# Patient Record
Sex: Female | Born: 1975 | Race: White | Hispanic: No | Smoking: Former smoker
Health system: Southern US, Community
[De-identification: ages and names within clinical notes are randomized; demographics above are authoritative.]

## PROBLEM LIST (undated history)

## (undated) DIAGNOSIS — F329 Major depressive disorder, single episode, unspecified: Secondary | ICD-10-CM

## (undated) DIAGNOSIS — K219 Gastro-esophageal reflux disease without esophagitis: Secondary | ICD-10-CM

## (undated) DIAGNOSIS — F32A Depression, unspecified: Secondary | ICD-10-CM

## (undated) DIAGNOSIS — E119 Type 2 diabetes mellitus without complications: Secondary | ICD-10-CM

## (undated) DIAGNOSIS — F341 Dysthymic disorder: Secondary | ICD-10-CM

## (undated) DIAGNOSIS — T7840XA Allergy, unspecified, initial encounter: Secondary | ICD-10-CM

## (undated) DIAGNOSIS — E785 Hyperlipidemia, unspecified: Secondary | ICD-10-CM

## (undated) DIAGNOSIS — F419 Anxiety disorder, unspecified: Secondary | ICD-10-CM

## (undated) DIAGNOSIS — J45909 Unspecified asthma, uncomplicated: Secondary | ICD-10-CM

## (undated) DIAGNOSIS — I10 Essential (primary) hypertension: Secondary | ICD-10-CM

## (undated) HISTORY — DX: Dysthymic disorder: F34.1

## (undated) HISTORY — PX: ABDOMINAL HYSTERECTOMY: SHX81

## (undated) HISTORY — DX: Hyperlipidemia, unspecified: E78.5

## (undated) HISTORY — DX: Allergy, unspecified, initial encounter: T78.40XA

---

## 2010-06-04 HISTORY — PX: OTHER SURGICAL HISTORY: SHX169

## 2014-06-04 DIAGNOSIS — F341 Dysthymic disorder: Secondary | ICD-10-CM

## 2014-06-04 HISTORY — DX: Dysthymic disorder: F34.1

## 2015-04-25 ENCOUNTER — Encounter: Payer: Self-pay | Admitting: Emergency Medicine

## 2015-04-25 ENCOUNTER — Emergency Department
Admission: EM | Admit: 2015-04-25 | Discharge: 2015-04-25 | Disposition: A | Discharge status: Home / Self Care | Payer: Medicaid - Out of State | Attending: Emergency Medicine | Admitting: Emergency Medicine

## 2015-04-25 ENCOUNTER — Emergency Department: Payer: Medicaid - Out of State

## 2015-04-25 DIAGNOSIS — E119 Type 2 diabetes mellitus without complications: Secondary | ICD-10-CM | POA: Insufficient documentation

## 2015-04-25 DIAGNOSIS — J45901 Unspecified asthma with (acute) exacerbation: Secondary | ICD-10-CM | POA: Insufficient documentation

## 2015-04-25 DIAGNOSIS — J069 Acute upper respiratory infection, unspecified: Secondary | ICD-10-CM | POA: Insufficient documentation

## 2015-04-25 DIAGNOSIS — Z3202 Encounter for pregnancy test, result negative: Secondary | ICD-10-CM | POA: Diagnosis not present

## 2015-04-25 DIAGNOSIS — R05 Cough: Secondary | ICD-10-CM | POA: Diagnosis present

## 2015-04-25 DIAGNOSIS — J45909 Unspecified asthma, uncomplicated: Secondary | ICD-10-CM

## 2015-04-25 HISTORY — DX: Unspecified asthma, uncomplicated: J45.909

## 2015-04-25 HISTORY — DX: Type 2 diabetes mellitus without complications: E11.9

## 2015-04-25 LAB — POCT PREGNANCY, URINE: PREG TEST UR: NEGATIVE

## 2015-04-25 MED ORDER — IPRATROPIUM-ALBUTEROL 0.5-2.5 (3) MG/3ML IN SOLN
3.0000 mL | Freq: Once | RESPIRATORY_TRACT | Status: AC
  Administered 2015-04-25: 3 mL via RESPIRATORY_TRACT
  Filled 2015-04-25: qty 3

## 2015-04-25 MED ORDER — ALBUTEROL SULFATE HFA 108 (90 BASE) MCG/ACT IN AERS: 2.0000 | INHALATION_SPRAY | Freq: Four times a day (QID) | RESPIRATORY_TRACT | Status: AC | PRN

## 2015-04-25 MED ORDER — BENZONATATE 100 MG PO CAPS: 100.0000 mg | ORAL_CAPSULE | Freq: Three times a day (TID) | ORAL | Status: DC | PRN

## 2015-04-25 NOTE — ED Provider Notes (Signed)
ED ECG REPORT I, Jene EveryKINNER, Daymon Hora, the attending physician, personally viewed and interpreted this ECG.  Date: 04/25/2015 EKG Time: 1154 Rate: 83 Rhythm: normal sinus rhythm QRS Axis: normal Intervals: normal ST/T Wave abnormalities: normal Conduction Disutrbances: none Narrative Interpretation: unremarkable   Jene Everyobert Jeily Guthridge, MD 04/25/15 1155

## 2015-04-25 NOTE — ED Notes (Signed)
Pt reports that she began having cough/congestion around midnight last night. Pt reports that she has been having pains in her chest since she began the cough.

## 2015-04-25 NOTE — ED Provider Notes (Signed)
[facility] Emergency Department Provider Note  ____________________________________________  Time seen: Approximately 12:13 PM  I have reviewed the triage vital signs and the nursing notes.   HISTORY  Chief Complaint Cough and Nasal Congestion  HPI Virginia Peterson is a 39 y.o. female is here with complaint of cough and congestion since midnight last night. Patient states she is having pain in her chest since her cough began. Cough is mostly nonproductive. She is unaware of any fever or chills. She is somewhat short of breath. In the past she has had a history of asthma and has been using an inhaler but does not have one at this time. Patient currently is living in this area but only for one week. She does not have a primary care doctor in this area. She states that she is going home to stay for 3 weeks and then will return. She was given information about [facility] and plans to call and make an appointment in the future. Currently patient does not have an inhaler. She denies smoking. Currently she rates her pain 9 out of 10.   Past Medical History  Diagnosis Date  . Diabetes mellitus without complication (HCC)   . Asthma     There are no active problems to display for this patient.   History reviewed. No pertinent past surgical history.  Current Outpatient Rx  Name  Route  Sig  Dispense  Refill  . albuterol (PROVENTIL HFA;VENTOLIN HFA) 108 (90 BASE) MCG/ACT inhaler   Inhalation   Inhale 2 puffs into the lungs every 6 (six) hours as needed for wheezing or shortness of breath.   1 Inhaler   2   . benzonatate (TESSALON PERLES) 100 MG capsule   Oral   Take 1 capsule (100 mg total) by mouth 3 (three) times daily as needed for cough.   30 capsule   0     Allergies Review of patient's allergies indicates no known allergies.  History reviewed. No pertinent family history.  Social History Social History  Substance Use Topics  . Smoking status:  Never Smoker   . Smokeless tobacco: None  . Alcohol Use: Yes    Review of Systems Constitutional: No fever/chills ENT: No sore throat. Cardiovascular: Positive chest pain. Respiratory: Positive shortness of breath. Gastrointestinal: No abdominal pain.  No nausea, no vomiting.   Genitourinary: Negative for dysuria. Musculoskeletal: Negative for back pain. Skin: Negative for rash. Neurological: Negative for headaches, focal weakness or numbness.  10-point ROS otherwise negative.  ____________________________________________   PHYSICAL EXAM:  VITAL SIGNS: ED Triage Vitals  Enc Vitals Group     BP 04/25/15 1148 155/84 mmHg     Pulse Rate 04/25/15 1148 84     Resp 04/25/15 1148 20     Temp 04/25/15 1148 98.1 F (36.7 C)     Temp Source 04/25/15 1148 Oral     SpO2 04/25/15 1148 97 %     Weight 04/25/15 1148 220 lb (99.791 kg)     Height 04/25/15 1148  (1.549 m)     Head Cir --      Peak Flow --      Pain Score 04/25/15 1149 9     Pain Loc --      Pain Edu? --      Excl. in GC? --     Constitutional: Alert and oriented. Well appearing and in no acute distress. Eyes: Conjunctivae are normal. PERRL. EOMI. Head: Atraumatic. Nose: No congestion/rhinnorhea. Mouth/Throat:  Mucous membranes are moist.  Oropharynx non-erythematous. Neck: No stridor.  Supple. Hematological/Lymphatic/Immunilogical: No cervical lymphadenopathy. Cardiovascular: Normal rate, regular rhythm. Grossly normal heart sounds.  Good peripheral circulation. Respiratory: Normal respiratory effort.  No retractions. Lungs poor air exchange is heard but no wheezes or rales noted. Gastrointestinal: Soft and nontender. No distention.  Musculoskeletal: No lower extremity tenderness nor edema.  No joint effusions. Neurologic:  Normal speech and language. No gross focal neurologic deficits are appreciated. No gait instability. Skin:  Skin is warm, dry and intact. No rash noted. Psychiatric: Mood and affect are  normal. Speech and behavior are normal.  ____________________________________________   LABS (all labs ordered are listed, but only abnormal results are displayed)  Labs Reviewed  POC URINE PREG, ED  POCT PREGNANCY, URINE   ____________________________________________  EKG  Per Dr. Cyril LoosenKinner ____________________________________________  RADIOLOGY  Chest x-ray per radiologist shows no acute cardiopulmonary disease. I, Tommi Rumpshonda L Summers, personally viewed and evaluated these images (plain radiographs) as part of my medical decision making.  ____________________________________________   PROCEDURES  Procedure(s) performed: None  Critical Care performed: No  ____________________________________________   INITIAL IMPRESSION / ASSESSMENT AND PLAN / ED COURSE  Pertinent labs & imaging results that were available during my care of the patient were reviewed by me and considered in my medical decision making (see chart for details).  After DuoNeb treatment patient was breathing with decreased cough and feels like she is less tight in her chest. There was still no wheezing noted on exam. Patient was written a prescription for a albuterol inhaler and Tessalon Perles as needed for cough. She is follow-up with her doctor at home. She is instructed to return to the emergency room if any severe worsening of her symptoms.   FINAL CLINICAL IMPRESSION(S) / ED DIAGNOSES  Final diagnoses:  Acute upper respiratory infection  Asthma, unspecified asthma severity, uncomplicated      Tommi RumpsRhonda L Summers, PA-C 04/25/15 1437  Jene Everyobert Kinner, MD 04/25/15 [phone] [id number]

## 2015-04-25 NOTE — ED Notes (Addendum)
Pt to ed with c/o cough, congestion and chest tightness with cough x 2 days.  Pt denies fever, denies body aches. Pt alert and oriented and appears in no respiratory distress.  Pt also reports mild sore throat and bilat earaches.

## 2015-04-25 NOTE — Discharge Instructions (Signed)
Follow up with [facility].  You will need to call for an appointment. Albuterol inhaler as directed

## 2015-05-05 ENCOUNTER — Encounter: Payer: Self-pay | Admitting: General Practice

## 2015-05-05 ENCOUNTER — Emergency Department
Admission: EM | Admit: 2015-05-05 | Discharge: 2015-05-06 | Disposition: A | Discharge status: Home / Self Care | Payer: Medicaid Other | Attending: Emergency Medicine | Admitting: Emergency Medicine

## 2015-05-05 DIAGNOSIS — J209 Acute bronchitis, unspecified: Secondary | ICD-10-CM

## 2015-05-05 DIAGNOSIS — R739 Hyperglycemia, unspecified: Secondary | ICD-10-CM

## 2015-05-05 DIAGNOSIS — R05 Cough: Secondary | ICD-10-CM | POA: Diagnosis present

## 2015-05-05 DIAGNOSIS — J45909 Unspecified asthma, uncomplicated: Secondary | ICD-10-CM | POA: Diagnosis not present

## 2015-05-05 DIAGNOSIS — E1165 Type 2 diabetes mellitus with hyperglycemia: Secondary | ICD-10-CM | POA: Diagnosis not present

## 2015-05-05 DIAGNOSIS — I159 Secondary hypertension, unspecified: Secondary | ICD-10-CM

## 2015-05-05 LAB — GLUCOSE, CAPILLARY: GLUCOSE-CAPILLARY: 254 mg/dL — AB (ref 65–99)

## 2015-05-05 MED ORDER — GLIPIZIDE 10 MG PO TABS: 10.0000 mg | ORAL_TABLET | Freq: Every day | ORAL | Status: DC

## 2015-05-05 MED ORDER — GLIPIZIDE 10 MG PO TABS: 10.0000 mg | ORAL_TABLET | Freq: Two times a day (BID) | ORAL | Status: DC

## 2015-05-05 MED ORDER — METFORMIN HCL 500 MG PO TABS
1000.0000 mg | ORAL_TABLET | Freq: Once | ORAL | Status: AC
  Administered 2015-05-05: 1000 mg via ORAL
  Filled 2015-05-05: qty 2

## 2015-05-05 MED ORDER — AZITHROMYCIN 250 MG PO TABS: 500.0000 mg | ORAL_TABLET | Freq: Every day | ORAL | Status: AC

## 2015-05-05 MED ORDER — LISINOPRIL 10 MG PO TABS
10.0000 mg | ORAL_TABLET | Freq: Once | ORAL | Status: AC
  Administered 2015-05-05: 10 mg via ORAL
  Filled 2015-05-05: qty 1

## 2015-05-05 MED ORDER — GLIPIZIDE 10 MG PO TABS
ORAL_TABLET | ORAL | Status: AC
  Filled 2015-05-05: qty 1

## 2015-05-05 MED ORDER — BENZONATATE 100 MG PO CAPS: 100.0000 mg | ORAL_CAPSULE | Freq: Three times a day (TID) | ORAL | Status: DC | PRN

## 2015-05-05 MED ORDER — ACETAMINOPHEN-CODEINE #3 300-30 MG PO TABS
2.0000 | ORAL_TABLET | Freq: Once | ORAL | Status: AC
  Administered 2015-05-05: 2 via ORAL
  Filled 2015-05-05: qty 2

## 2015-05-05 MED ORDER — LISINOPRIL 10 MG PO TABS: 10.0000 mg | ORAL_TABLET | Freq: Every day | ORAL | Status: DC

## 2015-05-05 MED ORDER — GLIPIZIDE 10 MG PO TABS
10.0000 mg | ORAL_TABLET | Freq: Once | ORAL | Status: AC
  Administered 2015-05-05: 10 mg via ORAL

## 2015-05-05 MED ORDER — METFORMIN HCL 1000 MG PO TABS: 1000.0000 mg | ORAL_TABLET | Freq: Two times a day (BID) | ORAL | Status: DC

## 2015-05-05 MED ORDER — AZITHROMYCIN 250 MG PO TABS
ORAL_TABLET | ORAL | Status: AC
Start: 2015-05-05 — End: 2015-05-05
  Administered 2015-05-05: 500 mg via ORAL
  Filled 2015-05-05: qty 2

## 2015-05-05 MED ORDER — AZITHROMYCIN 250 MG PO TABS
500.0000 mg | ORAL_TABLET | Freq: Every day | ORAL | Status: DC
  Administered 2015-05-05: 500 mg via ORAL

## 2015-05-05 NOTE — Discharge Instructions (Signed)
Acute Bronchitis Bronchitis is inflammation of the airways that extend from the windpipe into the lungs (bronchi). The inflammation often causes mucus to develop. This leads to a cough, which is the most common symptom of bronchitis.  In acute bronchitis, the condition usually develops suddenly and goes away over time, usually in a couple weeks. Smoking, allergies, and asthma can make bronchitis worse. Repeated episodes of bronchitis may cause further lung problems.  CAUSES Acute bronchitis is most often caused by the same virus that causes a cold. The virus can spread from person to person (contagious) through coughing, sneezing, and touching contaminated objects. SIGNS AND SYMPTOMS   Cough.   Fever.   Coughing up mucus.   Body aches.   Chest congestion.   Chills.   Shortness of breath.   Sore throat.  DIAGNOSIS  Acute bronchitis is usually diagnosed through a physical exam. Your health care provider will also ask you questions about your medical history. Tests, such as chest X-rays, are sometimes done to rule out other conditions.  TREATMENT  Acute bronchitis usually goes away in a couple weeks. Oftentimes, no medical treatment is necessary. Medicines are sometimes given for relief of fever or cough. Antibiotic medicines are usually not needed but may be prescribed in certain situations. In some cases, an inhaler may be recommended to help reduce shortness of breath and control the cough. A cool mist vaporizer may also be used to help thin bronchial secretions and make it easier to clear the chest.  HOME CARE INSTRUCTIONS  Get plenty of rest.   Drink enough fluids to keep your urine clear or pale yellow (unless you have a medical condition that requires fluid restriction). Increasing fluids may help thin your respiratory secretions (sputum) and reduce chest congestion, and it will prevent dehydration.   Take medicines only as directed by your health care provider.  If  you were prescribed an antibiotic medicine, finish it all even if you start to feel better.  Avoid smoking and secondhand smoke. Exposure to cigarette smoke or irritating chemicals will make bronchitis worse. If you are a smoker, consider using nicotine gum or skin patches to help control withdrawal symptoms. Quitting smoking will help your lungs heal faster.   Reduce the chances of another bout of acute bronchitis by washing your hands frequently, avoiding people with cold symptoms, and trying not to touch your hands to your mouth, nose, or eyes.   Keep all follow-up visits as directed by your health care provider.  SEEK MEDICAL CARE IF: Your symptoms do not improve after 1 week of treatment.  SEEK IMMEDIATE MEDICAL CARE IF:  You develop an increased fever or chills.   You have chest pain.   You have severe shortness of breath.  You have bloody sputum.   You develop dehydration.  You faint or repeatedly feel like you are going to pass out.  You develop repeated vomiting.  You develop a severe headache. MAKE SURE YOU:   Understand these instructions.  Will watch your condition.  Will get help right away if you are not doing well or get worse.   This information is not intended to replace advice given to you by your health care provider. Make sure you discuss any questions you have with your health care provider.   Document Released: 06/28/2004 Document Revised: 06/11/2014 Document Reviewed: 11/11/2012 Elsevier Interactive Patient Education 2016 [employer] Inc[employer]  Hyperglycemia Hyperglycemia occurs when the glucose (sugar) in your blood is too high. Hyperglycemia can happen for many  reasons, but it most often happens to people who do not know they have diabetes or are not managing their diabetes properly.  CAUSES  Whether you have diabetes or not, there are other causes of hyperglycemia. Hyperglycemia can occur when you have diabetes, but it can also occur in other  situations that you might not be as aware of, such as: Diabetes  If you have diabetes and are having problems controlling your blood glucose, hyperglycemia could occur because of some of the following reasons:  Not following your meal plan.  Not taking your diabetes medications or not taking it properly.  Exercising less or doing less activity than you normally do.  Being sick. Pre-diabetes  This cannot be ignored. Before people develop Type 2 diabetes, they almost always have "pre-diabetes." This is when your blood glucose levels are higher than normal, but not yet high enough to be diagnosed as diabetes. Research has shown that some long-term damage to the body, especially the heart and circulatory system, may already be occurring during pre-diabetes. If you take action to manage your blood glucose when you have pre-diabetes, you may delay or prevent Type 2 diabetes from developing. Stress  If you have diabetes, you may be "diet" controlled or on oral medications or insulin to control your diabetes. However, you may find that your blood glucose is higher than usual in the hospital whether you have diabetes or not. This is often referred to as "stress hyperglycemia." Stress can elevate your blood glucose. This happens because of hormones put out by the body during times of stress. If stress has been the cause of your high blood glucose, it can be followed regularly by your caregiver. That way he/she can make sure your hyperglycemia does not continue to get worse or progress to diabetes. Steroids  Steroids are medications that act on the infection fighting system (immune system) to block inflammation or infection. One side effect can be a rise in blood glucose. Most people can produce enough extra insulin to allow for this rise, but for those who cannot, steroids make blood glucose levels go even higher. It is not unusual for steroid treatments to "uncover" diabetes that is developing. It is not  always possible to determine if the hyperglycemia will go away after the steroids are stopped. A special blood test called an A1c is sometimes done to determine if your blood glucose was elevated before the steroids were started. SYMPTOMS  Thirsty.  Frequent urination.  Dry mouth.  Blurred vision.  Tired or fatigue.  Weakness.  Sleepy.  Tingling in feet or leg. DIAGNOSIS  Diagnosis is made by monitoring blood glucose in one or all of the following ways:  A1c test. This is a chemical found in your blood.  Fingerstick blood glucose monitoring.  Laboratory results. TREATMENT  First, knowing the cause of the hyperglycemia is important before the hyperglycemia can be treated. Treatment may include, but is not be limited to:  Education.  Change or adjustment in medications.  Change or adjustment in meal plan.  Treatment for an illness, infection, etc.  More frequent blood glucose monitoring.  Change in exercise plan.  Decreasing or stopping steroids.  Lifestyle changes. HOME CARE INSTRUCTIONS   Test your blood glucose as directed.  Exercise regularly. Your caregiver will give you instructions about exercise. Pre-diabetes or diabetes which comes on with stress is helped by exercising.  Eat wholesome, balanced meals. Eat often and at regular, fixed times. Your caregiver or nutritionist will give you a  meal plan to guide your sugar intake.  Being at an ideal weight is important. If needed, losing as little as 10 to 15 pounds may help improve blood glucose levels. SEEK MEDICAL CARE IF:   You have questions about medicine, activity, or diet.  You continue to have symptoms (problems such as increased thirst, urination, or weight gain). SEEK IMMEDIATE MEDICAL CARE IF:   You are vomiting or have diarrhea.  Your breath smells fruity.  You are breathing faster or slower.  You are very sleepy or incoherent.  You have numbness, tingling, or pain in your feet or  hands.  You have chest pain.  Your symptoms get worse even though you have been following your caregiver's orders.  If you have any other questions or concerns.   This information is not intended to replace advice given to you by your health care provider. Make sure you discuss any questions you have with your health care provider.   Document Released: 11/14/2000 Document Revised: 08/13/2011 Document Reviewed: 01/25/2015 Elsevier Interactive Patient Education 2016 [employer]  Hypertension Hypertension, commonly called high blood pressure, is when the force of blood pumping through your arteries is too strong. Your arteries are the blood vessels that carry blood from your heart throughout your body. A blood pressure reading consists of a higher number over a lower number, such as 110/72. The higher number (systolic) is the pressure inside your arteries when your heart pumps. The lower number (diastolic) is the pressure inside your arteries when your heart relaxes. Ideally you want your blood pressure below 120/80. Hypertension forces your heart to work harder to pump blood. Your arteries may become narrow or stiff. Having untreated or uncontrolled hypertension can cause heart attack, stroke, kidney disease, and other problems. RISK FACTORS Some risk factors for high blood pressure are controllable. Others are not.  Risk factors you cannot control include:   Race. You may be at higher risk if you are African American.  Age. Risk increases with age.  Gender. Men are at higher risk than women before age 39 years. After age 39, women are at higher risk than men. Risk factors you can control include:  Not getting enough exercise or physical activity.  Being overweight.  Getting too much fat, sugar, calories, or salt in your diet.  Drinking too much alcohol. SIGNS AND SYMPTOMS Hypertension does not usually cause signs or symptoms. Extremely high blood pressure (hypertensive crisis)  may cause headache, anxiety, shortness of breath, and nosebleed. DIAGNOSIS To check if you have hypertension, your health care provider will measure your blood pressure while you are seated, with your arm held at the level of your heart. It should be measured at least twice using the same arm. Certain conditions can cause a difference in blood pressure between your right and left arms. A blood pressure reading that is higher than normal on one occasion does not mean that you need treatment. If it is not clear whether you have high blood pressure, you may be asked to return on a different day to have your blood pressure checked again. Or, you may be asked to monitor your blood pressure at home for 1 or more weeks. TREATMENT Treating high blood pressure includes making lifestyle changes and possibly taking medicine. Living a healthy lifestyle can help lower high blood pressure. You may need to change some of your habits. Lifestyle changes may include:  Following the DASH diet. This diet is high in fruits, vegetables, and whole grains. It is  low in salt, red meat, and added sugars.  Keep your sodium intake below 2,300 mg per day.  Getting at least 30-45 minutes of aerobic exercise at least 4 times per week.  Losing weight if necessary.  Not smoking.  Limiting alcoholic beverages.  Learning ways to reduce stress. Your health care provider may prescribe medicine if lifestyle changes are not enough to get your blood pressure under control, and if one of the following is true:  You are 25-26 years of age and your systolic blood pressure is above 140.  You are 34 years of age or older, and your systolic blood pressure is above 150.  Your diastolic blood pressure is above 90.  You have diabetes, and your systolic blood pressure is over 140 or your diastolic blood pressure is over 90.  You have kidney disease and your blood pressure is above 140/90.  You have heart disease and your blood  pressure is above 140/90. Your personal target blood pressure may vary depending on your medical conditions, your age, and other factors. HOME CARE INSTRUCTIONS  Have your blood pressure rechecked as directed by your health care provider.   Take medicines only as directed by your health care provider. Follow the directions carefully. Blood pressure medicines must be taken as prescribed. The medicine does not work as well when you skip doses. Skipping doses also puts you at risk for problems.  Do not smoke.   Monitor your blood pressure at home as directed by your health care provider. SEEK MEDICAL CARE IF:   You think you are having a reaction to medicines taken.  You have recurrent headaches or feel dizzy.  You have swelling in your ankles.  You have trouble with your vision. SEEK IMMEDIATE MEDICAL CARE IF:  You develop a severe headache or confusion.  You have unusual weakness, numbness, or feel faint.  You have severe chest or abdominal pain.  You vomit repeatedly.  You have trouble breathing. MAKE SURE YOU:   Understand these instructions.  Will watch your condition.  Will get help right away if you are not doing well or get worse.   This information is not intended to replace advice given to you by your health care provider. Make sure you discuss any questions you have with your health care provider.   Document Released: 05/21/2005 Document Revised: 10/05/2014 Document Reviewed: 03/13/2013 Elsevier Interactive Patient Education [employer]

## 2015-05-05 NOTE — ED Notes (Signed)

## 2015-05-05 NOTE — ED Notes (Addendum)
Pt to ED for persistent cough and congestion for over 1 week with Tessalon and Albuterol therapy. [place] been [place] from [place] and is without her BP meds.

## 2015-05-05 NOTE — ED Notes (Signed)
MD at bedside. 

## 2015-05-05 NOTE — ED Provider Notes (Signed)
[facility] Emergency Department Provider Note  ____________________________________________  Time seen: 11:10 PM  I have reviewed the triage vital signs and the nursing notes.   HISTORY  Chief Complaint Cough and Nasal Congestion    HPI Virginia Peterson is a 39 y.o. female presents with history of persistent cough and congestion times one week that is unrelieved with Tessalon and albuterol therapy. Patient states that she is visiting from [place] and is also out of multiple of her medications including her glipizide metformin and lisinopril. Patient states that her last dose of those medications was August. Patient denies any fever and was afebrile on presentation to emergency department with a temperature 90.8     Past Medical History  Diagnosis Date  . Diabetes mellitus without complication (HCC)   . Asthma     There are no active problems to display for this patient.   History reviewed. No pertinent past surgical history.  Current Outpatient Rx  Name  Route  Sig  Dispense  Refill  . albuterol (PROVENTIL HFA;VENTOLIN HFA) 108 (90 BASE) MCG/ACT inhaler   Inhalation   Inhale 2 puffs into the lungs every 6 (six) hours as needed for wheezing or shortness of breath.   1 Inhaler   2   . benzonatate (TESSALON PERLES) 100 MG capsule   Oral   Take 1 capsule (100 mg total) by mouth 3 (three) times daily as needed for cough.   30 capsule   0     Allergies Review of patient's allergies indicates no known allergies.  History reviewed. No pertinent family history.  Social History Social History  Substance Use Topics  . Smoking status: Never Smoker   . Smokeless tobacco: None  . Alcohol Use: Yes    Review of Systems  Constitutional: Negative for fever. Eyes: Negative for visual changes. ENT: Negative for sore throat. Cardiovascular: Negative for chest pain. Respiratory: Positive for cough and congestion Gastrointestinal: Negative for  abdominal pain, vomiting and diarrhea. Genitourinary: Negative for dysuria. Musculoskeletal: Negative for back pain. Skin: Negative for rash. Neurological: Negative for headaches, focal weakness or numbness.   10-point ROS otherwise negative.  ____________________________________________   PHYSICAL EXAM:  VITAL SIGNS: ED Triage Vitals  Enc Vitals Group     BP 05/05/15 1949 205/117 mmHg     Pulse Rate 05/05/15 1949 98     Resp 05/05/15 1949 20     Temp 05/05/15 1949 98 F (36.7 C)     Temp Source 05/05/15 1949 Oral     SpO2 05/05/15 1949 99 %     Weight 05/05/15 1949 220 lb (99.791 kg)     Height 05/05/15 1949 5\' 1"  (1.549 m)     Head Cir --      Peak Flow --      Pain Score 05/05/15 1951 9     Pain Loc --      Pain Edu? --      Excl. in GC? --      Constitutional: Alert and oriented. Well appearing and in no distress. Eyes: Conjunctivae are normal. PERRL. Normal extraocular movements. ENT   Head: Normocephalic and atraumatic.   Nose: No congestion/rhinnorhea.   Mouth/Throat: Mucous membranes are moist.   Neck: No stridor. Hematological/Lymphatic/Immunilogical: No cervical lymphadenopathy. Cardiovascular: Normal rate, regular rhythm. Normal and symmetric distal pulses are present in all extremities. No murmurs, rubs, or gallops. Respiratory: Normal respiratory effort without tachypnea nor retractions. Breath sounds are clear and equal bilaterally. No wheezes/rales/rhonchi. Gastrointestinal: Soft  and nontender. No distention. There is no CVA tenderness. Genitourinary: deferred Musculoskeletal: Nontender with normal range of motion in all extremities. No joint effusions.  No lower extremity tenderness nor edema. Neurologic:  Normal speech and language. No gross focal neurologic deficits are appreciated. Speech is normal.  Skin:  Skin is warm, dry and intact. No rash noted. Psychiatric: Mood and affect are normal. Speech and behavior are normal. Patient  exhibits appropriate insight and judgment.    INITIAL IMPRESSION / ASSESSMENT AND PLAN / ED COURSE  Pertinent labs & imaging results that were available during my care of the patient were reviewed by me and considered in my medical decision making (see chart for details).  Patient's glucose 254 she received glipizide 10 mg and metformin 1000 mg her prescribed dosages. In addition patient received lisinopril 10 mg tablet. Patient was prescribed MEDICATIONS upon discharge.  ____________________________________________   FINAL CLINICAL IMPRESSION(S) / ED DIAGNOSES  Final diagnoses:  Acute bronchitis, unspecified organism  Hyperglycemia  Secondary hypertension, unspecified      Darci Current, MD 05/06/15 [phone]

## 2015-07-21 ENCOUNTER — Emergency Department
Admission: EM | Admit: 2015-07-21 | Discharge: 2015-07-21 | Disposition: A | Discharge status: Home / Self Care | Payer: Medicaid Other | Attending: Emergency Medicine | Admitting: Emergency Medicine

## 2015-07-21 ENCOUNTER — Encounter: Payer: Self-pay | Admitting: *Deleted

## 2015-07-21 DIAGNOSIS — Z79899 Other long term (current) drug therapy: Secondary | ICD-10-CM | POA: Insufficient documentation

## 2015-07-21 DIAGNOSIS — M545 Low back pain: Secondary | ICD-10-CM | POA: Diagnosis not present

## 2015-07-21 DIAGNOSIS — E119 Type 2 diabetes mellitus without complications: Secondary | ICD-10-CM | POA: Diagnosis not present

## 2015-07-21 DIAGNOSIS — G8929 Other chronic pain: Secondary | ICD-10-CM | POA: Diagnosis not present

## 2015-07-21 DIAGNOSIS — Z7984 Long term (current) use of oral hypoglycemic drugs: Secondary | ICD-10-CM | POA: Insufficient documentation

## 2015-07-21 HISTORY — DX: Anxiety disorder, unspecified: F41.9

## 2015-07-21 HISTORY — DX: Major depressive disorder, single episode, unspecified: F32.9

## 2015-07-21 HISTORY — DX: Depression, unspecified: F32.A

## 2015-07-21 MED ORDER — MELOXICAM 15 MG PO TABS: 15.0000 mg | ORAL_TABLET | Freq: Every day | ORAL | Status: DC

## 2015-07-21 MED ORDER — BACLOFEN 10 MG PO TABS: 10.0000 mg | ORAL_TABLET | Freq: Three times a day (TID) | ORAL | Status: DC

## 2015-07-21 NOTE — ED Provider Notes (Signed)
[facility] Emergency Department Provider Note  ____________________________________________  Time seen: Approximately 1:54 PM  I have reviewed the triage vital signs and the nursing notes.   HISTORY  Chief Complaint Back Pain    HPI Virginia Peterson is a 40 y.o. female , NAD, presents to the emergency department with chronic lower back pain. States she has recently moved to [place] from [place] to live with her mother. Has been taking tramadol for her bulging disc in her lower back which was prescribed by her primary care physician in [place]. States she tried to you refill and such but was denied by that physician. States she feels a "swelling" in her lower back. Has had no fevers, chills, body aches. Denies numbness, weakness, tingling. Denies any saddle paresthesias. Has had no loss of bowel or bladder control. Notes she is sleeping on a pullout couch and the metal bar supporting the mattress pushes into her back. States she is in the process of filing for Medicaid and disability in the state of [place]. Currently has [place] health care insurance through the end of this month and would like to establish with a primary care provider before that time.   Past Medical History  Diagnosis Date  . Diabetes mellitus without complication (HCC)   . Asthma   . Depressed   . Anxiety     There are no active problems to display for this patient.   History reviewed. No pertinent past surgical history.  Current Outpatient Rx  Name  Route  Sig  Dispense  Refill  . albuterol (PROVENTIL HFA;VENTOLIN HFA) 108 (90 BASE) MCG/ACT inhaler   Inhalation   Inhale 2 puffs into the lungs every 6 (six) hours as needed for wheezing or shortness of breath.   1 Inhaler   2   . baclofen (LIORESAL) 10 MG tablet   Oral   Take 1 tablet (10 mg total) by mouth 3 (three) times daily.   60 tablet   0   . benzonatate (TESSALON PERLES) 100 MG capsule   Oral   Take 1  capsule (100 mg total) by mouth 3 (three) times daily as needed for cough.   30 capsule   0   . glipiZIDE (GLUCOTROL) 10 MG tablet   Oral   Take 1 tablet (10 mg total) by mouth 2 (two) times daily before a meal.   180 tablet   0   . lisinopril (PRINIVIL,ZESTRIL) 10 MG tablet   Oral   Take 1 tablet (10 mg total) by mouth daily.   90 tablet   0   . meloxicam (MOBIC) 15 MG tablet   Oral   Take 1 tablet (15 mg total) by mouth daily.   30 tablet   0   . metFORMIN (GLUCOPHAGE) 1000 MG tablet   Oral   Take 1 tablet (1,000 mg total) by mouth 2 (two) times daily with a meal.   180 tablet   0     Allergies Review of patient's allergies indicates no known allergies.  History reviewed. No pertinent family history.  Social History Social History  Substance Use Topics  . Smoking status: Never Smoker   . Smokeless tobacco: None  . Alcohol Use: Yes     Review of Systems  Constitutional: No fever/chills, fatigue Eyes: No visual changes.  Cardiovascular: No chest pain. Respiratory: No shortness of breath. No wheezing.  Gastrointestinal: No abdominal pain.  No nausea, vomiting.  No diarrhea.  No constipation. Genitourinary:  Negative for dysuria. No hematuria. No urinary hesitancy, urgency or increased frequency. Musculoskeletal: Positive for back pain. Negative for myalgias Skin: Negative for rash, skin sores. Neurological: Negative for headaches, focal weakness or numbness. 10-point ROS otherwise negative.  ____________________________________________   PHYSICAL EXAM:  VITAL SIGNS: ED Triage Vitals  Enc Vitals Group     BP 07/21/15 1201 163/69 mmHg     Pulse Rate 07/21/15 1201 94     Resp 07/21/15 1201 18     Temp 07/21/15 1201 97.9 F (36.6 C)     Temp Source 07/21/15 1201 Oral     SpO2 07/21/15 1201 98 %     Weight 07/21/15 1201 220 lb (99.791 kg)     Height 07/21/15 1201  (1.549 m)     Head Cir --      Peak Flow --      Pain Score 07/21/15 1201 10      Pain Loc --      Pain Edu? --      Excl. in GC? --     Constitutional: Alert and oriented. Well appearing and in no acute distress. Patient sitting in the exam bed talking with her family at the bedside.  Eyes: Conjunctivae are normal. PERRL. EOMI without pain.  Head: Atraumatic. Cardiovascular:  Good peripheral circulation. Respiratory: Normal respiratory effort without tachypnea or retractions.  Gastrointestinal: Soft and nontender. No distention. No CVA tenderness. Musculoskeletal: Muscle spasm appreciated about left lower back. Diffuse lumbar tenderness to palpation without step offs. No lower extremity tenderness nor edema.  No joint effusions. Neurologic:  Normal speech and language. No gross focal neurologic deficits are appreciated.  Skin:  Skin is warm, dry and intact. No rash or skin lesions noted. Psychiatric: Mood and affect are normal. Speech and behavior are normal. Patient exhibits appropriate insight and judgement.   ____________________________________________   LABS  None  ____________________________________________  EKG  None ____________________________________________  RADIOLOGY  None  ____________________________________________    PROCEDURES  Procedure(s) performed: None    Medications - No data to display   ____________________________________________   INITIAL IMPRESSION / ASSESSMENT AND PLAN / ED COURSE  Patient's diagnosis is consistent with chronic lower back pain with muscle spasm. Patient will be discharged home with prescriptions for baclofen 10 mg to take one tablet by mouth 3 times daily as needed as well as meloxicam 15 mg tablets take 1 tablet by mouth daily to decrease inflammation and pain. Considering the patient has consistent and reliable transportation, gave her the name and number to a walk-in primary care facility in [place] which the patient states she would go to establish care.This facility will  also work with her on a self pay basis also provide walk-in services. She is in agreement with this plan. Patient is given ED precautions to return to the ED for any worsening or new symptoms.      ____________________________________________  FINAL CLINICAL IMPRESSION(S) / ED DIAGNOSES  Final diagnoses:  Chronic lower back pain      NEW MEDICATIONS STARTED DURING THIS VISIT:  New Prescriptions   BACLOFEN (LIORESAL) 10 MG TABLET    Take 1 tablet (10 mg total) by mouth 3 (three) times daily.   MELOXICAM (MOBIC) 15 MG TABLET    Take 1 tablet (15 mg total) by mouth daily.         Hope Pigeon, PA-C 07/21/15 1419  Jennye Moccasin, MD 07/21/15 [phone]

## 2015-07-21 NOTE — ED Notes (Signed)
States back spasms, states she was given tramadol for her back around [place] in [place] and wants a refill but her MD will not refill the RX because she is now in [place]

## 2015-07-21 NOTE — ED Notes (Signed)
States she has a hx of lower back pain. Denies recent injury and ambulates well to treatment room. States she feels like her back is swollen and is not able to rest at night. Has tried Ibu and tramadol for pain but states this doesn't help

## 2015-07-21 NOTE — Discharge Instructions (Signed)

## 2015-07-28 ENCOUNTER — Ambulatory Visit: Payer: BLUE CROSS/BLUE SHIELD

## 2015-08-04 ENCOUNTER — Emergency Department
Admission: EM | Admit: 2015-08-04 | Discharge: 2015-08-04 | Disposition: A | Discharge status: Home / Self Care | Payer: BLUE CROSS/BLUE SHIELD | Attending: Emergency Medicine | Admitting: Emergency Medicine

## 2015-08-04 ENCOUNTER — Emergency Department: Payer: BLUE CROSS/BLUE SHIELD

## 2015-08-04 ENCOUNTER — Encounter: Payer: Self-pay | Admitting: *Deleted

## 2015-08-04 DIAGNOSIS — E119 Type 2 diabetes mellitus without complications: Secondary | ICD-10-CM | POA: Insufficient documentation

## 2015-08-04 DIAGNOSIS — Z791 Long term (current) use of non-steroidal anti-inflammatories (NSAID): Secondary | ICD-10-CM | POA: Insufficient documentation

## 2015-08-04 DIAGNOSIS — M545 Low back pain: Secondary | ICD-10-CM | POA: Insufficient documentation

## 2015-08-04 DIAGNOSIS — Z79899 Other long term (current) drug therapy: Secondary | ICD-10-CM | POA: Insufficient documentation

## 2015-08-04 DIAGNOSIS — M549 Dorsalgia, unspecified: Secondary | ICD-10-CM

## 2015-08-04 DIAGNOSIS — Z7984 Long term (current) use of oral hypoglycemic drugs: Secondary | ICD-10-CM | POA: Insufficient documentation

## 2015-08-04 DIAGNOSIS — G8929 Other chronic pain: Secondary | ICD-10-CM | POA: Insufficient documentation

## 2015-08-04 MED ORDER — CYCLOBENZAPRINE HCL 10 MG PO TABS: 10.0000 mg | ORAL_TABLET | Freq: Three times a day (TID) | ORAL | Status: DC | PRN

## 2015-08-04 MED ORDER — HYDROCODONE-ACETAMINOPHEN 5-325 MG PO TABS
1.0000 | ORAL_TABLET | Freq: Once | ORAL | Status: AC
  Administered 2015-08-04: 1 via ORAL
  Filled 2015-08-04: qty 1

## 2015-08-04 MED ORDER — CYCLOBENZAPRINE HCL 10 MG PO TABS
5.0000 mg | ORAL_TABLET | Freq: Once | ORAL | Status: AC
  Administered 2015-08-04: 5 mg via ORAL
  Filled 2015-08-04: qty 2

## 2015-08-04 MED ORDER — HYDROCODONE-ACETAMINOPHEN 5-325 MG PO TABS: 1.0000 | ORAL_TABLET | ORAL | Status: DC | PRN

## 2015-08-04 MED ORDER — NAPROXEN 500 MG PO TABS: 500.0000 mg | ORAL_TABLET | Freq: Two times a day (BID) | ORAL | Status: DC

## 2015-08-04 NOTE — ED Notes (Signed)
States hx of chronic back pain, denies any new injury, states lower back pain today shooting up her back

## 2015-08-04 NOTE — ED Provider Notes (Signed)
[facility] Emergency Department Provider Note  ____________________________________________  Time seen: Approximately 3:50 PM  I have reviewed the triage vital signs and the nursing notes.   HISTORY  Chief Complaint Back Pain    HPI Virginia Peterson is a 40 y.o. female with history of diabetes, asthma, depression and chronic back pain who [place]. She is still trying to obtain insurance. She was seen naproxen 2 weeks ago for same complaint. She has low back pain on the left with some radiation superiorly. No radiation into the legs. She reports a history of bulging disc according to MRI about 5 years ago. She denies fevers or chills, abdominal pain, urinary symptoms such as dysuria or frequency. She is a diabetic and monitors her sugar daily. Her back pain is worse with movement. She has tried baclofen, and Mobic. No chest pain, shortness of breath, headache. She denies nausea, vomiting, diarrhea or constipation.   Past Medical History  Diagnosis Date  . Diabetes mellitus without complication (HCC)   . Asthma   . Depressed   . Anxiety     There are no active problems to display for this patient.   History reviewed. No pertinent past surgical history.  Current Outpatient Rx  Name  Route  Sig  Dispense  Refill  . albuterol (PROVENTIL HFA;VENTOLIN HFA) 108 (90 BASE) MCG/ACT inhaler   Inhalation   Inhale 2 puffs into the lungs every 6 (six) hours as needed for wheezing or shortness of breath.   1 Inhaler   2   . baclofen (LIORESAL) 10 MG tablet   Oral   Take 1 tablet (10 mg total) by mouth 3 (three) times daily.   60 tablet   0   . benzonatate (TESSALON PERLES) 100 MG capsule   Oral   Take 1 capsule (100 mg total) by mouth 3 (three) times daily as needed for cough.   30 capsule   0   . cyclobenzaprine (FLEXERIL) 10 MG tablet   Oral   Take 1 tablet (10 mg total) by mouth every 8 (eight) hours as needed for  muscle spasms.   30 tablet   1   . glipiZIDE (GLUCOTROL) 10 MG tablet   Oral   Take 1 tablet (10 mg total) by mouth 2 (two) times daily before a meal.   180 tablet   0   . HYDROcodone-acetaminophen (NORCO) 5-325 MG tablet   Oral   Take 1 tablet by mouth every 4 (four) hours as needed for moderate pain.   20 tablet   0   . lisinopril (PRINIVIL,ZESTRIL) 10 MG tablet   Oral   Take 1 tablet (10 mg total) by mouth daily.   90 tablet   0   . meloxicam (MOBIC) 15 MG tablet   Oral   Take 1 tablet (15 mg total) by mouth daily.   30 tablet   0   . metFORMIN (GLUCOPHAGE) 1000 MG tablet   Oral   Take 1 tablet (1,000 mg total) by mouth 2 (two) times daily with a meal.   180 tablet   0   . naproxen (NAPROSYN) 500 MG tablet   Oral   Take 1 tablet (500 mg total) by mouth 2 (two) times daily with a meal.   60 tablet   0     Allergies Review of patient's allergies indicates no known allergies.  History reviewed. No pertinent family history.  Social History Social History  Substance Use Topics  . Smoking status: Never Smoker   . Smokeless tobacco: None  . Alcohol Use: Yes    Review of Systems Constitutional: No fever/chills Eyes: No visual changes. ENT: No sore throat. Cardiovascular: Denies chest pain. Respiratory: Denies shortness of breath. Gastrointestinal: No abdominal pain.  No nausea, no vomiting.  No diarrhea.  No constipation. Genitourinary: Negative for dysuria. Musculoskeletal: Negative for back pain. Skin: Negative for rash. Neurological: Negative for headaches, focal weakness or numbness. 10-point ROS otherwise negative.  ____________________________________________   PHYSICAL EXAM:  VITAL SIGNS: ED Triage Vitals  Enc Vitals Group     BP 08/04/15 1358 151/80 mmHg     Pulse Rate 08/04/15 1358 86     Resp 08/04/15 1358 18     Temp 08/04/15 1358 98.1 F (36.7 C)     Temp Source 08/04/15 1358 Oral     SpO2 08/04/15 1358 96 %     Weight 08/04/15  1358 218 lb (98.884 kg)     Height 08/04/15 1358  (1.549 m)     Head Cir --      Peak Flow --      Pain Score 08/04/15 1358 10     Pain Loc --      Pain Edu? --      Excl. in GC? --     Constitutional: Alert and oriented. Well appearing and in no acute distress. Eyes: Conjunctivae are normal. PERRL. EOMI. Ears:  Clear with normal landmarks. No erythema. Head: Atraumatic. Nose: No congestion/rhinnorhea. Mouth/Throat: Mucous membranes are moist.  Neck:  Supple.  No adenopathy.   Cardiovascular: Normal rate, regular rhythm. Grossly normal heart sounds.  Good peripheral circulation. Respiratory: Normal respiratory effort.  No retractions. Lungs CTAB. Musculoskeletal: Nml ROM of upper and lower extremity joints.     tender over the lumbar spine and paraspinal muscles on the left.  rom intact.  Neg SLR bilateral.   Neurologic:  Normal speech and language. No gross focal neurologic deficits are appreciated. No gait instability. Skin:  Skin is warm, dry and intact. No rash noted. Psychiatric: Mood and affect are normal. Speech and behavior are normal.  ____________________________________________   LABS (all labs ordered are listed, but only abnormal results are displayed)  Labs Reviewed - No data to display ____________________________________________  EKG   ____________________________________________  RADIOLOGY  CLINICAL DATA: Chronic low back pain.  EXAM: LUMBAR SPINE - 2-3 VIEW  COMPARISON: None.  FINDINGS: There is no evidence of lumbar spine fracture. Alignment is normal. Intervertebral disc spaces are maintained. Visualized paravertebral soft tissues are unremarkable.  IMPRESSION: Negative.   Electronically Signed  By: Bary Richard M.D.  On: 08/04/2015 16:03 ____________________________________________   PROCEDURES  Procedure(s) performed: None  Critical Care performed: No  ____________________________________________   INITIAL  IMPRESSION / ASSESSMENT AND PLAN / ED COURSE  Pertinent labs & imaging results that were available during my care of the patient were reviewed by me and considered in my medical decision making (see chart for details).  40 year old with history of chronic back pain who presents with worsening left lower back pain. Was seen here 2 weeks ago. X-rays obtained today showing stable lumbar spine. She currently does not have insurance and does not have a primary physician. Given naproxen, Flexeril, Norco. Given referral to orthopedics. Encouraged close follow-up. She will return to the emergency room for worsening symptoms. ____________________________________________   FINAL CLINICAL IMPRESSION(S) / ED DIAGNOSES  Final diagnoses:  Back pain, chronic  Ignacia Bayley, PA-C 08/04/15 1630  Arnaldo Natal, MD 08/04/15 [phone]

## 2015-08-04 NOTE — Discharge Instructions (Signed)
Back Pain, Adult Back pain is very common in adults.The cause of back pain is rarely dangerous and the pain often gets better over time.The cause of your back pain may not be known. Some common causes of back pain include: 1. Strain of the muscles or ligaments supporting the spine. 2. Wear and tear (degeneration) of the spinal disks. 3. Arthritis. 4. Direct injury to the back. For many people, back pain may return. Since back pain is rarely dangerous, most people can learn to manage this condition on their own. HOME CARE INSTRUCTIONS Watch your back pain for any changes. The following actions may help to lessen any discomfort you are feeling: 1. Remain active. It is stressful on your back to sit or stand in one place for long periods of time. Do not sit, drive, or stand in one place for more than 30 minutes at a time. Take short walks on even surfaces as soon as you are able.Try to increase the length of time you walk each day. 2. Exercise regularly as directed by your health care provider. Exercise helps your back heal faster. It also helps avoid future injury by keeping your muscles strong and flexible. 3. Do not stay in bed.Resting more than 1-2 days can delay your recovery. 4. Pay attention to your body when you bend and lift. The most comfortable positions are those that put less stress on your recovering back. Always use proper lifting techniques, including: 1. Bending your knees. 2. Keeping the load close to your body. 3. Avoiding twisting. 5. Find a comfortable position to sleep. Use a firm mattress and lie on your side with your knees slightly bent. If you lie on your back, put a pillow under your knees. 6. Avoid feeling anxious or stressed.Stress increases muscle tension and can worsen back pain.It is important to recognize when you are anxious or stressed and learn ways to manage it, such as with exercise. 7. Take medicines only as directed by your health care provider.  Over-the-counter medicines to reduce pain and inflammation are often the most helpful.Your health care provider may prescribe muscle relaxant drugs.These medicines help dull your pain so you can more quickly return to your normal activities and healthy exercise. 8. Apply ice to the injured area: 1. Put ice in a plastic bag. 2. Place a towel between your skin and the bag. 3. Leave the ice on for 20 minutes, 2-3 times a day for the first 2-3 days. After that, ice and heat may be alternated to reduce pain and spasms. 9. Maintain a healthy weight. Excess weight puts extra stress on your back and makes it difficult to maintain good posture. SEEK MEDICAL CARE IF: 1. You have pain that is not relieved with rest or medicine. 2. You have increasing pain going down into the legs or buttocks. 3. You have pain that does not improve in one week. 4. You have night pain. 5. You lose weight. 6. You have a fever or chills. SEEK IMMEDIATE MEDICAL CARE IF:  1. You develop new bowel or bladder control problems. 2. You have unusual weakness or numbness in your arms or legs. 3. You develop nausea or vomiting. 4. You develop abdominal pain. 5. You feel faint.   This information is not intended to replace advice given to you by your health care provider. Make sure you discuss any questions you have with your health care provider.   Document Released: 05/21/2005 Document Revised: 06/11/2014 Document Reviewed: 09/22/2013 Elsevier Interactive Patient Education 2016 [employer]  Chronic Back Pain  When back pain lasts longer than 3 months, it is called chronic back pain.People with chronic back pain often go through certain periods that are more intense (flare-ups).  CAUSES Chronic back pain can be caused by wear and tear (degeneration) on different structures in your back. These structures include: 5. The bones of your spine (vertebrae) and the joints surrounding your spinal cord and nerve roots  (facets). 6. The strong, fibrous tissues that connect your vertebrae (ligaments). Degeneration of these structures may result in pressure on your nerves. This can lead to constant pain. HOME CARE INSTRUCTIONS 10. Avoid bending, heavy lifting, prolonged sitting, and activities which make the problem worse. 11. Take brief periods of rest throughout the day to reduce your pain. Lying down or standing usually is better than sitting while you are resting. 12. Take over-the-counter or prescription medicines only as directed by your caregiver. SEEK IMMEDIATE MEDICAL CARE IF:  7. You have weakness or numbness in one of your legs or feet. 8. You have trouble controlling your bladder or bowels. 9. You have nausea, vomiting, abdominal pain, shortness of breath, or fainting.   This information is not intended to replace advice given to you by your health care provider. Make sure you discuss any questions you have with your health care provider.   Document Released: 06/28/2004 Document Revised: 08/13/2011 Document Reviewed: 11/08/2014 Elsevier Interactive Patient Education 2016 [employer]  Back Exercises The following exercises strengthen the muscles that help to support the back. They also help to keep the lower back flexible. Doing these exercises can help to prevent back pain or lessen existing pain. If you have back pain or discomfort, try doing these exercises 2-3 times each day or as told by your health care provider. When the pain goes away, do them once each day, but increase the number of times that you repeat the steps for each exercise (do more repetitions). If you do not have back pain or discomfort, do these exercises once each day or as told by your health care provider. EXERCISES Single Knee to Chest Repeat these steps 3-5 times for each leg: 7. Lie on your back on a firm bed or the floor with your legs extended. 8. Bring one knee to your chest. Your other leg should stay extended and  in contact with the floor. 9. Hold your knee in place by grabbing your knee or thigh. 10. Pull on your knee until you feel a gentle stretch in your lower back. 11. Hold the stretch for 10-30 seconds. 12. Slowly release and straighten your leg. Pelvic Tilt Repeat these steps 5-10 times: 13. Lie on your back on a firm bed or the floor with your legs extended. 14. Bend your knees so they are pointing toward the ceiling and your feet are flat on the floor. 15. Tighten your lower abdominal muscles to press your lower back against the floor. This motion will tilt your pelvis so your tailbone points up toward the ceiling instead of pointing to your feet or the floor. 16. With gentle tension and even breathing, hold this position for 5-10 seconds. Cat-Cow Repeat these steps until your lower back becomes more flexible: 10. Get into a hands-and-knees position on a firm surface. Keep your hands under your shoulders, and keep your knees under your hips. You may place padding under your knees for comfort. 11. Let your head hang down, and point your tailbone toward the floor so your lower back becomes rounded  like the back of a cat. 12. Hold this position for 5 seconds. 13. Slowly lift your head and point your tailbone up toward the ceiling so your back forms a sagging arch like the back of a cow. 14. Hold this position for 5 seconds. Press-Ups Repeat these steps 5-10 times: 6. Lie on your abdomen (face-down) on the floor. 7. Place your palms near your head, about shoulder-width apart. 8. While you keep your back as relaxed as possible and keep your hips on the floor, slowly straighten your arms to raise the top half of your body and lift your shoulders. Do not use your back muscles to raise your upper torso. You may adjust the placement of your hands to make yourself more comfortable. 9. Hold this position for 5 seconds while you keep your back relaxed. 10. Slowly return to lying flat on the  floor. Bridges Repeat these steps 10 times: 1. Lie on your back on a firm surface. 2. Bend your knees so they are pointing toward the ceiling and your feet are flat on the floor. 3. Tighten your buttocks muscles and lift your buttocks off of the floor until your waist is at almost the same height as your knees. You should feel the muscles working in your buttocks and the back of your thighs. If you do not feel these muscles, slide your feet 1-2 inches farther away from your buttocks. 4. Hold this position for 3-5 seconds. 5. Slowly lower your hips to the starting position, and allow your buttocks muscles to relax completely. If this exercise is too easy, try doing it with your arms crossed over your chest. Abdominal Crunches Repeat these steps 5-10 times: 1. Lie on your back on a firm bed or the floor with your legs extended. 2. Bend your knees so they are pointing toward the ceiling and your feet are flat on the floor. 3. Cross your arms over your chest. 4. Tip your chin slightly toward your chest without bending your neck. 5. Tighten your abdominal muscles and slowly raise your trunk (torso) high enough to lift your shoulder blades a tiny bit off of the floor. Avoid raising your torso higher than that, because it can put too much stress on your low back and it does not help to strengthen your abdominal muscles. 6. Slowly return to your starting position. Back Lifts Repeat these steps 5-10 times: 1. Lie on your abdomen (face-down) with your arms at your sides, and rest your forehead on the floor. 2. Tighten the muscles in your legs and your buttocks. 3. Slowly lift your chest off of the floor while you keep your hips pressed to the floor. Keep the back of your head in line with the curve in your back. Your eyes should be looking at the floor. 4. Hold this position for 3-5 seconds. 5. Slowly return to your starting position. SEEK MEDICAL CARE IF:  Your back pain or discomfort gets much  worse when you do an exercise.  Your back pain or discomfort does not lessen within 2 hours after you exercise. If you have any of these problems, stop doing these exercises right away. Do not do them again unless your health care provider says that you can. SEEK IMMEDIATE MEDICAL CARE IF:  You develop sudden, severe back pain. If this happens, stop doing the exercises right away. Do not do them again unless your health care provider says that you can.   This information is not intended to replace advice given  to you by your health care provider. Make sure you discuss any questions you have with your health care provider.   Document Released: 06/28/2004 Document Revised: 02/09/2015 Document Reviewed: 07/15/2014 Elsevier Interactive Patient Education 2016 [employer] Inc[employer]   Take pain medicine as directed. Begin exercises as tolerated. Follow-up with the orthopedist or your primary physician. Return to emergency for any worsening symptoms.

## 2015-09-10 ENCOUNTER — Encounter: Payer: Self-pay | Admitting: Emergency Medicine

## 2015-09-10 ENCOUNTER — Emergency Department
Admission: EM | Admit: 2015-09-10 | Discharge: 2015-09-10 | Disposition: A | Discharge status: Home / Self Care | Payer: BLUE CROSS/BLUE SHIELD | Attending: Emergency Medicine | Admitting: Emergency Medicine

## 2015-09-10 DIAGNOSIS — Z7984 Long term (current) use of oral hypoglycemic drugs: Secondary | ICD-10-CM | POA: Insufficient documentation

## 2015-09-10 DIAGNOSIS — F418 Other specified anxiety disorders: Secondary | ICD-10-CM | POA: Insufficient documentation

## 2015-09-10 DIAGNOSIS — M545 Low back pain: Secondary | ICD-10-CM | POA: Insufficient documentation

## 2015-09-10 DIAGNOSIS — R739 Hyperglycemia, unspecified: Secondary | ICD-10-CM

## 2015-09-10 DIAGNOSIS — I10 Essential (primary) hypertension: Secondary | ICD-10-CM | POA: Insufficient documentation

## 2015-09-10 DIAGNOSIS — J45909 Unspecified asthma, uncomplicated: Secondary | ICD-10-CM | POA: Insufficient documentation

## 2015-09-10 DIAGNOSIS — E1165 Type 2 diabetes mellitus with hyperglycemia: Secondary | ICD-10-CM | POA: Insufficient documentation

## 2015-09-10 DIAGNOSIS — G8929 Other chronic pain: Secondary | ICD-10-CM | POA: Insufficient documentation

## 2015-09-10 DIAGNOSIS — N390 Urinary tract infection, site not specified: Secondary | ICD-10-CM | POA: Insufficient documentation

## 2015-09-10 HISTORY — DX: Essential (primary) hypertension: I10

## 2015-09-10 LAB — URINALYSIS COMPLETE WITH MICROSCOPIC (ARMC ONLY)
BACTERIA UA: NONE SEEN
BILIRUBIN URINE: NEGATIVE
Bilirubin Urine: NEGATIVE
GLUCOSE, UA: NEGATIVE mg/dL
Glucose, UA: >500 mg/dL — AB
Hgb urine dipstick: NEGATIVE
Ketones, ur: NEGATIVE mg/dL
Leukocytes, UA: NEGATIVE
NITRITE: NEGATIVE
Nitrite: NEGATIVE
PROTEIN: NEGATIVE mg/dL
Protein, ur: NEGATIVE mg/dL
SPECIFIC GRAVITY, URINE: 1.024 (ref 1.005–1.030)
SPECIFIC GRAVITY, URINE: 1.013 (ref 1.005–1.030)
pH: 5 (ref 5.0–8.0)
pH: 6 (ref 5.0–8.0)

## 2015-09-10 LAB — CBC
HEMATOCRIT: 35.2 % (ref 35.0–47.0)
HEMOGLOBIN: 12 g/dL (ref 12.0–16.0)
MCH: 28.7 pg (ref 26.0–34.0)
MCHC: 34.2 g/dL (ref 32.0–36.0)
MCV: 84.1 fL (ref 80.0–100.0)
Platelets: 342 10*3/uL (ref 150–440)
RBC: 4.19 MIL/uL (ref 3.80–5.20)
RDW: 13.6 % (ref 11.5–14.5)
WBC: 7.6 10*3/uL (ref 3.6–11.0)

## 2015-09-10 LAB — GLUCOSE, CAPILLARY: GLUCOSE-CAPILLARY: 328 mg/dL — AB (ref 65–99)

## 2015-09-10 LAB — BASIC METABOLIC PANEL
ANION GAP: 6 (ref 5–15)
BUN: 14 mg/dL (ref 6–20)
CO2: 24 mmol/L (ref 22–32)
Calcium: 8.4 mg/dL — ABNORMAL LOW (ref 8.9–10.3)
Chloride: 101 mmol/L (ref 101–111)
Creatinine, Ser: 0.71 mg/dL (ref 0.44–1.00)
GFR calc Af Amer: >60 mL/min (ref >60)
GLUCOSE: 316 mg/dL — AB (ref 65–99)
POTASSIUM: 4.2 mmol/L (ref 3.5–5.1)
SODIUM: 131 mmol/L — AB (ref 135–145)

## 2015-09-10 LAB — POCT PREGNANCY, URINE: Preg Test, Ur: NEGATIVE

## 2015-09-10 MED ORDER — CEPHALEXIN 500 MG PO CAPS
500.0000 mg | ORAL_CAPSULE | Freq: Once | ORAL | Status: AC
  Administered 2015-09-10: 500 mg via ORAL
  Filled 2015-09-10: qty 1

## 2015-09-10 MED ORDER — GLIPIZIDE 10 MG PO TABS: 10.0000 mg | ORAL_TABLET | Freq: Two times a day (BID) | ORAL | Status: DC

## 2015-09-10 MED ORDER — CEPHALEXIN 500 MG PO CAPS: 500.0000 mg | ORAL_CAPSULE | Freq: Three times a day (TID) | ORAL | Status: DC

## 2015-09-10 MED ORDER — CARISOPRODOL 350 MG PO TABS
350.0000 mg | ORAL_TABLET | Freq: Once | ORAL | Status: AC
  Administered 2015-09-10: 350 mg via ORAL

## 2015-09-10 MED ORDER — METFORMIN HCL 1000 MG PO TABS: 1000.0000 mg | ORAL_TABLET | Freq: Two times a day (BID) | ORAL | Status: DC

## 2015-09-10 MED ORDER — CARISOPRODOL 350 MG PO TABS: 350.0000 mg | ORAL_TABLET | Freq: Three times a day (TID) | ORAL | Status: DC | PRN

## 2015-09-10 NOTE — ED Notes (Signed)
Treatment care and plan discussed with patient, while discussing discharge planning patient states, " Wow, how did ya'll know I had a UTI? I didn't give a urine specimen." Dr Pershing ProudSchaevitz notified and lab called.
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
## 2015-09-10 NOTE — ED Notes (Signed)
Pt reports rhinorrhea and cough. Pt states has been feeling warm.

## 2015-09-10 NOTE — ED Notes (Addendum)
Pt to ed with c/o lower back pain that radiates upwards in back.  Pt states the pain has been going on for 2 months.  Reports she has been seen here multiple times for the same without relief.  Pt states " I want an x ray today to see if it is bruised or not and I might need to be admitted.  I have no pain medication to help with the pain"  Also reports she is out of BP meds and diabetes medication.  Pt CBG at triage was 327.  Pt states she ate cherry pancakes this am.  Pt also reports she drank wine last night.  Reports she has been using 800mg  tabs of IBU without relief.  States last time she was here she was given hydrocodone and that helped a "little bit"

## 2015-09-21 ENCOUNTER — Encounter: Payer: Self-pay | Admitting: Emergency Medicine

## 2015-09-21 ENCOUNTER — Emergency Department: Payer: BLUE CROSS/BLUE SHIELD

## 2015-09-21 ENCOUNTER — Emergency Department
Admission: EM | Admit: 2015-09-21 | Discharge: 2015-09-21 | Disposition: A | Discharge status: Home / Self Care | Payer: BLUE CROSS/BLUE SHIELD | Attending: Emergency Medicine | Admitting: Emergency Medicine

## 2015-09-21 DIAGNOSIS — T148XXA Other injury of unspecified body region, initial encounter: Secondary | ICD-10-CM

## 2015-09-21 DIAGNOSIS — E119 Type 2 diabetes mellitus without complications: Secondary | ICD-10-CM | POA: Insufficient documentation

## 2015-09-21 DIAGNOSIS — J302 Other seasonal allergic rhinitis: Secondary | ICD-10-CM | POA: Insufficient documentation

## 2015-09-21 DIAGNOSIS — Y999 Unspecified external cause status: Secondary | ICD-10-CM | POA: Insufficient documentation

## 2015-09-21 DIAGNOSIS — Z791 Long term (current) use of non-steroidal anti-inflammatories (NSAID): Secondary | ICD-10-CM | POA: Insufficient documentation

## 2015-09-21 DIAGNOSIS — Z7984 Long term (current) use of oral hypoglycemic drugs: Secondary | ICD-10-CM | POA: Insufficient documentation

## 2015-09-21 DIAGNOSIS — Y929 Unspecified place or not applicable: Secondary | ICD-10-CM | POA: Insufficient documentation

## 2015-09-21 DIAGNOSIS — S39012A Strain of muscle, fascia and tendon of lower back, initial encounter: Secondary | ICD-10-CM | POA: Insufficient documentation

## 2015-09-21 DIAGNOSIS — J45909 Unspecified asthma, uncomplicated: Secondary | ICD-10-CM | POA: Insufficient documentation

## 2015-09-21 DIAGNOSIS — Y9389 Activity, other specified: Secondary | ICD-10-CM | POA: Insufficient documentation

## 2015-09-21 DIAGNOSIS — W1839XA Other fall on same level, initial encounter: Secondary | ICD-10-CM | POA: Insufficient documentation

## 2015-09-21 DIAGNOSIS — S29012A Strain of muscle and tendon of back wall of thorax, initial encounter: Secondary | ICD-10-CM | POA: Insufficient documentation

## 2015-09-21 DIAGNOSIS — F329 Major depressive disorder, single episode, unspecified: Secondary | ICD-10-CM | POA: Insufficient documentation

## 2015-09-21 DIAGNOSIS — I10 Essential (primary) hypertension: Secondary | ICD-10-CM | POA: Insufficient documentation

## 2015-09-21 MED ORDER — MELOXICAM 15 MG PO TABS: 15.0000 mg | ORAL_TABLET | Freq: Every day | ORAL | Status: DC

## 2015-09-21 MED ORDER — TRAMADOL HCL 50 MG PO TABS
50.0000 mg | ORAL_TABLET | Freq: Once | ORAL | Status: AC
Start: 2015-09-21 — End: 2015-09-21
  Administered 2015-09-21: 50 mg via ORAL
  Filled 2015-09-21: qty 1

## 2015-09-21 MED ORDER — CETIRIZINE HCL 10 MG PO CAPS: 10.0000 mg | ORAL_CAPSULE | Freq: Every day | ORAL | Status: DC

## 2015-09-21 MED ORDER — TRAMADOL HCL 50 MG PO TABS: 50.0000 mg | ORAL_TABLET | Freq: Four times a day (QID) | ORAL | Status: DC | PRN

## 2015-09-21 NOTE — ED Notes (Signed)
Fell backwards over lawnmower 4 days ago, pain with inspiration since.

## 2015-09-21 NOTE — ED Provider Notes (Signed)
CSN: [id number]     Arrival date & time 09/21/15  1155 History   First MD Initiated Contact with Patient 09/21/15 1211     Chief Complaint  Patient presents with  . Pleurisy     HPI  40 year old female who presents to the emergency department for evaluation of back pain and right lateral chest wall tenderness. She states she was getting summer clothes out of a building and a mouse ran down her arm causing her to "freak out" and fall backwards over a lawnmower. She now has acute on chronic lower back pain. Incident occurred 4 days ago and has not improved. She is also complaining of nasal congestion.  Past Medical History  Diagnosis Date  . Diabetes mellitus without complication (HCC)   . Asthma   . Depressed   . Anxiety   . Hypertension    History reviewed. No pertinent past surgical history. No family history on file. Social History  Substance Use Topics  . Smoking status: Never Smoker   . Smokeless tobacco: None  . Alcohol Use: Yes   OB History    Gravida Para Term Preterm AB TAB SAB Ectopic Multiple Living   1         1     Review of Systems  Constitutional: Positive for activity change.  HENT: Positive for postnasal drip, rhinorrhea, sinus pressure and sneezing.   Eyes: Positive for itching.  Respiratory: Negative for shortness of breath.   Gastrointestinal: Negative for nausea and vomiting.  Musculoskeletal: Negative for neck stiffness.  Skin: Negative.   Neurological: Negative for syncope and numbness.  Psychiatric/Behavioral: Negative for confusion.      Allergies  Review of patient's allergies indicates no known allergies.  Home Medications   Prior to Admission medications   Medication Sig Start Date End Date Taking? Authorizing Provider  albuterol (PROVENTIL HFA;VENTOLIN HFA) 108 (90 BASE) MCG/ACT inhaler Inhale 2 puffs into the lungs every 6 (six) hours as needed for wheezing or shortness of breath. 04/25/15   Tommi Rumps, PA-C  benzonatate  (TESSALON PERLES) 100 MG capsule Take 1 capsule (100 mg total) by mouth 3 (three) times daily as needed for cough. 05/05/15 05/04/16  Darci Current, MD  Cetirizine HCl 10 MG CAPS Take 1 capsule (10 mg total) by mouth daily. 09/21/15   Chinita Pester, FNP  glipiZIDE (GLUCOTROL) 10 MG tablet Take 1 tablet (10 mg total) by mouth 2 (two) times daily before a meal. 09/10/15 09/09/16  Myrna Blazer, MD  glipiZIDE (GLUCOTROL) 10 MG tablet Take 1 tablet (10 mg total) by mouth 2 (two) times daily before a meal. 09/10/15 09/09/16  Myrna Blazer, MD  lisinopril (PRINIVIL,ZESTRIL) 10 MG tablet Take 1 tablet (10 mg total) by mouth daily. 05/05/15   Darci Current, MD  meloxicam (MOBIC) 15 MG tablet Take 1 tablet (15 mg total) by mouth daily. 09/21/15   Chinita Pester, FNP  metFORMIN (GLUCOPHAGE) 1000 MG tablet Take 1 tablet (1,000 mg total) by mouth 2 (two) times daily with a meal. 09/10/15 09/09/16  Myrna Blazer, MD  metFORMIN (GLUCOPHAGE) 1000 MG tablet Take 1 tablet (1,000 mg total) by mouth 2 (two) times daily with a meal. 09/10/15 09/09/16  Myrna Blazer, MD  traMADol (ULTRAM) 50 MG tablet Take 1 tablet (50 mg total) by mouth every 6 (six) hours as needed. 09/21/15   Rushawn Capshaw B Zaydan Papesh, FNP   BP 157/86 mmHg  Pulse 85  Temp(Src) 97.8 F (  36.6 C) (Oral)  Resp 18  Ht 5\' 1"  (1.549 m)  Wt 97.523 kg  BMI 40.64 kg/m2  SpO2 94%  LMP 09/11/2015 Physical Exam  Constitutional: She is oriented to person, place, and time. She appears well-developed and well-nourished.  HENT:  Head: Atraumatic.  Eyes: Conjunctivae and EOM are normal.  Neck: Normal range of motion. Neck supple.  Pulmonary/Chest: Effort normal and breath sounds normal. No respiratory distress. She has no wheezes. She exhibits tenderness.  Musculoskeletal: Normal range of motion.       Back:  Neurological: She is alert and oriented to person, place, and time.  Skin: Skin is warm and dry.  Psychiatric: She has a normal  mood and affect. Her behavior is normal. Judgment and thought content normal.  Nursing note and vitals reviewed.   ED Course  Procedures (including critical care time) Labs Review Labs Reviewed - No data to display  Imaging Review Dg Chest 2 View  09/21/2015  CLINICAL DATA:  Fall backwards 4 days ago with chest pain with inspiration, initial encounter EXAM: CHEST  2 VIEW COMPARISON:  04/25/2015 FINDINGS: Cardiac shadow is stable. The lungs are well aerated bilaterally. No focal infiltrate, effusion or pneumothorax is noted. The bony structures show old healed rib fractures on the right. No acute rib fracture is noted. IMPRESSION: No active cardiopulmonary disease. Electronically Signed   By: Alcide CleverMark  Lukens M.D.   On: 09/21/2015 12:23   I have personally reviewed and evaluated these images and lab results as part of my medical decision-making.   EKG Interpretation None      MDM   Final diagnoses:  Musculoskeletal strain  Seasonal allergies    Patient was encouraged to follow up with the primary care provider of her choice for symptoms that are not improving over the next few days. She will be given a prescription for meloxicam and tramadol today. She was instructed that she should call and get an appointment with pain management for her chronic back pain. Denies that the emergency department does not give refills of narcotic medications for chronic pain. She was advised that her x-rays today are negative for acute abnormality.    Chinita PesterCari B Gorgeous [place], FNP 09/21/15 1505  Myrna Blazeravid Matthew Schaevitz, MD 09/21/15 [phone]

## 2015-10-06 ENCOUNTER — Emergency Department
Admission: EM | Admit: 2015-10-06 | Discharge: 2015-10-06 | Disposition: A | Discharge status: Home / Self Care | Payer: BLUE CROSS/BLUE SHIELD | Attending: Emergency Medicine | Admitting: Emergency Medicine

## 2015-10-06 ENCOUNTER — Encounter: Payer: Self-pay | Admitting: Emergency Medicine

## 2015-10-06 DIAGNOSIS — F419 Anxiety disorder, unspecified: Secondary | ICD-10-CM | POA: Insufficient documentation

## 2015-10-06 DIAGNOSIS — Z7984 Long term (current) use of oral hypoglycemic drugs: Secondary | ICD-10-CM | POA: Insufficient documentation

## 2015-10-06 DIAGNOSIS — I10 Essential (primary) hypertension: Secondary | ICD-10-CM | POA: Insufficient documentation

## 2015-10-06 DIAGNOSIS — F32A Depression, unspecified: Secondary | ICD-10-CM

## 2015-10-06 DIAGNOSIS — F4323 Adjustment disorder with mixed anxiety and depressed mood: Secondary | ICD-10-CM | POA: Diagnosis not present

## 2015-10-06 DIAGNOSIS — Z79899 Other long term (current) drug therapy: Secondary | ICD-10-CM | POA: Insufficient documentation

## 2015-10-06 DIAGNOSIS — E119 Type 2 diabetes mellitus without complications: Secondary | ICD-10-CM | POA: Insufficient documentation

## 2015-10-06 DIAGNOSIS — F329 Major depressive disorder, single episode, unspecified: Secondary | ICD-10-CM | POA: Insufficient documentation

## 2015-10-06 DIAGNOSIS — J45909 Unspecified asthma, uncomplicated: Secondary | ICD-10-CM | POA: Insufficient documentation

## 2015-10-06 LAB — CBC
HEMATOCRIT: 38.7 % (ref 35.0–47.0)
Hemoglobin: 12.8 g/dL (ref 12.0–16.0)
MCH: 28.7 pg (ref 26.0–34.0)
MCHC: 33.1 g/dL (ref 32.0–36.0)
MCV: 86.6 fL (ref 80.0–100.0)
PLATELETS: 377 10*3/uL (ref 150–440)
RBC: 4.47 MIL/uL (ref 3.80–5.20)
RDW: 13.4 % (ref 11.5–14.5)
WBC: 8.2 10*3/uL (ref 3.6–11.0)

## 2015-10-06 LAB — COMPREHENSIVE METABOLIC PANEL
ALBUMIN: 4 g/dL (ref 3.5–5.0)
ALT: 23 U/L (ref 14–54)
AST: 26 U/L (ref 15–41)
Alkaline Phosphatase: 41 U/L (ref 38–126)
Anion gap: 10 (ref 5–15)
BILIRUBIN TOTAL: 0.5 mg/dL (ref 0.3–1.2)
BUN: 13 mg/dL (ref 6–20)
CHLORIDE: 100 mmol/L — AB (ref 101–111)
CO2: 24 mmol/L (ref 22–32)
Calcium: 9.3 mg/dL (ref 8.9–10.3)
Creatinine, Ser: 0.63 mg/dL (ref 0.44–1.00)
GFR calc Af Amer: >60 mL/min (ref >60)
GFR calc non Af Amer: >60 mL/min (ref >60)
GLUCOSE: 247 mg/dL — AB (ref 65–99)
POTASSIUM: 4.2 mmol/L (ref 3.5–5.1)
Sodium: 134 mmol/L — ABNORMAL LOW (ref 135–145)
Total Protein: 7.8 g/dL (ref 6.5–8.1)

## 2015-10-06 NOTE — ED Notes (Signed)
Supper provided  
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
## 2015-10-06 NOTE — ED Notes (Signed)
Psychiatrist is consulting at this time

## 2015-10-06 NOTE — BH Assessment (Signed)
Per request of Psych MD (Dr. Clapacs), writer provided the pt. with information and instructions on how to access Outpatient Mental Health & Substance Abuse Treatment ([facility]).  Patient denies SI/HI and AV/H.  

## 2015-10-06 NOTE — ED Notes (Signed)
BEHAVIORAL HEALTH ROUNDING Patient sleeping: No. Patient alert and oriented: yes Behavior appropriate: Yes.  ; If no, describe:  Nutrition and fluids offered: yes Toileting and hygiene offered: Yes  Sitter present: q15 minute observations and security  monitoring Law enforcement present: Yes  ODS  

## 2015-10-06 NOTE — ED Notes (Signed)

## 2015-10-06 NOTE — Discharge Instructions (Signed)
Please seek medical attention and help for any thoughts about wanting to harm herself, harm others, any concerning change in behavior, severe depression, inappropriate drug use or any other new or concerning symptoms. °Major Depressive Disorder °Major depressive disorder is a mental illness. It also may be called clinical depression or unipolar depression. Major depressive disorder usually causes feelings of sadness, hopelessness, or helplessness. Some people with this disorder do not feel particularly sad but lose interest in doing things they used to enjoy (anhedonia). Major depressive disorder also can cause physical symptoms. It can interfere with work, school, relationships, and other normal everyday activities. The disorder varies in severity but is longer lasting and more serious than the sadness we all feel from time to time in our lives. °Major depressive disorder often is triggered by stressful life events or major life changes. Examples of these triggers include divorce, loss of your job or home, a move, and the death of a family member or close friend. Sometimes this disorder occurs for no obvious reason at all. People who have family members with major depressive disorder or bipolar disorder are at higher risk for developing this disorder, with or without life stressors. Major depressive disorder can occur at any age. It may occur just once in your life (single episode major depressive disorder). It may occur multiple times (recurrent major depressive disorder). °SYMPTOMS °People with major depressive disorder have either anhedonia or depressed mood on nearly a daily basis for at least 2 weeks or longer. Symptoms of depressed mood include: °· Feelings of sadness (blue or down in the dumps) or emptiness. °· Feelings of hopelessness or helplessness. °· Tearfulness or episodes of crying (may be observed by others). °· Irritability (children and adolescents). °In addition to depressed mood or anhedonia or  both, people with this disorder have at least four of the following symptoms: °· Difficulty sleeping or sleeping too much.   °· Significant change (increase or decrease) in appetite or weight.   °· Lack of energy or motivation. °· Feelings of guilt and worthlessness.   °· Difficulty concentrating, remembering, or making decisions. °· Unusually slow movement (psychomotor retardation) or restlessness (as observed by others).   °· Recurrent wishes for death, recurrent thoughts of self-harm (suicide), or a suicide attempt. °People with major depressive disorder commonly have persistent negative thoughts about themselves, other people, and the world. People with severe major depressive disorder may experience distorted beliefs or perceptions about the world (psychotic delusions). They also may see or hear things that are not real (psychotic hallucinations). °DIAGNOSIS °Major depressive disorder is diagnosed through an assessment by your health care provider. Your health care provider will ask about aspects of your daily life, such as mood, sleep, and appetite, to see if you have the diagnostic symptoms of major depressive disorder. Your health care provider may ask about your medical history and use of alcohol or drugs, including prescription medicines. Your health care provider also may do a physical exam and blood work. This is because certain medical conditions and the use of certain substances can cause major depressive disorder-like symptoms (secondary depression). Your health care provider also may refer you to a mental health specialist for further evaluation and treatment. °TREATMENT °It is important to recognize the symptoms of major depressive disorder and seek treatment. The following treatments can be prescribed for this disorder:   °· Medicine. Antidepressant medicines usually are prescribed. Antidepressant medicines are thought to correct chemical imbalances in the brain that are commonly associated with  major depressive disorder. Other types of medicine may be   added if the symptoms do not respond to antidepressant medicines alone or if psychotic delusions or hallucinations occur. °· Talk therapy. Talk therapy can be helpful in treating major depressive disorder by providing support, education, and guidance. Certain types of talk therapy also can help with negative thinking (cognitive behavioral therapy) and with relationship issues that trigger this disorder (interpersonal therapy). °A mental health specialist can help determine which treatment is best for you. Most people with major depressive disorder do well with a combination of medicine and talk therapy. Treatments involving electrical stimulation of the brain can be used in situations with extremely severe symptoms or when medicine and talk therapy do not work over time. These treatments include electroconvulsive therapy, transcranial magnetic stimulation, and vagal nerve stimulation. °  °This information is not intended to replace advice given to you by your health care provider. Make sure you discuss any questions you have with your health care provider. °  °Document Released: 09/15/2012 Document Revised: 06/11/2014 Document Reviewed: 09/15/2012 °Elsevier Interactive Patient Education ©2016 [employer] ° °

## 2015-10-06 NOTE — ED Provider Notes (Signed)
-----------------------------------------   5:41 PM on 10/06/2015 -----------------------------------------  Psychiatry has evaluated the patient. They feel she is safe for discharge. Patient has been given [facility] follow-up information.  Phineas SemenGraydon Kaien Pezzullo, MD 10/06/15 [phone]

## 2015-10-06 NOTE — Consult Note (Signed)
[facility] Face-to-Face Psychiatry Consult   Reason for Consult:  Consult for this 40 year old woman who presented voluntarily to the emergency room saying she wanted to get evaluated for her nerves and mood. Referring Physician:  Corky Downs Patient Identification: Virginia Peterson MRN:  [id number] Principal Diagnosis: Adjustment disorder with mixed anxiety and depressed mood Diagnosis:   Patient Active Problem List   Diagnosis Date Noted  . Dysthymia [F34.1] 10/06/2015  . Adjustment disorder with mixed anxiety and depressed mood [F43.23] 10/06/2015    Total Time spent with patient: 1 hour  Subjective:   Virginia Peterson is a 40 y.o. female patient admitted with "I just want to get evaluated because I've been more depressed".  HPI:  Patient interviewed. Chart reviewed. Case discussed with TTS and emergency room physician. Labs and vitals reviewed. Patient presented to the emergency room saying that she wanted to get evaluated because she had been more depressed recently. Tells me that her mood has been down chronically but in the last few weeks she's been feeling more nervous and depressed. Her mother normally lives with her and recently went up to [place] leaving the patient alone in the house with her stepfather and also her own husband. She says she can't get along with the stepfather. He is irritating and hostile to her and she feels like sometime she wants to slap him. She sleeps poorly at night. Appetite is been fine. Patient denies any suicidal thoughts or intent or plan. Denies any intent or plan of hurting anyone. Feels sometimes like she might hit her stepfather. Denies any hallucinations. Not currently seeing a psychiatrist. Says that she used to take Xanax and Lexapro when she was living in [place] but hasn't been on it in months.  Medical history: Patient is overweight. Has diabetes. She is supposed to be taking metformin and glipizide but does not have a doctor locally yet.  Social history:  She is married. Lives with her husband and her mother and her stepfather. Patient is not currently working. She is looking forward to getting her disability approved. She dislikes her stepfather.  Substance abuse history: Denies that she drinks alcohol regularly. Says she has had alcohol very rarely. No drug use.  Past Psychiatric History: Patient denies any previous psychiatric hospitalization. Denies having ever seen a psychiatrist specifically for treatment. Was getting medication from her primary care doctor in [place]. No history of suicide attempts. No history of violence. No history of psychosis.  Risk to Self: Suicidal Ideation: No-Not Currently/Within Last 6 Months Suicidal Intent: No Is patient at risk for suicide?: No Suicidal Plan?: No-Not Currently/Within Last 6 Months Access to Means: No What has been your use of drugs/alcohol within the last 12 months?: Repoorts of none How many times?: 0 Other Self Harm Risks: Reports of none Triggers for Past Attempts: None known Intentional Self Injurious Behavior: None Risk to Others: Homicidal Ideation: No Thoughts of Harm to Others: No Current Homicidal Intent: No Current Homicidal Plan: No Access to Homicidal Means: No Identified Victim: Reports of none History of harm to others?: No Assessment of Violence: None Noted Violent Behavior Description: Reports of none Does patient have access to weapons?: No Criminal Charges Pending?: No Does patient have a court date: No Prior Inpatient Therapy: Prior Inpatient Therapy: No Prior Therapy Dates: Reports of none  Prior Therapy Facilty/Provider(s): Reports of none  Reason for Treatment: Reports of none  Prior Outpatient Therapy: Prior Outpatient Therapy: No Prior Therapy Dates: Reports of none  Prior Therapy Facilty/Provider(s):  Reports of none  Reason for Treatment: Reports of none  Does patient have an ACCT team?: No Does patient have Intensive In-House Services?  : No Does  patient have [facility] services? : No Does patient have P4CC services?: No  Past Medical History:  Past Medical History  Diagnosis Date  . Diabetes mellitus without complication ([place])   . Asthma   . Depressed   . Anxiety   . Hypertension     Past Surgical History  Procedure Laterality Date  . Neck surgery     Family History: No family history on file. Family Psychiatric  History: Denies any family history of mental health problems at all Social History:  History  Alcohol Use  . Yes     History  Drug Use No    Social History   Social History  . Marital Status: Married    Spouse Name: N/A  . Number of Children: N/A  . Years of Education: N/A   Social History Main Topics  . Smoking status: Never Smoker   . Smokeless tobacco: None  . Alcohol Use: Yes  . Drug Use: No  . Sexual Activity: Not Asked   Other Topics Concern  . None   Social History Narrative   Additional Social History:    Allergies:  No Known Allergies  Labs:  Results for orders placed or performed during the hospital encounter of 10/06/15 (from the past 48 hour(s))  CBC     Status: None   Collection Time: 10/06/15 10:42 AM  Result Value Ref Range   WBC 8.2 3.6 - 11.0 K/uL   RBC 4.47 3.80 - 5.20 MIL/uL   Hemoglobin 12.8 12.0 - 16.0 g/dL   HCT 38.7 35.0 - 47.0 %   MCV 86.6 80.0 - 100.0 fL   MCH 28.7 26.0 - 34.0 pg   MCHC 33.1 32.0 - 36.0 g/dL   RDW 13.4 11.5 - 14.5 %   Platelets 377 150 - 440 K/uL  Comprehensive metabolic panel     Status: Abnormal   Collection Time: 10/06/15 10:42 AM  Result Value Ref Range   Sodium 134 (L) 135 - 145 mmol/L   Potassium 4.2 3.5 - 5.1 mmol/L   Chloride 100 (L) 101 - 111 mmol/L   CO2 24 22 - 32 mmol/L   Glucose, Bld 247 (H) 65 - 99 mg/dL   BUN 13 6 - 20 mg/dL   Creatinine, Ser 0.63 0.44 - 1.00 mg/dL   Calcium 9.3 8.9 - 10.3 mg/dL   Total Protein 7.8 6.5 - 8.1 g/dL   Albumin 4.0 3.5 - 5.0 g/dL   AST 26 15 - 41 U/L   ALT 23 14 - 54 U/L   Alkaline  Phosphatase 41 38 - 126 U/L   Total Bilirubin 0.5 0.3 - 1.2 mg/dL   GFR calc non Af Amer >60 >60 mL/min   GFR calc Af Amer >60 >60 mL/min    Comment: (NOTE) The eGFR has been calculated using the CKD EPI equation. This calculation has not been validated in all clinical situations. eGFR's persistently <60 mL/min signify possible Chronic Kidney Disease.    Anion gap 10 5 - 15    No current facility-administered medications for this encounter.   Current Outpatient Prescriptions  Medication Sig Dispense Refill  . albuterol (PROVENTIL HFA;VENTOLIN HFA) 108 (90 BASE) MCG/ACT inhaler Inhale 2 puffs into the lungs every 6 (six) hours as needed for wheezing or shortness of breath. 1 Inhaler 2  . benzonatate (TESSALON PERLES)  100 MG capsule Take 1 capsule (100 mg total) by mouth 3 (three) times daily as needed for cough. 30 capsule 0  . Cetirizine HCl 10 MG CAPS Take 1 capsule (10 mg total) by mouth daily. 30 capsule 0  . glipiZIDE (GLUCOTROL) 10 MG tablet Take 1 tablet (10 mg total) by mouth 2 (two) times daily before a meal. 60 tablet 0  . glipiZIDE (GLUCOTROL) 10 MG tablet Take 1 tablet (10 mg total) by mouth 2 (two) times daily before a meal. 60 tablet 0  . lisinopril (PRINIVIL,ZESTRIL) 10 MG tablet Take 1 tablet (10 mg total) by mouth daily. 90 tablet 0  . meloxicam (MOBIC) 15 MG tablet Take 1 tablet (15 mg total) by mouth daily. 30 tablet 0  . metFORMIN (GLUCOPHAGE) 1000 MG tablet Take 1 tablet (1,000 mg total) by mouth 2 (two) times daily with a meal. 60 tablet 0  . metFORMIN (GLUCOPHAGE) 1000 MG tablet Take 1 tablet (1,000 mg total) by mouth 2 (two) times daily with a meal. 60 tablet 0  . traMADol (ULTRAM) 50 MG tablet Take 1 tablet (50 mg total) by mouth every 6 (six) hours as needed. 9 tablet 0    Musculoskeletal: Strength & Muscle Tone: within normal limits Gait & Station: normal Patient leans: N/A  Psychiatric Specialty Exam: Review of Systems  Constitutional: Negative.   HENT:  Negative.   Eyes: Negative.   Respiratory: Negative.   Cardiovascular: Negative.   Gastrointestinal: Negative.   Musculoskeletal: Negative.   Skin: Negative.   Neurological: Negative.   Psychiatric/Behavioral: Positive for depression. Negative for suicidal ideas, hallucinations, memory loss and substance abuse. The patient is nervous/anxious and has insomnia.     Blood pressure 146/79, pulse 88, temperature 97.6 F (36.4 C), temperature source Oral, resp. rate 18, height '5\' 1"'  (1.549 m), weight 99.791 kg (220 lb), last menstrual period 09/11/2015, SpO2 99 %.Body mass index is 41.59 kg/(m^2).  General Appearance: Casual  Eye Contact::  Fair  Speech:  Normal Rate  Volume:  Normal  Mood:  Euthymic  Affect:  Congruent  Thought Process:  Goal Directed  Orientation:  Full (Time, Place, and Person)  Thought Content:  Negative  Suicidal Thoughts:  No  Homicidal Thoughts:  No  Memory:  Immediate;   Good Recent;   Fair Remote;   Good  Judgement:  Fair  Insight:  Fair  Psychomotor Activity:  Normal  Concentration:  Fair  Recall:  [employer] of Knowledge:Fair  Language: Fair  Akathisia:  No  Handed:  Right  AIMS (if indicated):     Assets:  Communication Skills Desire for Improvement Housing Resilience  ADL's:  Intact  Cognition: WNL  Sleep:      Treatment Plan Summary: Plan 40 year old woman presented voluntarily to the emergency room stating that she wanted to be evaluated. She describes chronic anxiety and depression. Low energy. Not having any psychotic symptoms. Not having any suicidal or homicidal ideation. She says that she is afraid that if her stepfather mouth to her she might lose it and strike him. She clearly knows that this would be an appropriate behavior however. She is not threatening to harm anyone. Patient gives the impression that she is seeking sedatives particularly Xanax. She at this point does not require inpatient psychiatric hospitalization. Does not meet  commitment criteria. Patient was given supportive counseling and encouraged to follow-up with [facility]. We will give her information about outpatient psychiatric resources in the community. Also encourage her to get a primary  care doctor as her blood sugars are clearly running high. Patient can be released from the emergency room at the discretion of the ER physician.  Disposition: Patient does not meet criteria for psychiatric inpatient admission. Supportive therapy provided about ongoing stressors.  Alethia Berthold, MD 10/06/2015 4:53 PM

## 2015-10-06 NOTE — ED Provider Notes (Signed)
[facility] Emergency Department Provider Note  ____________________________________________    I have reviewed the triage vital signs and the nursing notes.   HISTORY  Chief Complaint Homicidal    HPI Tahirih Lair is a 40 y.o. female who presents with complaints offeeling overwhelmed. She denies intent to hurt herself but reports her stepfather is "driving her crazy". She feels on edge and is worried she might hit him.     Past Medical History  Diagnosis Date  . Diabetes mellitus without complication ([facility])   . Asthma   . Depressed   . Anxiety   . Hypertension     There are no active problems to display for this patient.   Past Surgical History  Procedure Laterality Date  . Neck surgery      Current Outpatient Rx  Name  Route  Sig  Dispense  Refill  . albuterol (PROVENTIL HFA;VENTOLIN HFA) 108 (90 BASE) MCG/ACT inhaler   Inhalation   Inhale 2 puffs into the lungs every 6 (six) hours as needed for wheezing or shortness of breath.   1 Inhaler   2   . benzonatate (TESSALON PERLES) 100 MG capsule   Oral   Take 1 capsule (100 mg total) by mouth 3 (three) times daily as needed for cough.   30 capsule   0   . Cetirizine HCl 10 MG CAPS   Oral   Take 1 capsule (10 mg total) by mouth daily.   30 capsule   0   . glipiZIDE (GLUCOTROL) 10 MG tablet   Oral   Take 1 tablet (10 mg total) by mouth 2 (two) times daily before a meal.   60 tablet   0   . glipiZIDE (GLUCOTROL) 10 MG tablet   Oral   Take 1 tablet (10 mg total) by mouth 2 (two) times daily before a meal.   60 tablet   0   . lisinopril (PRINIVIL,ZESTRIL) 10 MG tablet   Oral   Take 1 tablet (10 mg total) by mouth daily.   90 tablet   0   . meloxicam (MOBIC) 15 MG tablet   Oral   Take 1 tablet (15 mg total) by mouth daily.   30 tablet   0   . metFORMIN (GLUCOPHAGE) 1000 MG tablet   Oral   Take 1 tablet (1,000 mg total) by mouth 2 (two) times daily with a meal.   60  tablet   0   . metFORMIN (GLUCOPHAGE) 1000 MG tablet   Oral   Take 1 tablet (1,000 mg total) by mouth 2 (two) times daily with a meal.   60 tablet   0   . traMADol (ULTRAM) 50 MG tablet   Oral   Take 1 tablet (50 mg total) by mouth every 6 (six) hours as needed.   9 tablet   0     Allergies Review of patient's allergies indicates no known allergies.  No family history on file.  Social History Social History  Substance Use Topics  . Smoking status: Never Smoker   . Smokeless tobacco: None  . Alcohol Use: Yes    Review of Systems  Constitutional: Negative for fever. Eyes: Negative for redness ENT: Negative for sore throat Cardiovascular: Negative for chest pain Respiratory: Negative for shortness of breath. Gastrointestinal: Negative for abdominal pain Genitourinary: Negative for dysuria. Musculoskeletal: Negative for back pain. Skin: Negative for rash. Neurological: Negative for focal weakness Psychiatric: Positive for anxiety    ____________________________________________  PHYSICAL EXAM:  VITAL SIGNS: ED Triage Vitals  Enc Vitals Group     BP 10/06/15 1032 148/81 mmHg     Pulse Rate 10/06/15 1032 93     Resp 10/06/15 1032 20     Temp 10/06/15 1032 97.8 F (36.6 C)     Temp Source 10/06/15 1032 Oral     SpO2 10/06/15 1032 99 %     Weight 10/06/15 1032 220 lb (99.791 kg)     Height 10/06/15 1032 5\' 1"  (1.549 m)     Head Cir --      Peak Flow --      Pain Score 10/06/15 1032 10     Pain Loc --      Pain Edu? --      Excl. in GC? --      Constitutional: Alert and oriented. Well appearing and in no distress.  Eyes: Conjunctivae are normal. No erythema or injection ENT   Head: Normocephalic and atraumatic.   Mouth/Throat: Mucous membranes are moist. Cardiovascular: Normal rate, regular rhythm. Normal and symmetric distal pulses are present in the upper extremities.  Respiratory: Normal respiratory effort without tachypnea nor  retractions. Breath sounds are clear and equal bilaterally.  Gastrointestinal: Soft and non-tender in all quadrants. No distention. There is no CVA tenderness. Genitourinary: deferred Musculoskeletal: Nontender with normal range of motion in all extremities. No lower extremity tenderness nor edema. Neurologic:  Normal speech and language. No gross focal neurologic deficits are appreciated. Skin:  Skin is warm, dry and intact. No rash noted. Psychiatric: Mood and affect are normal. Patient is calm and appropriate at this time  ____________________________________________    LABS (pertinent positives/negatives)  Labs Reviewed - No data to display  ____________________________________________   EKG  None  ____________________________________________    RADIOLOGY  None  ____________________________________________   PROCEDURES  Procedure(s) performed: none  Critical Care performed: none  ____________________________________________   INITIAL IMPRESSION / ASSESSMENT AND PLAN / ED COURSE  Pertinent labs & imaging results that were available during my care of the patient were reviewed by me and considered in my medical decision making (see chart for details).  Patient presents with complaints of "feeling on edge ". She denies SI. She has no medical complaints. I have asked psychiatry to see her and TTS.  ____________________________________________   FINAL CLINICAL IMPRESSION(S) / ED DIAGNOSES  Anxiety       Jene Everyobert Jermal Dismuke, MD 10/06/15 1451

## 2015-10-21 ENCOUNTER — Encounter: Payer: Self-pay | Admitting: Emergency Medicine

## 2015-10-21 ENCOUNTER — Emergency Department
Admission: EM | Admit: 2015-10-21 | Discharge: 2015-10-22 | Disposition: A | Discharge status: Home / Self Care | Payer: Self-pay | Attending: Emergency Medicine | Admitting: Emergency Medicine

## 2015-10-21 ENCOUNTER — Emergency Department: Payer: Self-pay

## 2015-10-21 DIAGNOSIS — E119 Type 2 diabetes mellitus without complications: Secondary | ICD-10-CM | POA: Insufficient documentation

## 2015-10-21 DIAGNOSIS — Z7984 Long term (current) use of oral hypoglycemic drugs: Secondary | ICD-10-CM | POA: Insufficient documentation

## 2015-10-21 DIAGNOSIS — I10 Essential (primary) hypertension: Secondary | ICD-10-CM | POA: Insufficient documentation

## 2015-10-21 DIAGNOSIS — J45909 Unspecified asthma, uncomplicated: Secondary | ICD-10-CM | POA: Insufficient documentation

## 2015-10-21 DIAGNOSIS — R1084 Generalized abdominal pain: Secondary | ICD-10-CM

## 2015-10-21 DIAGNOSIS — R197 Diarrhea, unspecified: Secondary | ICD-10-CM | POA: Insufficient documentation

## 2015-10-21 DIAGNOSIS — Z791 Long term (current) use of non-steroidal anti-inflammatories (NSAID): Secondary | ICD-10-CM | POA: Insufficient documentation

## 2015-10-21 DIAGNOSIS — Z79899 Other long term (current) drug therapy: Secondary | ICD-10-CM | POA: Insufficient documentation

## 2015-10-21 DIAGNOSIS — R109 Unspecified abdominal pain: Secondary | ICD-10-CM

## 2015-10-21 DIAGNOSIS — R112 Nausea with vomiting, unspecified: Secondary | ICD-10-CM

## 2015-10-21 DIAGNOSIS — F329 Major depressive disorder, single episode, unspecified: Secondary | ICD-10-CM | POA: Insufficient documentation

## 2015-10-21 LAB — URINALYSIS COMPLETE WITH MICROSCOPIC (ARMC ONLY)
Bacteria, UA: NONE SEEN
Bilirubin Urine: NEGATIVE
Glucose, UA: >500 mg/dL — AB
Leukocytes, UA: NEGATIVE
NITRITE: NEGATIVE
PROTEIN: 100 mg/dL — AB
SPECIFIC GRAVITY, URINE: 1.03 (ref 1.005–1.030)
pH: 5 (ref 5.0–8.0)

## 2015-10-21 LAB — CBC
HCT: 40 % (ref 35.0–47.0)
HEMOGLOBIN: 13.7 g/dL (ref 12.0–16.0)
MCH: 28.9 pg (ref 26.0–34.0)
MCHC: 34.3 g/dL (ref 32.0–36.0)
MCV: 84.2 fL (ref 80.0–100.0)
Platelets: 409 10*3/uL (ref 150–440)
RBC: 4.75 MIL/uL (ref 3.80–5.20)
RDW: 12.5 % (ref 11.5–14.5)
WBC: 14.4 10*3/uL — ABNORMAL HIGH (ref 3.6–11.0)

## 2015-10-21 LAB — LIPASE, BLOOD: Lipase: 29 U/L (ref 11–51)

## 2015-10-21 LAB — COMPREHENSIVE METABOLIC PANEL
ALK PHOS: 54 U/L (ref 38–126)
ALT: 22 U/L (ref 14–54)
ANION GAP: 8 (ref 5–15)
AST: 19 U/L (ref 15–41)
Albumin: 3.8 g/dL (ref 3.5–5.0)
BILIRUBIN TOTAL: 0.2 mg/dL — AB (ref 0.3–1.2)
BUN: 11 mg/dL (ref 6–20)
CALCIUM: 9.3 mg/dL (ref 8.9–10.3)
CO2: 27 mmol/L (ref 22–32)
Chloride: 99 mmol/L — ABNORMAL LOW (ref 101–111)
Creatinine, Ser: 0.64 mg/dL (ref 0.44–1.00)
GFR calc non Af Amer: >60 mL/min (ref >60)
Glucose, Bld: 259 mg/dL — ABNORMAL HIGH (ref 65–99)
Potassium: 4.2 mmol/L (ref 3.5–5.1)
SODIUM: 134 mmol/L — AB (ref 135–145)
TOTAL PROTEIN: 7.3 g/dL (ref 6.5–8.1)

## 2015-10-21 LAB — POCT PREGNANCY, URINE: PREG TEST UR: NEGATIVE

## 2015-10-21 MED ORDER — ONDANSETRON HCL 4 MG PO TABS: 4.0000 mg | ORAL_TABLET | Freq: Every day | ORAL | Status: DC | PRN

## 2015-10-21 MED ORDER — DICYCLOMINE HCL 20 MG PO TABS: 20.0000 mg | ORAL_TABLET | Freq: Three times a day (TID) | ORAL | Status: DC | PRN

## 2015-10-21 MED ORDER — SODIUM CHLORIDE 0.9 % IV BOLUS (SEPSIS)
1000.0000 mL | Freq: Once | INTRAVENOUS | Status: AC
  Administered 2015-10-21: 1000 mL via INTRAVENOUS

## 2015-10-21 MED ORDER — MORPHINE SULFATE (PF) 4 MG/ML IV SOLN
4.0000 mg | Freq: Once | INTRAVENOUS | Status: AC
Start: 2015-10-21 — End: 2015-10-21
  Administered 2015-10-21: 4 mg via INTRAVENOUS
  Filled 2015-10-21: qty 1

## 2015-10-21 MED ORDER — DIATRIZOATE MEGLUMINE & SODIUM 66-10 % PO SOLN
15.0000 mL | Freq: Once | ORAL | Status: AC
  Administered 2015-10-21: 15 mL via ORAL

## 2015-10-21 MED ORDER — IOPAMIDOL (ISOVUE-300) INJECTION 61%
100.0000 mL | Freq: Once | INTRAVENOUS | Status: AC | PRN
  Administered 2015-10-21: 100 mL via INTRAVENOUS

## 2015-10-21 MED ORDER — ONDANSETRON HCL 4 MG/2ML IJ SOLN
4.0000 mg | Freq: Once | INTRAMUSCULAR | Status: AC
  Administered 2015-10-21: 4 mg via INTRAVENOUS
  Filled 2015-10-21: qty 2

## 2015-10-21 NOTE — Discharge Instructions (Signed)
Abdominal Pain, Adult °Many things can cause belly (abdominal) pain. Most times, the belly pain is not dangerous. Many cases of belly pain can be watched and treated at home. °HOME CARE  °· Do not take medicines that help you go poop (laxatives) unless told to by your doctor. °· Only take medicine as told by your doctor. °· Eat or drink as told by your doctor. Your doctor will tell you if you should be on a special diet. °GET HELP IF: °· You do not know what is causing your belly pain. °· You have belly pain while you are sick to your stomach (nauseous) or have runny poop (diarrhea). °· You have pain while you pee or poop. °· Your belly pain wakes you up at night. °· You have belly pain that gets worse or better when you eat. °· You have belly pain that gets worse when you eat fatty foods. °· You have a fever. °GET HELP RIGHT AWAY IF:  °· The pain does not go away within 2 hours. °· You keep throwing up (vomiting). °· The pain changes and is only in the right or left part of the belly. °· You have bloody or tarry looking poop. °MAKE SURE YOU:  °· Understand these instructions. °· Will watch your condition. °· Will get help right away if you are not doing well or get worse. °  °This information is not intended to replace advice given to you by your health care provider. Make sure you discuss any questions you have with your health care provider. °  °Document Released: 11/07/2007 Document Revised: 06/11/2014 Document Reviewed: 01/28/2013 °Elsevier Interactive Patient Education ©2016 [employer] ° °Diarrhea °Diarrhea is watery poop (stool). It can make you feel weak, tired, thirsty, or give you a dry mouth (signs of dehydration). Watery poop is a sign of another problem, most often an infection. It often lasts 2-3 days. It can last longer if it is a sign of something serious. Take care of yourself as told by your doctor. °HOME CARE  °· Drink 1 cup (8 ounces) of fluid each time you have watery poop. °· Do not drink  the following fluids: °¨ Those that contain simple sugars (fructose, glucose, galactose, lactose, sucrose, maltose). °¨ Sports drinks. °¨ Fruit juices. °¨ Whole milk products. °¨ Sodas. °¨ Drinks with caffeine (coffee, tea, soda) or alcohol. °· Oral rehydration solution may be used if the doctor says it is okay. You may make your own solution. Follow this recipe: °¨  - teaspoon table salt. °¨ ¾ teaspoon baking soda. °¨  teaspoon salt substitute containing potassium chloride. °¨ 1 tablespoons sugar. °¨ 1 liter (34 ounces) of water. °· Avoid the following foods: °¨ High fiber foods, such as raw fruits and vegetables. °¨ Nuts, seeds, and whole grain breads and cereals. °¨  Those that are sweetened with sugar alcohols (xylitol, sorbitol, mannitol). °· Try eating the following foods: °¨ Starchy foods, such as rice, toast, pasta, low-sugar cereal, oatmeal, baked potatoes, crackers, and bagels. °¨ Bananas. °¨ Applesauce. °· Eat probiotic-rich foods, such as yogurt and milk products that are fermented. °· Wash your hands well after each time you have watery poop. °· Only take medicine as told by your doctor. °· Take a warm bath to help lessen burning or pain from having watery poop. °GET HELP RIGHT AWAY IF:  °· You cannot drink fluids without throwing up (vomiting). °· You keep throwing up. °· You have blood in your poop, or your poop looks   black and tarry. °· You do not pee (urinate) in 6-8 hours, or there is only a small amount of very dark pee. °· You have belly (abdominal) pain that gets worse or stays in the same spot (localizes). °· You are weak, dizzy, confused, or light-headed. °· You have a very bad headache. °· Your watery poop gets worse or does not get better. °· You have a fever or lasting symptoms for more than 2-3 days. °· You have a fever and your symptoms suddenly get worse. °MAKE SURE YOU:  °· Understand these instructions. °· Will watch your condition. °· Will get help right away if you are not doing well  or get worse. °  °This information is not intended to replace advice given to you by your health care provider. Make sure you discuss any questions you have with your health care provider. °  °Document Released: 11/07/2007 Document Revised: 06/11/2014 Document Reviewed: 01/27/2012 °Elsevier Interactive Patient Education ©2016 Elsevier Inc. ° °Nausea and Vomiting °Nausea means you feel sick to your stomach. Throwing up (vomiting) is a reflex where stomach contents come out of your mouth. °HOME CARE  °· Take medicine as told by your doctor. °· Do not force yourself to eat. However, you do need to drink fluids. °· If you feel like eating, eat a normal diet as told by your doctor. °¨ Eat rice, wheat, potatoes, bread, lean meats, yogurt, fruits, and vegetables. °¨ Avoid high-fat foods. °· Drink enough fluids to keep your pee (urine) clear or pale yellow. °· Ask your doctor how to replace body fluid losses (rehydrate). Signs of body fluid loss (dehydration) include: °¨ Feeling very thirsty. °¨ Dry lips and mouth. °¨ Feeling dizzy. °¨ Dark pee. °¨ Peeing less than normal. °¨ Feeling confused. °¨ Fast breathing or heart rate. °GET HELP RIGHT AWAY IF:  °· You have blood in your throw up. °· You have black or bloody poop (stool). °· You have a bad headache or stiff neck. °· You feel confused. °· You have bad belly (abdominal) pain. °· You have chest pain or trouble breathing. °· You do not pee at least once every 8 hours. °· You have cold, clammy skin. °· You keep throwing up after 24 to 48 hours. °· You have a fever. °MAKE SURE YOU:  °· Understand these instructions. °· Will watch your condition. °· Will get help right away if you are not doing well or get worse. °  °This information is not intended to replace advice given to you by your health care provider. Make sure you discuss any questions you have with your health care provider. °  °Document Released: 11/07/2007 Document Revised: 08/13/2011 Document Reviewed:  10/20/2010 °Elsevier Interactive Patient Education ©2016 [employer]vier Inc. ° °

## 2015-10-21 NOTE — ED Notes (Signed)
Pt reports abdominal pain, and multiple occurrence of diarrhea and vomiting beginning yesterday. Pt reports her stool is complete liquid with strong/foul odor. Pt reports she is unable to tolerate any food/fluids.

## 2015-10-21 NOTE — ED Notes (Signed)
Patient states for last few days she has been unable "to keep anything in my system". Reports N/V/D with episodes of V/D every 30-45 mins. Patient reports emesis is clear in color. Notices abdominal pain to RLQ fairly constantly. Also reports pain to lower back after vomiting.

## 2015-10-21 NOTE — ED Provider Notes (Signed)
[facility] Emergency Department Provider Note   ____________________________________________  Time seen: Approximately 950PM  I have reviewed the triage vital signs and the nursing notes.   HISTORY  Chief Complaint Abdominal Pain; Emesis; and Diarrhea    HPI Virginia Peterson is a 40 y.o. female with a history of diabetes and hypertension who is presenting to the emergency department today with diffuse abdominal pain as well as nausea vomiting and diarrhea. Says the symptoms have been ongoing over the past 2 days. Says the pain feels like a cramping pain and is a 9 out of 10 at this time. She says she has vomited 4-5 times per day and had up to 10 episodes of diarrhea per day. She denies any recent antibiotic use. Denies any blood in the vomit or diarrhea. Denies any known sick contacts. Says that she also had aching pain to the left side of her back especially when she vomits. Denies any chest pain, shortness of breath, cough or runny nose. Denies any fever. Denies any vaginal bleeding or discharge. Denies any burning with urination or frequency. Hat is in the toilet in the room but the patient says she has not had a bowel movement since arriving to the emergency department.   Past Medical History  Diagnosis Date  . Diabetes mellitus without complication (HCC)   . Asthma   . Depressed   . Anxiety   . Hypertension     Patient Active Problem List   Diagnosis Date Noted  . Dysthymia 10/06/2015  . Adjustment disorder with mixed anxiety and depressed mood 10/06/2015    Past Surgical History  Procedure Laterality Date  . Neck surgery      Current Outpatient Rx  Name  Route  Sig  Dispense  Refill  . albuterol (PROVENTIL HFA;VENTOLIN HFA) 108 (90 BASE) MCG/ACT inhaler   Inhalation   Inhale 2 puffs into the lungs every 6 (six) hours as needed for wheezing or shortness of breath.   1 Inhaler   2   . benzonatate (TESSALON PERLES) 100 MG capsule   Oral  Take 1 capsule (100 mg total) by mouth 3 (three) times daily as needed for cough.   30 capsule   0   . Cetirizine HCl 10 MG CAPS   Oral   Take 1 capsule (10 mg total) by mouth daily.   30 capsule   0   . glipiZIDE (GLUCOTROL) 10 MG tablet   Oral   Take 1 tablet (10 mg total) by mouth 2 (two) times daily before a meal.   60 tablet   0   . glipiZIDE (GLUCOTROL) 10 MG tablet   Oral   Take 1 tablet (10 mg total) by mouth 2 (two) times daily before a meal.   60 tablet   0   . lisinopril (PRINIVIL,ZESTRIL) 10 MG tablet   Oral   Take 1 tablet (10 mg total) by mouth daily.   90 tablet   0   . meloxicam (MOBIC) 15 MG tablet   Oral   Take 1 tablet (15 mg total) by mouth daily.   30 tablet   0   . metFORMIN (GLUCOPHAGE) 1000 MG tablet   Oral   Take 1 tablet (1,000 mg total) by mouth 2 (two) times daily with a meal.   60 tablet   0   . metFORMIN (GLUCOPHAGE) 1000 MG tablet   Oral   Take 1 tablet (1,000 mg total) by mouth 2 (two) times daily with  a meal.   60 tablet   0   . traMADol (ULTRAM) 50 MG tablet   Oral   Take 1 tablet (50 mg total) by mouth every 6 (six) hours as needed.   9 tablet   0     Allergies Review of patient's allergies indicates no known allergies.  No family history on file.  Social History Social History  Substance Use Topics  . Smoking status: Never Smoker   . Smokeless tobacco: None  . Alcohol Use: Yes    Review of Systems Constitutional: No fever/chills Eyes: No visual changes. ENT: No sore throat. Cardiovascular: Denies chest pain. Respiratory: Denies shortness of breath. Gastrointestinal:  No constipation. Genitourinary: Negative for dysuria. Musculoskeletal: As above Skin: Negative for rash. Neurological: Negative for headaches, focal weakness or numbness.  10-point ROS otherwise negative.  ____________________________________________   PHYSICAL EXAM:  VITAL SIGNS: ED Triage Vitals  Enc Vitals Group     BP 10/21/15  1838 153/92 mmHg     Pulse Rate 10/21/15 1838 106     Resp 10/21/15 1838 20     Temp 10/21/15 1838 98.2 F (36.8 C)     Temp Source 10/21/15 1838 Oral     SpO2 10/21/15 1838 96 %     Weight 10/21/15 1838 220 lb (99.791 kg)     Height 10/21/15 1838  (1.549 m)     Head Cir --      Peak Flow --      Pain Score 10/21/15 1839 9     Pain Loc --      Pain Edu? --      Excl. in GC? --     Constitutional: Alert and oriented. Well appearing and in no acute distress. Eyes: Conjunctivae are normal. PERRL. EOMI. Head: Atraumatic. Nose: No congestion/rhinnorhea. Mouth/Throat: Mucous membranes are moist.   Neck: No stridor.   Cardiovascular: Normal rate, regular rhythm. Grossly normal heart sounds.  Good peripheral circulation. Respiratory: Normal respiratory effort.  No retractions. Lungs CTAB. Gastrointestinal: Soft With tenderness to left upper quadrant left lower quadrant, suprapubic region as well as the right lower quadrant. No rebound or guarding. No distention. No  Left-sided CVA tenderness palpation. Musculoskeletal: No lower extremity tenderness nor edema.   Neurologic:  Normal speech and language. No gross focal neurologic deficits are appreciated.  Skin:  Skin is warm, dry and intact. No rash noted. Psychiatric: Mood and affect are normal. Speech and behavior are normal.  ____________________________________________   LABS (all labs ordered are listed, but only abnormal results are displayed)  Labs Reviewed  COMPREHENSIVE METABOLIC PANEL - Abnormal; Notable for the following:    Sodium 134 (*)    Chloride 99 (*)    Glucose, Bld 259 (*)    Total Bilirubin 0.2 (*)    All other components within normal limits  CBC - Abnormal; Notable for the following:    WBC 14.4 (*)    All other components within normal limits  URINALYSIS COMPLETEWITH MICROSCOPIC ([facility] ONLY) - Abnormal; Notable for the following:    Color, Urine YELLOW (*)    APPearance HAZY (*)    Glucose, UA >500  (*)    Ketones, ur TRACE (*)    Hgb urine dipstick 1+ (*)    Protein, ur 100 (*)    Squamous Epithelial / LPF 6-30 (*)    All other components within normal limits  C DIFFICILE QUICK SCREEN W PCR REFLEX  GASTROINTESTINAL PANEL BY PCR, STOOL (REPLACES STOOL CULTURE)  LIPASE, BLOOD  POC URINE PREG, ED  POCT PREGNANCY, URINE   ____________________________________________  EKG   ____________________________________________  RADIOLOGY  Pending CAT scan of the abdomen and pelvis. ____________________________________________   PROCEDURES   ____________________________________________   INITIAL IMPRESSION / ASSESSMENT AND PLAN / ED COURSE  Pertinent labs & imaging results that were available during my care of the patient were reviewed by me and considered in my medical decision making (see chart for details).  ----------------------------------------- 10:47 PM on 10/21/2015 -----------------------------------------  Patient pending CAT scan at this time. Pursuit CAT scan because of elevated white blood cell count. Signed out to Dr. Zenda Alpers. ____________________________________________   FINAL CLINICAL IMPRESSION(S) / ED DIAGNOSES  Abdominal pain with nausea vomiting and diarrhea.    NEW MEDICATIONS STARTED DURING THIS VISIT:  New Prescriptions   No medications on file     Note:  This document was prepared using Dragon voice recognition software and may include unintentional dictation errors.    Myrna Blazer, MD 10/21/15 [phone]

## 2015-10-21 NOTE — ED Notes (Signed)
Pt attempted to give stool specimen. Pt unable to go at this time

## 2015-10-22 ENCOUNTER — Emergency Department: Payer: Self-pay

## 2015-10-22 NOTE — ED Notes (Signed)
C difficile quick scan w PCR reflex order d/c'd and re-entered d/t lab report that specimen collected and sent for testing was not adequate.

## 2015-10-22 NOTE — ED Provider Notes (Signed)
-----------------------------------------   3:23 AM on 10/22/2015 -----------------------------------------   Blood pressure 107/50, pulse 77, temperature 98.7 F (37.1 C), temperature source Oral, resp. rate 16, height 5\' 1"  (1.549 m), weight 220 lb (99.791 kg), last menstrual period 10/10/2015, SpO2 97 %.  Assuming care from Dr. Pershing ProudSchaevitz.  In short, Virginia Peterson is a 40 y.o. female with a chief complaint of Abdominal Pain; Emesis; and Diarrhea .  Refer to the original H&P for additional details.  The current plan of care is to follow up the results of the CT scan.   CT abdomen and pelvis: A 2.5 cm left ovarian dominant follicle/cyst ultrasound may provide better evaluation of pelvic structures, fatty liver, no evidence of bowel obstruction or active inflammation, normal appendix.  Pelvic ultrasound: 2.9 cm complex cystic structure in the left ovary consistent with hemorrhagic cyst based on size and premenopausal status no follow-up is indicated. Examination is otherwise normal, no evidence of ovarian torsion.  Patient be discharged home to follow-up with [name]. Otherwise the patient will be discharged home. She had no vomiting or diarrhea while here in the emergency department.  Rebecka ApleyAllison P Teryl Gubler, MD 10/22/15 [phone]

## 2015-11-18 ENCOUNTER — Encounter: Payer: Self-pay | Admitting: Emergency Medicine

## 2015-11-18 ENCOUNTER — Emergency Department: Payer: Self-pay

## 2015-11-18 ENCOUNTER — Emergency Department
Admission: EM | Admit: 2015-11-18 | Discharge: 2015-11-18 | Disposition: A | Discharge status: Home / Self Care | Payer: Self-pay | Attending: Emergency Medicine | Admitting: Emergency Medicine

## 2015-11-18 DIAGNOSIS — Z7984 Long term (current) use of oral hypoglycemic drugs: Secondary | ICD-10-CM | POA: Insufficient documentation

## 2015-11-18 DIAGNOSIS — E119 Type 2 diabetes mellitus without complications: Secondary | ICD-10-CM | POA: Insufficient documentation

## 2015-11-18 DIAGNOSIS — Z79899 Other long term (current) drug therapy: Secondary | ICD-10-CM | POA: Insufficient documentation

## 2015-11-18 DIAGNOSIS — J45909 Unspecified asthma, uncomplicated: Secondary | ICD-10-CM | POA: Insufficient documentation

## 2015-11-18 DIAGNOSIS — F329 Major depressive disorder, single episode, unspecified: Secondary | ICD-10-CM | POA: Insufficient documentation

## 2015-11-18 DIAGNOSIS — M7989 Other specified soft tissue disorders: Secondary | ICD-10-CM | POA: Insufficient documentation

## 2015-11-18 DIAGNOSIS — I10 Essential (primary) hypertension: Secondary | ICD-10-CM | POA: Insufficient documentation

## 2015-11-18 DIAGNOSIS — Z760 Encounter for issue of repeat prescription: Secondary | ICD-10-CM | POA: Insufficient documentation

## 2015-11-18 LAB — BASIC METABOLIC PANEL
BUN: 16 mg/dL (ref 6–20)
CALCIUM: UNDETERMINED mg/dL (ref 8.9–10.3)
CO2: UNDETERMINED mmol/L (ref 22–32)
Chloride: 99 mmol/L — ABNORMAL LOW (ref 101–111)
Creatinine, Ser: 0.71 mg/dL (ref 0.44–1.00)
GFR calc Af Amer: >60 mL/min (ref >60)
Glucose, Bld: UNDETERMINED mg/dL (ref 65–99)
POTASSIUM: UNDETERMINED mmol/L (ref 3.5–5.1)
SODIUM: UNDETERMINED mmol/L (ref 135–145)

## 2015-11-18 LAB — CBC
HCT: 35.5 % (ref 35.0–47.0)
Hemoglobin: 12.7 g/dL (ref 12.0–16.0)
MCH: 29.4 pg (ref 26.0–34.0)
MCHC: 35.9 g/dL (ref 32.0–36.0)
MCV: 81.8 fL (ref 80.0–100.0)
PLATELETS: 399 10*3/uL (ref 150–440)
RBC: 4.34 MIL/uL (ref 3.80–5.20)
RDW: 12.7 % (ref 11.5–14.5)
WBC: 8.6 10*3/uL (ref 3.6–11.0)

## 2015-11-18 MED ORDER — NAPROXEN 500 MG PO TABS: 500.0000 mg | ORAL_TABLET | Freq: Two times a day (BID) | ORAL | Status: DC

## 2015-11-18 MED ORDER — TRAMADOL HCL 50 MG PO TABS: 50.0000 mg | ORAL_TABLET | Freq: Four times a day (QID) | ORAL | Status: DC | PRN

## 2015-11-18 MED ORDER — PRAVASTATIN SODIUM 10 MG PO TABS: 10.0000 mg | ORAL_TABLET | Freq: Every day | ORAL | Status: DC

## 2015-11-18 MED ORDER — IBUPROFEN 800 MG PO TABS
800.0000 mg | ORAL_TABLET | Freq: Once | ORAL | Status: AC
  Administered 2015-11-18: 800 mg via ORAL
  Filled 2015-11-18: qty 1

## 2015-11-18 NOTE — ED Notes (Signed)
Pt comes in c/o left ankle pain and swelling. Reports that she went for a pedicure yesterday and the lady who did her pedicure pointed out that her foot and ankle were swollen. Pt had noticed some pain in her left ankle but "hadn't paid attention to it" until her pedicurist pointed out the swelling. Left ankle slightly larger than the right. Some redness that appears to be irritation to the skin more than redness from infection or injury. Pt denies any recent injury to the ankle.

## 2015-11-18 NOTE — ED Provider Notes (Signed)
CSN: [id number]     Arrival date & time 11/18/15  1900 History   First MD Initiated Contact with Patient 11/18/15 2014     Chief Complaint  Patient presents with  . Ankle Pain     (Consider location/radiation/quality/duration/timing/severity/associated sxs/prior Treatment) HPI  40 year old female with history of diabetes, hypertension, anxiety, depression presents with 2 day history of left lower leg pain. She describes the pain as tightness throughout the calf and into the ankle. She's injured her ankle many years ago and has had some mild swelling in this ankle. She denies any recent trauma or injury. She is ambulatory with a 7 out of 10 pain. She denies any warmth or redness. She denies any numbness or tingling. Patient has no history of DVTs, no recent car ride or flight, no history of cancer, she does not smoke, no estrogen therapy.  Past Medical History  Diagnosis Date  . Diabetes mellitus without complication (HCC)   . Asthma   . Depressed   . Anxiety   . Hypertension    Past Surgical History  Procedure Laterality Date  . Neck surgery     History reviewed. No pertinent family history. Social History  Substance Use Topics  . Smoking status: Never Smoker   . Smokeless tobacco: None  . Alcohol Use: Yes   OB History    Gravida Para Term Preterm AB TAB SAB Ectopic Multiple Living   1         1     Review of Systems  Constitutional: Negative for fever, chills, activity change and fatigue.  HENT: Negative for congestion, sinus pressure and sore throat.   Eyes: Negative for visual disturbance.  Respiratory: Negative for cough, chest tightness and shortness of breath.   Cardiovascular: Negative for chest pain and leg swelling.  Gastrointestinal: Negative for nausea, vomiting, abdominal pain and diarrhea.  Genitourinary: Negative for dysuria.  Musculoskeletal: Positive for myalgias. Negative for gait problem.  Skin: Negative for rash.  Neurological: Negative for weakness,  numbness and headaches.  Hematological: Negative for adenopathy.  Psychiatric/Behavioral: Negative for behavioral problems, confusion and agitation.      Allergies  Review of patient's allergies indicates no known allergies.  Home Medications   Prior to Admission medications   Medication Sig Start Date End Date Taking? Authorizing Provider  albuterol (PROVENTIL HFA;VENTOLIN HFA) 108 (90 BASE) MCG/ACT inhaler Inhale 2 puffs into the lungs every 6 (six) hours as needed for wheezing or shortness of breath. 04/25/15   Tommi Rumpshonda L Summers, PA-C  benzonatate (TESSALON PERLES) 100 MG capsule Take 1 capsule (100 mg total) by mouth 3 (three) times daily as needed for cough. 05/05/15 05/04/16  Darci Currentandolph N Brown, MD  Cetirizine HCl 10 MG CAPS Take 1 capsule (10 mg total) by mouth daily. 09/21/15   Chinita Pesterari B Triplett, FNP  dicyclomine (BENTYL) 20 MG tablet Take 1 tablet (20 mg total) by mouth 3 (three) times daily as needed for spasms. 10/21/15 10/20/16  Myrna Blazeravid Matthew Schaevitz, MD  glipiZIDE (GLUCOTROL) 10 MG tablet Take 1 tablet (10 mg total) by mouth 2 (two) times daily before a meal. 09/10/15 09/09/16  Myrna Blazeravid Matthew Schaevitz, MD  glipiZIDE (GLUCOTROL) 10 MG tablet Take 1 tablet (10 mg total) by mouth 2 (two) times daily before a meal. 09/10/15 09/09/16  Myrna Blazeravid Matthew Schaevitz, MD  lisinopril (PRINIVIL,ZESTRIL) 10 MG tablet Take 1 tablet (10 mg total) by mouth daily. 05/05/15   Darci Currentandolph N Brown, MD  meloxicam (MOBIC) 15 MG tablet Take 1 tablet (15  mg total) by mouth daily. 09/21/15   Chinita Pester, FNP  metFORMIN (GLUCOPHAGE) 1000 MG tablet Take 1 tablet (1,000 mg total) by mouth 2 (two) times daily with a meal. 09/10/15 09/09/16  Myrna Blazer, MD  metFORMIN (GLUCOPHAGE) 1000 MG tablet Take 1 tablet (1,000 mg total) by mouth 2 (two) times daily with a meal. 09/10/15 09/09/16  Myrna Blazer, MD  naproxen (NAPROSYN) 500 MG tablet Take 1 tablet (500 mg total) by mouth 2 (two) times daily with a meal. 11/18/15    Evon Slack, PA-C  ondansetron (ZOFRAN) 4 MG tablet Take 1 tablet (4 mg total) by mouth daily as needed. 10/21/15   Myrna Blazer, MD  pravastatin (PRAVACHOL) 10 MG tablet Take 1 tablet (10 mg total) by mouth daily. 11/18/15   Evon Slack, PA-C  traMADol (ULTRAM) 50 MG tablet Take 1 tablet (50 mg total) by mouth every 6 (six) hours as needed. 11/18/15   Evon Slack, PA-C   BP 144/74 mmHg  Pulse 83  Resp 18  Ht 5\' 1"  (1.549 m)  Wt 99.791 kg  BMI 41.59 kg/m2  SpO2 95%  LMP 11/13/2015 (Exact Date) Physical Exam  Constitutional: She is oriented to person, place, and time. She appears well-developed and well-nourished. No distress.  HENT:  Head: Normocephalic and atraumatic.  Mouth/Throat: Oropharynx is clear and moist.  Eyes: EOM are normal. Pupils are equal, round, and reactive to light. Right eye exhibits no discharge. Left eye exhibits no discharge.  Neck: Normal range of motion. Neck supple.  Cardiovascular: Normal rate, regular rhythm and intact distal pulses.   Pulmonary/Chest: Effort normal and breath sounds normal. No respiratory distress. She exhibits no tenderness.  Abdominal: Soft. She exhibits no distension. There is no tenderness.  Musculoskeletal:  Examination of the left lower extremity shows the patient has mild tenderness to palpation to the calf with no warmth or erythema present. There is 1.5 cm difference in Circumference, 50 cm on the left and 48.5 cm on the right. Patient has good ankle plantarflexion dorsiflexion. She has 2+ dorsalis pedis pulse. She is nontender along the midfoot, fifth metatarsal, calcaneus, medial or lateral malleolus, anterior tibia. There is no pitting edema. She has full active range of motion of the lower extremity.  Neurological: She is alert and oriented to person, place, and time. She has normal reflexes.  Skin: Skin is warm and dry.  Psychiatric: She has a normal mood and affect. Her behavior is normal. Thought content  normal.    ED Course  Procedures (including critical care time) Labs Review Labs Reviewed  BASIC METABOLIC PANEL - Abnormal; Notable for the following:    Chloride 99 (*)    All other components within normal limits  CBC    Imaging Review US Venous Img Lower Unilateral Left  11/18/2015  CLINICAL DATA:  Left lower leg swelling and pain 2 days. EXAM: LEFT LOWER EXTREMITY VENOUS DOPPLER ULTRASOUND TECHNIQUE: Gray-scale sonography with graded compression, as well as color Doppler and duplex ultrasound were performed to evaluate the lower extremity deep venous systems from the level of the common femoral vein and including the common femoral, femoral, profunda femoral, popliteal and calf veins including the posterior tibial, peroneal and gastrocnemius veins when visible. The superficial great saphenous vein was also interrogated. Spectral Doppler was utilized to evaluate flow at rest and with distal augmentation maneuvers in the common femoral, femoral and popliteal veins. COMPARISON:  None. FINDINGS: Contralateral Common Femoral Vein: Respiratory phasicity is  normal and symmetric with the symptomatic side. No evidence of thrombus. Normal compressibility. Common Femoral Vein: No evidence of thrombus. Normal compressibility, respiratory phasicity and response to augmentation. Saphenofemoral Junction: No evidence of thrombus. Normal compressibility and flow on color Doppler imaging. Profunda Femoral Vein: No evidence of thrombus. Normal compressibility and flow on color Doppler imaging. Femoral Vein: No evidence of thrombus. Normal compressibility, respiratory phasicity and response to augmentation. Popliteal Vein: No evidence of thrombus. Normal compressibility, respiratory phasicity and response to augmentation. Calf Veins: No evidence of thrombus. Normal compressibility and flow on color Doppler imaging. Superficial Great Saphenous Vein: No evidence of thrombus. Normal compressibility and flow on color  Doppler imaging. Venous Reflux:  None. Other Findings:  None. IMPRESSION: No evidence of deep venous thrombosis. Electronically Signed   By: Elberta Fortis M.D.   On: 11/18/2015 21:15   I have personally reviewed and evaluated these images and lab results as part of my medical decision-making.   EKG Interpretation None      MDM   Final diagnoses:  Left leg swelling  Medication refill  40 year old female with mild left lower leg swelling. No trauma or injury. Ultrasound negative for DVT. No signs of infection on exam She will keep elevated, recommend compression stockings. Recommend patient to establish PCP. Patient notes she is also out of her cholesterol medication will refill this today. She is educated on signs and symptoms return to the emergency department for.    Evon Slack, PA-C 11/18/15 2237  Minna Antis, MD 11/18/15 2250

## 2015-11-18 NOTE — Discharge Instructions (Signed)
Medicine Refill at the Emergency Department  We have refilled your medicine today, but it is best for you to get refills through your primary health care provider's office. In the future, please plan ahead so you do not need to get refills from the emergency department.  If the medicine we refilled was a maintenance medicine, you may have received only enough to get you by until you are able to see your regular health care provider.  This information is not intended to replace advice given to you by your health care provider. Make sure you discuss any questions you have with your health care provider.  Document Released: 09/07/2003 Document Revised: 06/11/2014 Document Reviewed: 08/28/2013  Elsevier Interactive Patient Education 2016 [employer]   Please purchase over-the-counter compression stockings, knee-high. Wear during the day, remove at nighttime. Return to the ER for any increased pain and swelling warmth redness or fevers. Please  establish primary care physician. we are going to refill your cholesterol medication today.

## 2015-11-18 NOTE — ED Notes (Signed)
Discharge instructions reviewed with patient. Patient verbalized understanding. Patient taken to lobby via wheelchair by family 

## 2015-12-04 ENCOUNTER — Emergency Department: Payer: BLUE CROSS/BLUE SHIELD

## 2015-12-04 ENCOUNTER — Emergency Department
Admission: EM | Admit: 2015-12-04 | Discharge: 2015-12-04 | Disposition: A | Discharge status: Home / Self Care | Payer: BLUE CROSS/BLUE SHIELD | Attending: Emergency Medicine | Admitting: Emergency Medicine

## 2015-12-04 ENCOUNTER — Encounter: Payer: Self-pay | Admitting: Emergency Medicine

## 2015-12-04 DIAGNOSIS — J45909 Unspecified asthma, uncomplicated: Secondary | ICD-10-CM | POA: Insufficient documentation

## 2015-12-04 DIAGNOSIS — F329 Major depressive disorder, single episode, unspecified: Secondary | ICD-10-CM | POA: Insufficient documentation

## 2015-12-04 DIAGNOSIS — F4323 Adjustment disorder with mixed anxiety and depressed mood: Secondary | ICD-10-CM | POA: Insufficient documentation

## 2015-12-04 DIAGNOSIS — I10 Essential (primary) hypertension: Secondary | ICD-10-CM | POA: Insufficient documentation

## 2015-12-04 DIAGNOSIS — E119 Type 2 diabetes mellitus without complications: Secondary | ICD-10-CM | POA: Insufficient documentation

## 2015-12-04 DIAGNOSIS — Z7984 Long term (current) use of oral hypoglycemic drugs: Secondary | ICD-10-CM | POA: Insufficient documentation

## 2015-12-04 DIAGNOSIS — Z79899 Other long term (current) drug therapy: Secondary | ICD-10-CM | POA: Insufficient documentation

## 2015-12-04 DIAGNOSIS — R0789 Other chest pain: Secondary | ICD-10-CM

## 2015-12-04 DIAGNOSIS — F419 Anxiety disorder, unspecified: Secondary | ICD-10-CM | POA: Insufficient documentation

## 2015-12-04 LAB — BASIC METABOLIC PANEL
ANION GAP: 9 (ref 5–15)
BUN: 13 mg/dL (ref 6–20)
CALCIUM: 9.2 mg/dL (ref 8.9–10.3)
CO2: 23 mmol/L (ref 22–32)
CREATININE: 0.65 mg/dL (ref 0.44–1.00)
Chloride: 100 mmol/L — ABNORMAL LOW (ref 101–111)
GFR calc Af Amer: >60 mL/min (ref >60)
GFR calc non Af Amer: >60 mL/min (ref >60)
GLUCOSE: 201 mg/dL — AB (ref 65–99)
Potassium: 4 mmol/L (ref 3.5–5.1)
Sodium: 132 mmol/L — ABNORMAL LOW (ref 135–145)

## 2015-12-04 LAB — CBC
HCT: 35.9 % (ref 35.0–47.0)
HEMOGLOBIN: 12.4 g/dL (ref 12.0–16.0)
MCH: 28.6 pg (ref 26.0–34.0)
MCHC: 34.5 g/dL (ref 32.0–36.0)
MCV: 82.8 fL (ref 80.0–100.0)
Platelets: 313 10*3/uL (ref 150–440)
RBC: 4.34 MIL/uL (ref 3.80–5.20)
RDW: 13.5 % (ref 11.5–14.5)
WBC: 8.3 10*3/uL (ref 3.6–11.0)

## 2015-12-04 LAB — TROPONIN I

## 2015-12-04 MED ORDER — ALPRAZOLAM 0.5 MG PO TABS: 1.0000 mg | ORAL_TABLET | ORAL | Status: DC

## 2015-12-04 MED ORDER — ALPRAZOLAM 0.5 MG PO TABS
0.5000 mg | ORAL_TABLET | Freq: Once | ORAL | Status: AC
  Administered 2015-12-04: 0.5 mg via ORAL
  Filled 2015-12-04: qty 1

## 2015-12-04 NOTE — ED Notes (Signed)
Pt presents with chest pain following a verbal argument with her mother; pt says she went into the kitchen where family was sitting this evening and felt like everyone was talking about her; pt says she started screaming at everyone; pt says she's been very stressed-almost the 17th anniversary of her brother's death, waiting to find out about housing (living with her family); "just a lot going on"; says she called [facility] and told them she was having trouble breathing and a panic attack; pt admits to telling [facility] that she had thoughts of overdosing on Xanax "just to calm my nerves down" (taking more than she's prescribed but not wanting to die)-pt says "honestly I'm not suicidal"; pt says just feeling overwhelmed with what's going on;

## 2015-12-04 NOTE — ED Notes (Signed)
Patient was fighting with family today. States she had a big argument with her family and began having chest pain after. States she does not know if it is her asthma or a panic attack or a heart attack.

## 2015-12-04 NOTE — Discharge Instructions (Signed)
You have been seen in the Emergency Department (ED) today for chest pain.  As we have discussed todays test results are normal, but you may require further testing. Also, please follow-up closely with [facility] tomorrow morning.  Please follow up with the recommended doctor as instructed above in these documents regarding todays emergent visit and your recent symptoms to discuss further management.  Continue to take your regular medications. If you are not doing so already, please also take a daily baby aspirin (81 mg), at least until you follow up with your doctor.  Return to the Emergency Department (ED) if you experience any further chest pain/pressure/tightness, difficulty breathing, or sudden sweating, or other symptoms that concern you.    Chest Pain Observation It is often hard to give a specific diagnosis for the cause of chest pain. Among other possibilities your symptoms might be caused by inadequate oxygen delivery to your heart (angina). Angina that is not treated or evaluated can lead to a heart attack (myocardial infarction) or death. Blood tests, electrocardiograms, and X-rays may have been done to help determine a possible cause of your chest pain. After evaluation and observation, your health care provider has determined that it is unlikely your pain was caused by an unstable condition that requires hospitalization. However, a full evaluation of your pain may need to be completed, with additional diagnostic testing as directed. It is very important to keep your follow-up appointments. Not keeping your follow-up appointments could result in permanent heart damage, disability, or death. If there is any problem keeping your follow-up appointments, you must call your health care provider. HOME CARE INSTRUCTIONS  Due to the slight chance that your pain could be angina, it is important to follow your health care provider's treatment plan and also maintain a healthy lifestyle:  Maintain or work  toward achieving a healthy weight.  Stay physically active and exercise regularly.  Decrease your salt intake.  Eat a balanced, healthy diet. Talk to a dietitian to learn about heart-healthy foods.  Increase your fiber intake by including whole grains, vegetables, fruits, and nuts in your diet.  Avoid situations that cause stress, anger, or depression.  Take medicines as advised by your health care provider. Report any side effects to your health care provider. Do not stop medicines or adjust the dosages on your own.  Quit smoking. Do not use nicotine patches or gum until you check with your health care provider.  Keep your blood pressure, blood sugar, and cholesterol levels within normal limits.  Limit alcohol intake to no more than 1 drink per day for women who are not pregnant and 2 drinks per day for men.  Do not abuse drugs. SEEK IMMEDIATE MEDICAL CARE IF: You have severe chest pain or pressure which may include symptoms such as:  You feel pain or pressure in your arms, neck, jaw, or back.  You have severe back or abdominal pain, feel sick to your stomach (nauseous), or throw up (vomit).  You are sweating profusely.  You are having a fast or irregular heartbeat.  You feel short of breath while at rest.  You notice increasing shortness of breath during rest, sleep, or with activity.  You have chest pain that does not get better after rest or after taking your usual medicine.  You wake from sleep with chest pain.  You are unable to sleep because you cannot breathe.  You develop a frequent cough or you are coughing up blood.  You feel dizzy, faint, or experience  extreme fatigue.  You develop severe weakness, dizziness, fainting, or chills. Any of these symptoms may represent a serious problem that is an emergency. Do not wait to see if the symptoms will go away. Call your local emergency services (911 in the [place] Do not drive yourself to the hospital. MAKE SURE  YOU:  Understand these instructions.  Will watch your condition.  Will get help right away if you are not doing well or get worse.   This information is not intended to replace advice given to you by your health care provider. Make sure you discuss any questions you have with your health care provider.   Document Released: 06/23/2010 Document Revised: 05/26/2013 Document Reviewed: 11/20/2012 Elsevier Interactive Patient Education [employer]2016 Elsevier Inc.

## 2015-12-04 NOTE — ED Provider Notes (Signed)
[facility] Emergency Department Provider Note  ____________________________________________  Time seen: Approximately 8:42 PM  I have reviewed the triage vital signs and the nursing notes.   HISTORY  Chief Complaint Chest Pain; Panic Attack; and Depression    HPI Virginia Peterson is a 40 y.o. female presents for evaluation of a sharp discomfort in her left chest. He reports this started after she had a yelling argument with her mom. She reports that she sometimes has trouble with her mother, and a long history of anxiety. She reports she felt short of breath after yelling at her mother, and then experienced a somewhat mild sharp discomfort in the left chest. Not associated radiation, nausea or vomiting. No headache.  She reports she thought about taking extra Xanax, but did not because of the anxiety. Adamantly denies any suicidal thoughts or wanting to harm anyone else. No hallucinations. She is followed closely by [facility] as well as mobile crisis.  Past Medical History  Diagnosis Date  . Diabetes mellitus without complication (HCC)   . Asthma   . Depressed   . Anxiety   . Hypertension     Patient Active Problem List   Diagnosis Date Noted  . Dysthymia 10/06/2015  . Adjustment disorder with mixed anxiety and depressed mood 10/06/2015    Past Surgical History  Procedure Laterality Date  . Neck surgery      Current Outpatient Rx  Name  Route  Sig  Dispense  Refill  . albuterol (PROVENTIL HFA;VENTOLIN HFA) 108 (90 BASE) MCG/ACT inhaler   Inhalation   Inhale 2 puffs into the lungs every 6 (six) hours as needed for wheezing or shortness of breath.   1 Inhaler   2   . benzonatate (TESSALON PERLES) 100 MG capsule   Oral   Take 1 capsule (100 mg total) by mouth 3 (three) times daily as needed for cough.   30 capsule   0   . Cetirizine HCl 10 MG CAPS   Oral   Take 1 capsule (10 mg total) by mouth daily.   30 capsule   0   . dicyclomine (BENTYL) 20  MG tablet   Oral   Take 1 tablet (20 mg total) by mouth 3 (three) times daily as needed for spasms.   15 tablet   0   . glipiZIDE (GLUCOTROL) 10 MG tablet   Oral   Take 1 tablet (10 mg total) by mouth 2 (two) times daily before a meal.   60 tablet   0   . glipiZIDE (GLUCOTROL) 10 MG tablet   Oral   Take 1 tablet (10 mg total) by mouth 2 (two) times daily before a meal.   60 tablet   0   . lisinopril (PRINIVIL,ZESTRIL) 10 MG tablet   Oral   Take 1 tablet (10 mg total) by mouth daily.   90 tablet   0   . meloxicam (MOBIC) 15 MG tablet   Oral   Take 1 tablet (15 mg total) by mouth daily.   30 tablet   0   . metFORMIN (GLUCOPHAGE) 1000 MG tablet   Oral   Take 1 tablet (1,000 mg total) by mouth 2 (two) times daily with a meal.   60 tablet   0   . metFORMIN (GLUCOPHAGE) 1000 MG tablet   Oral   Take 1 tablet (1,000 mg total) by mouth 2 (two) times daily with a meal.   60 tablet   0   . naproxen (NAPROSYN)  500 MG tablet   Oral   Take 1 tablet (500 mg total) by mouth 2 (two) times daily with a meal.   30 tablet   0   . ondansetron (ZOFRAN) 4 MG tablet   Oral   Take 1 tablet (4 mg total) by mouth daily as needed.   10 tablet   0   . pravastatin (PRAVACHOL) 10 MG tablet   Oral   Take 1 tablet (10 mg total) by mouth daily.   30 tablet   0   . traMADol (ULTRAM) 50 MG tablet   Oral   Take 1 tablet (50 mg total) by mouth every 6 (six) hours as needed.   20 tablet   0     Allergies Review of patient's allergies indicates no known allergies.  History reviewed. No pertinent family history.  Social History Social History  Substance Use Topics  . Smoking status: Never Smoker   . Smokeless tobacco: None  . Alcohol Use: Yes    Review of Systems Constitutional: No fever/chills Eyes: No visual changes. ENT: No sore throat. Cardiovascular: See history of present illness Respiratory: See history of present illness  Gastrointestinal: No abdominal pain.  No  nausea, no vomiting.  No diarrhea.  No constipation. Genitourinary: Negative for dysuria. Musculoskeletal: Negative for back pain. Skin: Negative for rash. Neurological: Negative for headaches, focal weakness or numbness.  No recent long trips or travel, no leg swelling, does not take any birth control or hormones, denies any previous history of any blood clots, denies any smoking. No recent surgery.  10-point ROS otherwise negative.  ____________________________________________   PHYSICAL EXAM:  VITAL SIGNS: ED Triage Vitals  Enc Vitals Group     BP 12/04/15 2018 150/78 mmHg     Pulse Rate 12/04/15 2018 95     Resp 12/04/15 2018 18     Temp 12/04/15 2018 98.6 F (37 C)     Temp Source 12/04/15 2018 Oral     SpO2 12/04/15 2018 100 %     Weight 12/04/15 2018 240 lb (108.863 kg)     Height 12/04/15 2018 5\' 1"  (1.549 m)     Head Cir --      Peak Flow --      Pain Score 12/04/15 2016 9     Pain Loc --      Pain Edu? --      Excl. in GC? --    Constitutional: Alert and oriented. Well appearing and in no acute distress. Eyes: Conjunctivae are normal. PERRL. EOMI. Head: Atraumatic. Nose: No congestion/rhinnorhea. Mouth/Throat: Mucous membranes are moist.  Oropharynx non-erythematous. Neck: No stridor.   Cardiovascular: Normal rate, regular rhythm. Grossly normal heart sounds.  Good peripheral circulation. Respiratory: Normal respiratory effort.  No retractions. Lungs CTAB. Gastrointestinal: Soft and nontender. No distention. No abdominal bruits. No CVA tenderness. Musculoskeletal: No lower extremity tenderness nor edema.  Neurologic:  Normal speech and language. No gross focal neurologic deficits are appreciated. No gait instability. Skin:  Skin is warm, dry and intact. No rash noted. Psychiatric: Mood and affect are normal. Speech and behavior are normal.  ____________________________________________   LABS (all labs ordered are listed, but only abnormal results are  displayed)  Labs Reviewed  BASIC METABOLIC PANEL - Abnormal; Notable for the following:    Sodium 132 (*)    Chloride 100 (*)    Glucose, Bld 201 (*)    All other components within normal limits  CBC  TROPONIN I   ____________________________________________  EKG  ED ECG REPORT I, Peityn Payton, the attending physician, personally viewed and interpreted this ECG.  Date: 12/04/2015 EKG Time: 2020 Rate: 90 Rhythm: normal sinus rhythm QRS Axis: normal Intervals: normal ST/T Wave abnormalities: normal Conduction Disturbances: none Narrative Interpretation: unremarkable, some very minimal artifact is noted but felt to be a normal 12-lead  ____________________________________________  RADIOLOGY  DG Chest 2 View (Final result) Result time: 12/04/15 21:18:41   Final result by Rad Results In Interface (12/04/15 21:18:41)   Narrative:   CLINICAL DATA: Chest pain shortness of breath from anxiety. Asthma.  EXAM: CHEST 2 VIEW  COMPARISON: 09/21/2015  FINDINGS: Lungs are hypoinflated without consolidation or effusion. Cardiomediastinal silhouette is within normal. Mild prominence of the overlying soft tissues normal underlying bony structures.  IMPRESSION: Hypoinflation without acute cardiopulmonary disease.   Electronically Signed By: Elberta Fortisaniel Boyle M.D. On: 12/04/2015 21:18    ____________________________________________   PROCEDURES  Procedure(s) performed: None  Critical Care performed: No  ____________________________________________   INITIAL IMPRESSION / ASSESSMENT AND PLAN / ED COURSE  Pertinent labs & imaging results that were available during my care of the patient were reviewed by me and considered in my medical decision making (see chart for details).  His parents for evaluation of atypical chest pain described as a light sharp discomfort over the left chest which started while yelling at her mother. She does report she has a history of  anxiety, and denies any acute psychiatric symptoms. She did consider taking extra Xanax to assist with her anxiety symptoms, but did not do so as she tells me that she thought that could be dangerous and the mobile crisis center warned her not to.  She denies any suicidal thoughts, homicidal or harmful ideations. Her chest pain is very atypical in nature, no significant risk factors for coronary disease. Very reassuring with normal cardiac and pulmonary examination.  We'll send a single troponin, if negative patient is felt to be low risk likely discharge to home. We did discuss and she follows closely with [facility] and she she'll call them tomorrow for further follow-up.  Troponin normal. Chest x-ray clear. No evidence of acute cardiac or pulmonary process. Asian resting comfortably at this time.      Pulmonary Embolism Rule-out Criteria (PERC rule)                        If YES to ANY of the following, the PERC rule is not satisfied and cannot be used to rule out PE in this patient (consider d-dimer or imaging depending on pre-test probability).                      If NO to ALL of the following, AND the clinician's pre-test probability is <15%, the [facility] rule is satisfied and there is no need for further workup (including no need to obtain a d-dimer) as the post-test probability of pulmonary embolism is <2%.                      Mnemonic is HAD CLOTS   H - hormone use (exogenous estrogen)      No. A - age > 50                                                 No.  D - DVT/PE history                                      No.   C - coughing blood (hemoptysis)                 No. L - leg swelling, unilateral                             No. O - O2 Sat on Room Air < 95%                  No. T - tachycardia (HR ? 100)                         No. S - surgery or trauma, recent                      No.   Based on my evaluation of the patient, including application of this decision instrument, further  testing to evaluate for pulmonary embolism is not indicated at this time.   Return precautions and treatment recommendations and follow-up discussed with the patient who is agreeable with the plan.  Patient going home with her husband. She is agreeable to calling and following up with [facility] in the morning. Will follow-up with her PCP if ongoing discomfort in the chest, though at present she reports none. HEART score < 2. ____________________________________________   FINAL CLINICAL IMPRESSION(S) / ED DIAGNOSES  Final diagnoses:  Atypical chest pain  Anxiety      Sharyn Creamer, MD 12/04/15 2143

## 2015-12-05 ENCOUNTER — Ambulatory Visit: Payer: Self-pay

## 2015-12-16 ENCOUNTER — Ambulatory Visit: Payer: BLUE CROSS/BLUE SHIELD

## 2016-01-04 ENCOUNTER — Emergency Department
Admission: EM | Admit: 2016-01-04 | Discharge: 2016-01-04 | Disposition: A | Discharge status: Home / Self Care | Payer: Self-pay | Attending: Emergency Medicine | Admitting: Emergency Medicine

## 2016-01-04 ENCOUNTER — Emergency Department: Payer: Self-pay

## 2016-01-04 ENCOUNTER — Encounter: Payer: Self-pay | Admitting: *Deleted

## 2016-01-04 DIAGNOSIS — Z79899 Other long term (current) drug therapy: Secondary | ICD-10-CM | POA: Insufficient documentation

## 2016-01-04 DIAGNOSIS — M549 Dorsalgia, unspecified: Secondary | ICD-10-CM | POA: Insufficient documentation

## 2016-01-04 DIAGNOSIS — J45909 Unspecified asthma, uncomplicated: Secondary | ICD-10-CM | POA: Insufficient documentation

## 2016-01-04 DIAGNOSIS — I10 Essential (primary) hypertension: Secondary | ICD-10-CM | POA: Insufficient documentation

## 2016-01-04 DIAGNOSIS — Z7984 Long term (current) use of oral hypoglycemic drugs: Secondary | ICD-10-CM | POA: Insufficient documentation

## 2016-01-04 DIAGNOSIS — R112 Nausea with vomiting, unspecified: Secondary | ICD-10-CM | POA: Insufficient documentation

## 2016-01-04 DIAGNOSIS — R739 Hyperglycemia, unspecified: Secondary | ICD-10-CM

## 2016-01-04 DIAGNOSIS — E1165 Type 2 diabetes mellitus with hyperglycemia: Secondary | ICD-10-CM | POA: Insufficient documentation

## 2016-01-04 DIAGNOSIS — R1013 Epigastric pain: Secondary | ICD-10-CM | POA: Insufficient documentation

## 2016-01-04 LAB — COMPREHENSIVE METABOLIC PANEL
ALT: 23 U/L (ref 14–54)
ANION GAP: 8 (ref 5–15)
AST: 23 U/L (ref 15–41)
Albumin: 3.3 g/dL — ABNORMAL LOW (ref 3.5–5.0)
Alkaline Phosphatase: 53 U/L (ref 38–126)
BILIRUBIN TOTAL: 0.3 mg/dL (ref 0.3–1.2)
BUN: 9 mg/dL (ref 6–20)
CO2: 25 mmol/L (ref 22–32)
Calcium: 8.6 mg/dL — ABNORMAL LOW (ref 8.9–10.3)
Chloride: 99 mmol/L — ABNORMAL LOW (ref 101–111)
Creatinine, Ser: 0.52 mg/dL (ref 0.44–1.00)
Glucose, Bld: 319 mg/dL — ABNORMAL HIGH (ref 65–99)
POTASSIUM: 3.9 mmol/L (ref 3.5–5.1)
Sodium: 132 mmol/L — ABNORMAL LOW (ref 135–145)
TOTAL PROTEIN: 6.7 g/dL (ref 6.5–8.1)

## 2016-01-04 LAB — CBC
HEMATOCRIT: 37.1 % (ref 35.0–47.0)
HEMOGLOBIN: 12.6 g/dL (ref 12.0–16.0)
MCH: 28.2 pg (ref 26.0–34.0)
MCHC: 34 g/dL (ref 32.0–36.0)
MCV: 82.9 fL (ref 80.0–100.0)
Platelets: 313 10*3/uL (ref 150–440)
RBC: 4.48 MIL/uL (ref 3.80–5.20)
RDW: 13.6 % (ref 11.5–14.5)
WBC: 7.8 10*3/uL (ref 3.6–11.0)

## 2016-01-04 LAB — URINALYSIS COMPLETE WITH MICROSCOPIC (ARMC ONLY)
BILIRUBIN URINE: NEGATIVE
Glucose, UA: >500 mg/dL — AB
HGB URINE DIPSTICK: NEGATIVE
LEUKOCYTES UA: NEGATIVE
NITRITE: NEGATIVE
PH: 6 (ref 5.0–8.0)
Protein, ur: NEGATIVE mg/dL
Specific Gravity, Urine: 1.027 (ref 1.005–1.030)

## 2016-01-04 LAB — GLUCOSE, CAPILLARY: Glucose-Capillary: 311 mg/dL — ABNORMAL HIGH (ref 65–99)

## 2016-01-04 LAB — LIPASE, BLOOD: Lipase: 31 U/L (ref 11–51)

## 2016-01-04 MED ORDER — MORPHINE SULFATE (PF) 4 MG/ML IV SOLN
4.0000 mg | Freq: Once | INTRAVENOUS | Status: AC
  Administered 2016-01-04: 4 mg via INTRAVENOUS

## 2016-01-04 MED ORDER — SODIUM CHLORIDE 0.9 % IV SOLN
Freq: Once | INTRAVENOUS | Status: AC
  Administered 2016-01-04: 19:00:00 via INTRAVENOUS

## 2016-01-04 MED ORDER — MORPHINE SULFATE (PF) 4 MG/ML IV SOLN
INTRAVENOUS | Status: AC
  Administered 2016-01-04: 4 mg via INTRAVENOUS
  Filled 2016-01-04: qty 1

## 2016-01-04 MED ORDER — MORPHINE SULFATE (PF) 4 MG/ML IV SOLN
4.0000 mg | Freq: Once | INTRAVENOUS | Status: AC
  Administered 2016-01-04: 4 mg via INTRAVENOUS
  Filled 2016-01-04: qty 1

## 2016-01-04 MED ORDER — METOCLOPRAMIDE HCL 10 MG PO TABS: 10.0000 mg | ORAL_TABLET | Freq: Three times a day (TID) | ORAL | 1 refills | Status: DC | PRN

## 2016-01-04 MED ORDER — OXYCODONE-ACETAMINOPHEN 5-325 MG PO TABS: 2.0000 | ORAL_TABLET | Freq: Four times a day (QID) | ORAL | 0 refills | Status: DC | PRN

## 2016-01-04 MED ORDER — FAMOTIDINE 20 MG PO TABS: 20.0000 mg | ORAL_TABLET | Freq: Two times a day (BID) | ORAL | 1 refills | Status: DC

## 2016-01-04 MED ORDER — ONDANSETRON HCL 4 MG/2ML IJ SOLN
4.0000 mg | Freq: Once | INTRAMUSCULAR | Status: AC
  Administered 2016-01-04: 4 mg via INTRAVENOUS
  Filled 2016-01-04: qty 2

## 2016-01-04 NOTE — ED Notes (Signed)
MD at bedside. 

## 2016-01-04 NOTE — ED Triage Notes (Signed)
States abd pain and vomiting for the past few days, states shooting pains in her back and chills, pt also states "I feel like I cant stay awake"

## 2016-01-04 NOTE — ED Notes (Signed)
Discharge instructions reviewed with patient. Questions fielded by this RN. Patient verbalizes understanding of instructions. Patient discharged home in stable condition per Williams MD . No acute distress noted at time of discharge.   

## 2016-01-04 NOTE — ED Provider Notes (Signed)
[facility] Emergency Department Provider Note        Time seen: ----------------------------------------- 7:19 PM on 01/04/2016 -----------------------------------------    I have reviewed the triage vital signs and the nursing notes.   HISTORY  Chief Complaint Abdominal Pain; Emesis; and Chills    HPI Virginia Peterson is a 40 y.o. female who presents to ER for abdominal pain and vomiting for last few days. Patient states she has shooting pain in her back and chills. She also reports feels like she can't stay awake. She states her blood sugar has been under good control until today. She denies fever, denies chest pain, has right upper quadrant abdominal pain. Patient states is worse after she eats.   Past Medical History:  Diagnosis Date  . Anxiety   . Asthma   . Depressed   . Diabetes mellitus without complication (HCC)   . Hypertension     Patient Active Problem List   Diagnosis Date Noted  . Dysthymia 10/06/2015  . Adjustment disorder with mixed anxiety and depressed mood 10/06/2015    Past Surgical History:  Procedure Laterality Date  . NECK SURGERY      Allergies Review of patient's allergies indicates no known allergies.  Social History Social History  Substance Use Topics  . Smoking status: Never Smoker  . Smokeless tobacco: Not on file  . Alcohol use Yes    Review of Systems Constitutional: Negative for fever. Cardiovascular: Negative for chest pain. Respiratory: Negative for shortness of breath. Gastrointestinal: Positive for abdominal pain, vomiting Genitourinary: Negative for dysuria. Musculoskeletal: Positive for back pain Skin: Negative for rash. Neurological: Negative for headaches, focal weakness or numbness.  10-point ROS otherwise negative.  ____________________________________________   PHYSICAL EXAM:  VITAL SIGNS: ED Triage Vitals  Enc Vitals Group     BP 01/04/16 1841 (!) 174/88     Pulse Rate  01/04/16 1841 81     Resp 01/04/16 1841 18     Temp 01/04/16 1841 97.9 F (36.6 C)     Temp Source 01/04/16 1841 Oral     SpO2 01/04/16 1841 99 %     Weight 01/04/16 1841 220 lb (99.8 kg)     Height 01/04/16 1841  (1.549 m)     Head Circumference --      Peak Flow --      Pain Score 01/04/16 1842 8     Pain Loc --      Pain Edu? --      Excl. in GC? --     Constitutional: Alert and oriented. Well appearing and in no distress.Obese Eyes: Conjunctivae are normal. PERRL. Normal extraocular movements. ENT   Head: Normocephalic and atraumatic.   Nose: No congestion/rhinnorhea.   Mouth/Throat: Mucous membranes are moist.   Neck: No stridor. Cardiovascular: Normal rate, regular rhythm. No murmurs, rubs, or gallops. Respiratory: Normal respiratory effort without tachypnea nor retractions. Breath sounds are clear and equal bilaterally. No wheezes/rales/rhonchi. Gastrointestinal: Right upper quadrant tenderness, no rebound or guarding. Normal bowel sounds. Musculoskeletal: Nontender with normal range of motion in all extremities. No lower extremity tenderness nor edema. Neurologic:  Normal speech and language. No gross focal neurologic deficits are appreciated.  Skin:  Skin is warm, dry and intact. No rash noted. Psychiatric: Mood and affect are normal. Speech and behavior are normal.  ____________________________________________  ED COURSE:  Pertinent labs & imaging results that were available during my care of the patient were reviewed by me and considered in my medical decision  making (see chart for details). Clinical Course  Patient is in no acute distress, will check basic labs and likely ultrasound of the right upper quadrant.  Procedures ____________________________________________   LABS (pertinent positives/negatives)  Labs Reviewed  COMPREHENSIVE METABOLIC PANEL - Abnormal; Notable for the following:       Result Value   Sodium 132 (*)    Chloride 99  (*)    Glucose, Bld 319 (*)    Calcium 8.6 (*)    Albumin 3.3 (*)    All other components within normal limits  URINALYSIS COMPLETEWITH MICROSCOPIC ([facility] ONLY) - Abnormal; Notable for the following:    Color, Urine STRAW (*)    APPearance CLEAR (*)    Glucose, UA >500 (*)    Ketones, ur TRACE (*)    Bacteria, UA RARE (*)    Squamous Epithelial / LPF 0-5 (*)    All other components within normal limits  GLUCOSE, CAPILLARY - Abnormal; Notable for the following:    Glucose-Capillary 311 (*)    All other components within normal limits  LIPASE, BLOOD  CBC    RADIOLOGY  Right upper quadrant ultrasound IMPRESSION: Fatty infiltration of the liver. No cholelithiasis is noted. However, gallbladder wall thickening with possible fluid within the wall is noted concerning for cholecystitis. HIDA scan is recommended for further evaluation. ____________________________________________  FINAL ASSESSMENT AND PLAN  Abdominal pain, hyperglycemia  Plan: Patient with labs and imaging as dictated above. I have discussed with Dr.Pabon who will see the patient in the office. I will place her on pain medicine with antiemetics. I will advise a low-fat diet. She is stable for outpatient follow-up at this time.   Emily Filbert, MD   Note: This dictation was prepared with Dragon dictation. Any transcriptional errors that result from this process are unintentional    Emily Filbert, MD 01/04/16 2016

## 2016-01-05 ENCOUNTER — Telehealth: Payer: Self-pay

## 2016-01-05 NOTE — Telephone Encounter (Signed)
Patient was seen in the ED 01/04/2016 by Dr. Everlene Farrier. Ultrasounds came back with gallbladder wall thickening with possible fluid. The hospital note stated to follow up with Dr. Everlene Farrier. Patient just moved to [place], she is not able to work, does not have insurance and can not afford to pay the $50 to be seen. Ginger spoke with Angie and it was concluded that the patient needs to pay something on that day to be seen. I spoke with the patient and she will see if she can borrow some money from her mother until her husband gets paid (he has a part time job). I also advised the patient to fill out a patient assistance form and she can either fill that ouit online, or pick a copy up from one of locations. I set her appointment with Dr. Everlene Farrier on 01/12/2016 at 1:45 but made it clear to the patient that she needs to show up at 1:30 to be seen. Patient understood everything.

## 2016-01-08 ENCOUNTER — Encounter: Payer: Self-pay | Admitting: Emergency Medicine

## 2016-01-08 ENCOUNTER — Emergency Department
Admission: EM | Admit: 2016-01-08 | Discharge: 2016-01-08 | Disposition: A | Discharge status: Home / Self Care | Payer: Self-pay | Attending: Emergency Medicine | Admitting: Emergency Medicine

## 2016-01-08 ENCOUNTER — Emergency Department: Payer: Self-pay

## 2016-01-08 DIAGNOSIS — E119 Type 2 diabetes mellitus without complications: Secondary | ICD-10-CM | POA: Insufficient documentation

## 2016-01-08 DIAGNOSIS — I10 Essential (primary) hypertension: Secondary | ICD-10-CM | POA: Insufficient documentation

## 2016-01-08 DIAGNOSIS — Z791 Long term (current) use of non-steroidal anti-inflammatories (NSAID): Secondary | ICD-10-CM | POA: Insufficient documentation

## 2016-01-08 DIAGNOSIS — Z79899 Other long term (current) drug therapy: Secondary | ICD-10-CM | POA: Insufficient documentation

## 2016-01-08 DIAGNOSIS — Z7984 Long term (current) use of oral hypoglycemic drugs: Secondary | ICD-10-CM | POA: Insufficient documentation

## 2016-01-08 DIAGNOSIS — R1013 Epigastric pain: Secondary | ICD-10-CM | POA: Insufficient documentation

## 2016-01-08 DIAGNOSIS — J45909 Unspecified asthma, uncomplicated: Secondary | ICD-10-CM | POA: Insufficient documentation

## 2016-01-08 LAB — COMPREHENSIVE METABOLIC PANEL
ALK PHOS: 56 U/L (ref 38–126)
ALT: 28 U/L (ref 14–54)
AST: 37 U/L (ref 15–41)
Albumin: 3.7 g/dL (ref 3.5–5.0)
Anion gap: 11 (ref 5–15)
BILIRUBIN TOTAL: 0.4 mg/dL (ref 0.3–1.2)
BUN: 10 mg/dL (ref 6–20)
CALCIUM: 8.9 mg/dL (ref 8.9–10.3)
CO2: 22 mmol/L (ref 22–32)
CREATININE: 0.55 mg/dL (ref 0.44–1.00)
Chloride: 98 mmol/L — ABNORMAL LOW (ref 101–111)
GFR calc Af Amer: >60 mL/min (ref >60)
GLUCOSE: 307 mg/dL — AB (ref 65–99)
POTASSIUM: 4 mmol/L (ref 3.5–5.1)
Sodium: 131 mmol/L — ABNORMAL LOW (ref 135–145)
TOTAL PROTEIN: 7.3 g/dL (ref 6.5–8.1)

## 2016-01-08 LAB — CBC WITH DIFFERENTIAL/PLATELET
BASOS PCT: 1 %
Basophils Absolute: 0 10*3/uL (ref 0–0.1)
EOS PCT: 2 %
Eosinophils Absolute: 0.1 10*3/uL (ref 0–0.7)
HEMATOCRIT: 38.4 % (ref 35.0–47.0)
Hemoglobin: 13.4 g/dL (ref 12.0–16.0)
Lymphocytes Relative: 28 %
Lymphs Abs: 2 10*3/uL (ref 1.0–3.6)
MCH: 28.3 pg (ref 26.0–34.0)
MCHC: 34.9 g/dL (ref 32.0–36.0)
MCV: 81.2 fL (ref 80.0–100.0)
MONO ABS: 0.4 10*3/uL (ref 0.2–0.9)
Monocytes Relative: 5 %
NEUTROS ABS: 4.5 10*3/uL (ref 1.4–6.5)
Neutrophils Relative %: 64 %
Platelets: 307 10*3/uL (ref 150–440)
RBC: 4.73 MIL/uL (ref 3.80–5.20)
RDW: 13.5 % (ref 11.5–14.5)
WBC: 6.9 10*3/uL (ref 3.6–11.0)

## 2016-01-08 LAB — LIPASE, BLOOD: LIPASE: 20 U/L (ref 11–51)

## 2016-01-08 MED ORDER — SODIUM CHLORIDE 0.9 % IV BOLUS (SEPSIS)
1000.0000 mL | Freq: Once | INTRAVENOUS | Status: AC
  Administered 2016-01-08: 1000 mL via INTRAVENOUS

## 2016-01-08 MED ORDER — DICYCLOMINE HCL 20 MG PO TABS: 20.0000 mg | ORAL_TABLET | Freq: Three times a day (TID) | ORAL | 0 refills | Status: DC | PRN

## 2016-01-08 MED ORDER — FENTANYL CITRATE (PF) 100 MCG/2ML IJ SOLN
75.0000 ug | Freq: Once | INTRAMUSCULAR | Status: AC
  Administered 2016-01-08: 75 ug via INTRAVENOUS

## 2016-01-08 MED ORDER — GI COCKTAIL ~~LOC~~
30.0000 mL | Freq: Once | ORAL | Status: AC
  Administered 2016-01-08: 30 mL via ORAL
  Filled 2016-01-08: qty 30

## 2016-01-08 MED ORDER — ONDANSETRON HCL 4 MG/2ML IJ SOLN
4.0000 mg | Freq: Once | INTRAMUSCULAR | Status: AC
  Administered 2016-01-08: 4 mg via INTRAVENOUS
  Filled 2016-01-08: qty 2

## 2016-01-08 MED ORDER — FENTANYL CITRATE (PF) 100 MCG/2ML IJ SOLN
INTRAMUSCULAR | Status: AC
  Administered 2016-01-08: 75 ug via INTRAVENOUS
  Filled 2016-01-08: qty 2

## 2016-01-08 MED ORDER — DIATRIZOATE MEGLUMINE & SODIUM 66-10 % PO SOLN
15.0000 mL | Freq: Once | ORAL | Status: AC
  Administered 2016-01-08: 15 mL via ORAL

## 2016-01-08 MED ORDER — IOPAMIDOL (ISOVUE-300) INJECTION 61%
100.0000 mL | Freq: Once | INTRAVENOUS | Status: AC | PRN
  Administered 2016-01-08: 100 mL via INTRAVENOUS

## 2016-01-08 MED ORDER — SUCRALFATE 1 G PO TABS: 1.0000 g | ORAL_TABLET | Freq: Four times a day (QID) | ORAL | 0 refills | Status: DC

## 2016-01-08 MED ORDER — MORPHINE SULFATE (PF) 4 MG/ML IV SOLN
4.0000 mg | Freq: Once | INTRAVENOUS | Status: AC
  Administered 2016-01-08: 4 mg via INTRAVENOUS
  Filled 2016-01-08: qty 1

## 2016-01-08 NOTE — ED Notes (Signed)
Patient transported to CT 

## 2016-01-08 NOTE — Discharge Instructions (Signed)
Please seek medical attention for any high fevers, chest pain, shortness of breath, change in behavior, persistent vomiting, bloody stool or any other new or concerning symptoms.  

## 2016-01-08 NOTE — ED Provider Notes (Signed)
[facility] Emergency Department Provider Note   ____________________________________________   I have reviewed the triage vital signs and the nursing notes.   HISTORY  Chief Complaint Abdominal Pain   History limited by: Not Limited   HPI Virginia Peterson is a 40 y.o. female who presents to the emergency department today because of continued right upper quadrant pain. The patient was seen in the emergency department four days ago for the same complaint. At that time she had a RUQ [place] which showed some GB wall thickening and pericholecystic fluid however no gallstones. She was given surgery referral and has an appointment next week. Since leaving she has continued to have pain and has been having episodes of vomiting. She tried taking the prescribed medicine without any relief. She feels like she has been hot but has not had any fevers.   Past Medical History:  Diagnosis Date  . Anxiety   . Asthma   . Depressed   . Diabetes mellitus without complication (HCC)   . Hypertension     Patient Active Problem List   Diagnosis Date Noted  . Dysthymia 10/06/2015  . Adjustment disorder with mixed anxiety and depressed mood 10/06/2015    Past Surgical History:  Procedure Laterality Date  . NECK SURGERY      Prior to Admission medications   Medication Sig Start Date End Date Taking? Authorizing Provider  albuterol (PROVENTIL HFA;VENTOLIN HFA) 108 (90 BASE) MCG/ACT inhaler Inhale 2 puffs into the lungs every 6 (six) hours as needed for wheezing or shortness of breath. 04/25/15   Tommi Rumps, PA-C  benzonatate (TESSALON PERLES) 100 MG capsule Take 1 capsule (100 mg total) by mouth 3 (three) times daily as needed for cough. 05/05/15 05/04/16  Darci Current, MD  Cetirizine HCl 10 MG CAPS Take 1 capsule (10 mg total) by mouth daily. 09/21/15   Chinita Pester, FNP  dicyclomine (BENTYL) 20 MG tablet Take 1 tablet (20 mg total) by mouth 3 (three) times daily as  needed for spasms. 10/21/15 10/20/16  Myrna Blazer, MD  famotidine (PEPCID) 20 MG tablet Take 1 tablet (20 mg total) by mouth 2 (two) times daily. 01/04/16   Emily Filbert, MD  glipiZIDE (GLUCOTROL) 10 MG tablet Take 1 tablet (10 mg total) by mouth 2 (two) times daily before a meal. 09/10/15 09/09/16  Myrna Blazer, MD  glipiZIDE (GLUCOTROL) 10 MG tablet Take 1 tablet (10 mg total) by mouth 2 (two) times daily before a meal. 09/10/15 09/09/16  Myrna Blazer, MD  lisinopril (PRINIVIL,ZESTRIL) 10 MG tablet Take 1 tablet (10 mg total) by mouth daily. 05/05/15   Darci Current, MD  meloxicam (MOBIC) 15 MG tablet Take 1 tablet (15 mg total) by mouth daily. 09/21/15   Chinita Pester, FNP  metFORMIN (GLUCOPHAGE) 1000 MG tablet Take 1 tablet (1,000 mg total) by mouth 2 (two) times daily with a meal. 09/10/15 09/09/16  Myrna Blazer, MD  metFORMIN (GLUCOPHAGE) 1000 MG tablet Take 1 tablet (1,000 mg total) by mouth 2 (two) times daily with a meal. 09/10/15 09/09/16  Myrna Blazer, MD  metoCLOPramide (REGLAN) 10 MG tablet Take 1 tablet (10 mg total) by mouth every 8 (eight) hours as needed for nausea or vomiting. 01/04/16   Emily Filbert, MD  naproxen (NAPROSYN) 500 MG tablet Take 1 tablet (500 mg total) by mouth 2 (two) times daily with a meal. 11/18/15   Evon Slack, PA-C  ondansetron [facility]) 4  MG tablet Take 1 tablet (4 mg total) by mouth daily as needed. 10/21/15   Myrna Blazer, MD  oxyCODONE-acetaminophen (PERCOCET) 5-325 MG tablet Take 2 tablets by mouth every 6 (six) hours as needed for moderate pain or severe pain. 01/04/16   Emily Filbert, MD  pravastatin (PRAVACHOL) 10 MG tablet Take 1 tablet (10 mg total) by mouth daily. 11/18/15   Evon Slack, PA-C  traMADol (ULTRAM) 50 MG tablet Take 1 tablet (50 mg total) by mouth every 6 (six) hours as needed. 11/18/15   Evon Slack, PA-C    Allergies Review of patient's allergies indicates no  known allergies.  No family history on file.  Social History Social History  Substance Use Topics  . Smoking status: Never Smoker  . Smokeless tobacco: Never Used  . Alcohol use Yes    Review of Systems  Constitutional: Negative for fever. Cardiovascular: Negative for chest pain. Respiratory: Negative for shortness of breath. Gastrointestinal: Positive for abdominal pain. Genitourinary: Negative for dysuria. Musculoskeletal: Negative for back pain. Skin: Negative for rash. Neurological: Negative for headaches, focal weakness or numbness.   10-point ROS otherwise negative.  ____________________________________________   PHYSICAL EXAM:  VITAL SIGNS: ED Triage Vitals  Enc Vitals Group     BP 01/08/16 1202 (!) 162/66     Pulse Rate 01/08/16 1202 82     Resp 01/08/16 1201 16     Temp 01/08/16 1201 98 F (36.7 C)     Temp Source 01/08/16 1201 Oral     SpO2 01/08/16 1202 97 %     Weight 01/08/16 1201 220 lb (99.8 kg)     Height 01/08/16 1201  (1.549 m)     Head Circumference --      Peak Flow --      Pain Score 01/08/16 1202 10   Constitutional: Alert and oriented. Well appearing and in no distress. Eyes: Conjunctivae are normal. PERRL. Normal extraocular movements. ENT   Head: Normocephalic and atraumatic.   Nose: No congestion/rhinnorhea.   Mouth/Throat: Mucous membranes are moist.   Neck: No stridor. Hematological/Lymphatic/Immunilogical: No cervical lymphadenopathy. Cardiovascular: Normal rate, regular rhythm.  No murmurs, rubs, or gallops. Respiratory: Normal respiratory effort without tachypnea nor retractions. Breath sounds are clear and equal bilaterally. No wheezes/rales/rhonchi. Gastrointestinal: Soft and tender to palpation over the epigastric and RUQ, no rebound or guarding. Genitourinary: Deferred Musculoskeletal: Normal range of motion in all extremities. No joint effusions.  No lower extremity tenderness nor edema. Neurologic:   Normal speech and language. No gross focal neurologic deficits are appreciated.  Skin:  Skin is warm, dry and intact. No rash noted. Psychiatric: Mood and affect are normal. Speech and behavior are normal. Patient exhibits appropriate insight and judgment.  ____________________________________________    LABS (pertinent positives/negatives)  Labs Reviewed  COMPREHENSIVE METABOLIC PANEL - Abnormal; Notable for the following:       Result Value   Sodium 131 (*)    Chloride 98 (*)    Glucose, Bld 307 (*)    All other components within normal limits  CBC WITH DIFFERENTIAL/PLATELET  LIPASE, BLOOD     ____________________________________________   EKG  None  ____________________________________________    RADIOLOGY  CT abd/pel IMPRESSION: 1. No acute findings. 2. Hepatic steatosis. 3. Left ovary cyst measures 4 cm. This is almost certainly benign, and no specific imaging follow up is recommended according to the Society of Radiologists in Ultrasound2010 Consensus Conference Statement (D Lavonda Jumbo al. Management of Asymptomatic Ovarian and  Other Adnexal Cysts Imaged at US: Society of Radiologists in Ultrasound Consensus Conference Statement 2010. Radiology 256 (Sept 2010): [phone].).  ____________________________________________   PROCEDURES  Procedures  ____________________________________________   INITIAL IMPRESSION / ASSESSMENT AND PLAN / ED COURSE  Pertinent labs & imaging results that were available during my care of the patient were reviewed by me and considered in my medical decision making (see chart for details).  Patient presented to the emergency department today because of concerns for continued abdominal pain. Patient was seen in the emergency department 4 days ago and diagnosed with possible gallbladder issue and was discharged to follow-up with surgery. Today's CT scan without any concerning findings. Discussed with patient that it could be due to  gastritis since she did get some relief with GI cocktail. Will discharge home on sucralfate and Bentyl. Patient was prescribed antiacid at previous visit. Will give GI follow up. ____________________________________________   FINAL CLINICAL IMPRESSION(S) / ED DIAGNOSES  Final diagnoses:  Epigastric pain     Note: This dictation was prepared with Dragon dictation. Any transcriptional errors that result from this process are unintentional    Phineas SemenGraydon Lunabelle Oatley, MD 01/08/16 1458

## 2016-01-08 NOTE — ED Triage Notes (Signed)
Seen in ED on Thursday and diagnosed with cholecystitis.  Has appointment with Dr. Everlene FarrierPabon on Thursday, but patient states abdominal pain persists and is not relieved and is unable to tolerate PO food.

## 2016-01-08 NOTE — ED Notes (Signed)
AAOx3.  Skin warm and dry.  NAD.  Posture relaxed and upright.

## 2016-01-11 ENCOUNTER — Ambulatory Visit: Payer: Self-pay

## 2016-01-12 ENCOUNTER — Ambulatory Visit (INDEPENDENT_AMBULATORY_CARE_PROVIDER_SITE_OTHER): Payer: Self-pay | Admitting: Surgery

## 2016-01-12 ENCOUNTER — Encounter: Payer: Self-pay | Admitting: Surgery

## 2016-01-12 VITALS — BP 147/81 | HR 85 | Temp 98.4°F | Ht 62.0 in | Wt 251.0 lb

## 2016-01-12 DIAGNOSIS — R1010 Upper abdominal pain, unspecified: Secondary | ICD-10-CM

## 2016-01-12 DIAGNOSIS — R1013 Epigastric pain: Secondary | ICD-10-CM

## 2016-01-12 MED ORDER — GABAPENTIN 300 MG PO CAPS: 300.0000 mg | ORAL_CAPSULE | Freq: Three times a day (TID) | ORAL | 3 refills | Status: DC

## 2016-01-12 NOTE — Telephone Encounter (Signed)
:  LVM for patient to call office at this time.  Hida scan-01/17/16@9 :[facility]  Discuss prep- NPO after midnight, No narcotics within 24 hrs of having scan.  Gabepentin 300 mg called to pharmacy Medication management  Dr.Pabon F/U on 02/01/16 @ 9:30 [place] office.

## 2016-01-12 NOTE — Addendum Note (Signed)
Addended by: Cameron ProudHILDERS, Marilou Barnfield S on: 01/12/2016 04:04 PM   Modules accepted: Orders

## 2016-01-12 NOTE — Addendum Note (Signed)
Addended by: Cameron ProudHILDERS, Tommy Goostree S on: 01/12/2016 04:33 PM   Modules accepted: Orders

## 2016-01-12 NOTE — Progress Notes (Signed)
Patient ID: Virginia Peterson, female   DOB: [birth year], 40 y.o.   MRN: [id number]  HPI Virginia Peterson is a 40 y.o. female with a significant history of general anxiety disorder, depression diabetes and morbid obesity. Seen for abdominal pain. She reports that she has been having intermittent crampy abdominal pain in the right upper quadrant and upper abdomen for the last 3 weeks. There is no specific out of eating or aggravating factors. She does report feeling nauseated and having multiple episodes of emesis. No fevers no chills no evidence of obstructive jaundice. No family history of gallbladder disease. She does have some occasional diarrhea and constipation. She has been seen twice in the emergency room for this and workup included ultrasound and CT scan of the abdomen that I have personally reviewed there is no evidence of gallstones there was normal common bile duct there was no free air there was no bowel obstruction there was no hernias. Only abdominal surgery is a C-section more than 20 years ago  HPI  Past Medical History:  Diagnosis Date  . Anxiety   . Asthma   . Depressed   . Diabetes mellitus without complication (HCC)   . Dysthymia 2016  . Hypertension     Past Surgical History:  Procedure Laterality Date  . benign neck tumor  2012  . CESAREAN SECTION    . NECK SURGERY      Family History  Problem Relation Age of Onset  . Diabetes Mother   . Diabetes Father   . Diabetes Maternal Grandfather   . Diabetes Paternal Grandmother     Social History Social History  Substance Use Topics  . Smoking status: Never Smoker  . Smokeless tobacco: Never Used  . Alcohol use Yes    No Known Allergies  Current Outpatient Prescriptions  Medication Sig Dispense Refill  . albuterol (PROVENTIL HFA;VENTOLIN HFA) 108 (90 BASE) MCG/ACT inhaler Inhale 2 puffs into the lungs every 6 (six) hours as needed for wheezing or shortness of breath. 1 Inhaler 2  . ALPRAZolam (XANAX) 1 MG tablet Take  1 mg by mouth.    . benzonatate (TESSALON PERLES) 100 MG capsule Take 1 capsule (100 mg total) by mouth 3 (three) times daily as needed for cough. 30 capsule 0  . Cetirizine HCl 10 MG CAPS Take 1 capsule (10 mg total) by mouth daily. 30 capsule 0  . dicyclomine (BENTYL) 20 MG tablet Take 1 tablet (20 mg total) by mouth 3 (three) times daily as needed for spasms. 15 tablet 0  . dicyclomine (BENTYL) 20 MG tablet Take 1 tablet (20 mg total) by mouth 3 (three) times daily as needed for spasms. 30 tablet 0  . famotidine (PEPCID) 20 MG tablet Take 1 tablet (20 mg total) by mouth 2 (two) times daily. 60 tablet 1  . glipiZIDE (GLUCOTROL) 10 MG tablet Take 1 tablet (10 mg total) by mouth 2 (two) times daily before a meal. 60 tablet 0  . glipiZIDE (GLUCOTROL) 10 MG tablet Take 1 tablet (10 mg total) by mouth 2 (two) times daily before a meal. 60 tablet 0  . lisinopril (PRINIVIL,ZESTRIL) 10 MG tablet Take 1 tablet (10 mg total) by mouth daily. 90 tablet 0  . meloxicam (MOBIC) 15 MG tablet Take 1 tablet (15 mg total) by mouth daily. 30 tablet 0  . metFORMIN (GLUCOPHAGE) 1000 MG tablet Take 1 tablet (1,000 mg total) by mouth 2 (two) times daily with a meal. 60 tablet 0  . metFORMIN (GLUCOPHAGE) 1000 MG  tablet Take 1 tablet (1,000 mg total) by mouth 2 (two) times daily with a meal. 60 tablet 0  . metoCLOPramide (REGLAN) 10 MG tablet Take 1 tablet (10 mg total) by mouth every 8 (eight) hours as needed for nausea or vomiting. 30 tablet 1  . naproxen (NAPROSYN) 500 MG tablet Take 1 tablet (500 mg total) by mouth 2 (two) times daily with a meal. 30 tablet 0  . ondansetron (ZOFRAN) 4 MG tablet Take 1 tablet (4 mg total) by mouth daily as needed. 10 tablet 0  . oxyCODONE-acetaminophen (PERCOCET) 5-325 MG tablet Take 2 tablets by mouth every 6 (six) hours as needed for moderate pain or severe pain. 20 tablet 0  . pravastatin (PRAVACHOL) 10 MG tablet Take 1 tablet (10 mg total) by mouth daily. 30 tablet 0  . sucralfate  (CARAFATE) 1 g tablet Take 1 tablet (1 g total) by mouth 4 (four) times daily. 60 tablet 0  . traMADol (ULTRAM) 50 MG tablet Take 1 tablet (50 mg total) by mouth every 6 (six) hours as needed. 20 tablet 0   No current facility-administered medications for this visit.      Review of Systems A 10 point review of systems was asked and was negative except for the information on the HPI  Physical Exam Blood pressure (!) 147/81, pulse 85, temperature 98.4 F (36.9 C), temperature source Oral, height 5\' 2"  (1.575 m), weight 113.9 kg (251 lb), last menstrual period 12/13/2015. CONSTITUTIONAL: NAD morbidly obese female EYES: Pupils are equal, round, and reactive to light, Sclera are non-icteric. EARS, NOSE, MOUTH AND THROAT: The oropharynx is clear. The oral mucosa is pink and moist. Hearing is intact to voice. LYMPH NODES:  Lymph nodes in the neck are normal. RESPIRATORY:  Lungs are clear. There is normal respiratory effort, with equal breath sounds bilaterally, and without pathologic use of accessory muscles. CARDIOVASCULAR: Heart is regular without murmurs, gallops, or rubs. On her right chest wall there is exquisite tenderness to palpation GI: The abdomen is  soft, Mild Tenderness to palpation of RUQ, no murphy, no peritonitis. There is no hepatosplenomegaly. There are normal bowel sounds in all quadrants. GU: Rectal deferred.   MUSCULOSKELETAL: Normal muscle strength and tone. No cyanosis or edema.   SKIN: Turgor is good and there are no pathologic skin lesions or ulcers. NEUROLOGIC: Motor and sensation is grossly normal. Cranial nerves are grossly intact. PSYCH:  Oriented to person, place and time. Affect is normal.  Data Reviewed  I have personally reviewed the patient's imaging, laboratory findings and medical records.    Assessment Plan Chest wall pain and upper abdominal pain. 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 Differential diagnosis will include biliary  dyskinesia, gastroparesis given that she is diabetic and IBS. At a lengthy discussion with the patient and her husband about her disease process. Before attempting any surgical intervention I will like to make sure that we will actually help her if we perform a cholecystectomy. I more now order a HIDA scan and also will place her on gabapentin for the possible intercostal neuralgia. There is no need for immediate surgical intervention at this time. I also explained to her that we may have to send her for further diagnostic test by GI that may include gastric emptying his thigh. Note that I have personally reviewed all the laboratory data as well as imaging studies. Extensive counseling was provided  Sterling Bigiego Pabon, MD FACS General Surgeon 01/12/2016, 2:27 PM

## 2016-01-13 NOTE — Telephone Encounter (Signed)
LVM at this time for patient to call office. Appointment reminders are mailed to patient at this time.

## 2016-01-13 NOTE — Telephone Encounter (Signed)
Spoke with patient at this time to provide appointment dates and times of HIDA scan and F/U with Dr.Pabon.  She said someone called and moved her HIDA scan appointment  to 01-27-16 @ 9:45.  I then changed her F/U appointment with Dr.Pabon to 02-13-16 to reflect the 2 week reading of images. Also let patient know her medicine was sent to pharmacy yesterday. Patient verbalized understanding.

## 2016-01-17 DIAGNOSIS — E119 Type 2 diabetes mellitus without complications: Secondary | ICD-10-CM | POA: Insufficient documentation

## 2016-01-17 DIAGNOSIS — R1013 Epigastric pain: Secondary | ICD-10-CM | POA: Insufficient documentation

## 2016-01-17 DIAGNOSIS — I10 Essential (primary) hypertension: Secondary | ICD-10-CM | POA: Insufficient documentation

## 2016-01-17 DIAGNOSIS — Z7984 Long term (current) use of oral hypoglycemic drugs: Secondary | ICD-10-CM | POA: Insufficient documentation

## 2016-01-17 DIAGNOSIS — J45909 Unspecified asthma, uncomplicated: Secondary | ICD-10-CM | POA: Insufficient documentation

## 2016-01-17 DIAGNOSIS — Z79899 Other long term (current) drug therapy: Secondary | ICD-10-CM | POA: Insufficient documentation

## 2016-01-17 LAB — POCT PREGNANCY, URINE: PREG TEST UR: NEGATIVE

## 2016-01-17 NOTE — ED Triage Notes (Addendum)
Patient ambulatory to triage with steady gait, without difficulty or distress noted; pt reports seen here recently for right sided abd pain; st f/u with surgeon and made appt with GI specialist but c/o persistent N/V/D

## 2016-01-18 ENCOUNTER — Emergency Department
Admission: EM | Admit: 2016-01-18 | Discharge: 2016-01-18 | Disposition: A | Discharge status: Home / Self Care | Payer: Self-pay | Attending: Emergency Medicine | Admitting: Emergency Medicine

## 2016-01-18 DIAGNOSIS — R1013 Epigastric pain: Secondary | ICD-10-CM

## 2016-01-18 LAB — URINALYSIS COMPLETE WITH MICROSCOPIC (ARMC ONLY)
BACTERIA UA: NONE SEEN
BILIRUBIN URINE: NEGATIVE
Hgb urine dipstick: NEGATIVE
Leukocytes, UA: NEGATIVE
Nitrite: NEGATIVE
Protein, ur: 100 mg/dL — AB
Specific Gravity, Urine: 1.029 (ref 1.005–1.030)
pH: 6 (ref 5.0–8.0)

## 2016-01-18 LAB — COMPREHENSIVE METABOLIC PANEL
ALBUMIN: 4 g/dL (ref 3.5–5.0)
ALK PHOS: 54 U/L (ref 38–126)
ALT: 25 U/L (ref 14–54)
ANION GAP: 10 (ref 5–15)
AST: 26 U/L (ref 15–41)
BUN: 12 mg/dL (ref 6–20)
CALCIUM: 9 mg/dL (ref 8.9–10.3)
CO2: 27 mmol/L (ref 22–32)
Chloride: 98 mmol/L — ABNORMAL LOW (ref 101–111)
Creatinine, Ser: 0.6 mg/dL (ref 0.44–1.00)
GFR calc Af Amer: >60 mL/min (ref >60)
GFR calc non Af Amer: >60 mL/min (ref >60)
GLUCOSE: 230 mg/dL — AB (ref 65–99)
Potassium: 3.9 mmol/L (ref 3.5–5.1)
SODIUM: 135 mmol/L (ref 135–145)
Total Bilirubin: 0.3 mg/dL (ref 0.3–1.2)
Total Protein: 7.5 g/dL (ref 6.5–8.1)

## 2016-01-18 LAB — CBC
HEMATOCRIT: 38.2 % (ref 35.0–47.0)
HEMOGLOBIN: 13 g/dL (ref 12.0–16.0)
MCH: 27.7 pg (ref 26.0–34.0)
MCHC: 34 g/dL (ref 32.0–36.0)
MCV: 81.4 fL (ref 80.0–100.0)
Platelets: 373 10*3/uL (ref 150–440)
RBC: 4.69 MIL/uL (ref 3.80–5.20)
RDW: 13.7 % (ref 11.5–14.5)
WBC: 8.5 10*3/uL (ref 3.6–11.0)

## 2016-01-18 LAB — LIPASE, BLOOD: Lipase: 39 U/L (ref 11–51)

## 2016-01-18 MED ORDER — DICYCLOMINE HCL 20 MG PO TABS: 20.0000 mg | ORAL_TABLET | Freq: Three times a day (TID) | ORAL | 0 refills | Status: DC | PRN

## 2016-01-18 NOTE — ED Notes (Signed)
Discharge instructions reviewed with patient. Questions fielded by this RN. Patient verbalizes understanding of instructions. Patient discharged home in stable condition per Goodman MD . No acute distress noted at time of discharge.   

## 2016-01-18 NOTE — ED Notes (Signed)
Dr. Goodman at bedside.  

## 2016-01-18 NOTE — Discharge Instructions (Signed)
Please seek medical attention for any high fevers, chest pain, shortness of breath, change in behavior, persistent vomiting, bloody stool or any other new or concerning symptoms.  

## 2016-01-18 NOTE — ED Provider Notes (Signed)
[facility] Emergency Department Provider Note    ____________________________________________   I have reviewed the triage vital signs and the nursing notes.   HISTORY  Chief Complaint Abdominal Pain   History limited by: Not Limited   HPI Virginia Peterson is a [age] y.o. female who presents to the emergency department today with continued abdominal pain. It is located in the epigastric region. It is worse after eating certain foods. She describes it as sharp and then throbbing. This is now her third emergency department visit for these symptoms. I did personally evaluate her at her last emergency department visit. Since that visit she has seen surgery who has referred her to GI. She has an appointment with GI set up later in the month. She denies any new symptoms or fevers.   Past Medical History:  Diagnosis Date  . Anxiety   . Asthma   . Depressed   . Diabetes mellitus without complication (HCC)   . Dysthymia 2016  . Hypertension     Patient Active Problem List   Diagnosis Date Noted  . Dysthymia 10/06/2015  . Adjustment disorder with mixed anxiety and depressed mood 10/06/2015    Past Surgical History:  Procedure Laterality Date  . benign neck tumor  2012  . CESAREAN SECTION    . NECK SURGERY      Prior to Admission medications   Medication Sig Start Date End Date Taking? Authorizing Provider  albuterol (PROVENTIL HFA;VENTOLIN HFA) 108 (90 BASE) MCG/ACT inhaler Inhale 2 puffs into the lungs every 6 (six) hours as needed for wheezing or shortness of breath. 04/25/15   Tommi Rumpshonda L Summers, PA-C  ALPRAZolam Prudy Feeler(XANAX) 1 MG tablet Take 1 mg by mouth.    Historical Provider, MD  benzonatate (TESSALON PERLES) 100 MG capsule Take 1 capsule (100 mg total) by mouth 3 (three) times daily as needed for cough. Patient not taking: Reported on 01/18/2016 05/05/15 05/04/16  Darci Currentandolph N Brown, MD  Cetirizine HCl 10 MG CAPS Take 1 capsule (10 mg total) by mouth daily.  09/21/15   Chinita Pesterari B Triplett, FNP  dicyclomine (BENTYL) 20 MG tablet Take 1 tablet (20 mg total) by mouth 3 (three) times daily as needed for spasms. 10/21/15 10/20/16  Myrna Blazeravid Matthew Schaevitz, MD  dicyclomine (BENTYL) 20 MG tablet Take 1 tablet (20 mg total) by mouth 3 (three) times daily as needed for spasms. 01/08/16 01/07/17  Phineas SemenGraydon Undra Harriman, MD  famotidine (PEPCID) 20 MG tablet Take 1 tablet (20 mg total) by mouth 2 (two) times daily. 01/04/16   Emily FilbertJonathan E Williams, MD  gabapentin (NEURONTIN) 300 MG capsule Take 1 capsule (300 mg total) by mouth 3 (three) times daily. 01/12/16   Diego F Pabon, MD  glipiZIDE (GLUCOTROL) 10 MG tablet Take 1 tablet (10 mg total) by mouth 2 (two) times daily before a meal. 09/10/15 09/09/16  Myrna Blazeravid Matthew Schaevitz, MD  lisinopril (PRINIVIL,ZESTRIL) 10 MG tablet Take 1 tablet (10 mg total) by mouth daily. 05/05/15   Darci Currentandolph N Brown, MD  meloxicam (MOBIC) 15 MG tablet Take 1 tablet (15 mg total) by mouth daily. 09/21/15   Chinita Pesterari B Triplett, FNP  metFORMIN (GLUCOPHAGE) 1000 MG tablet Take 1 tablet (1,000 mg total) by mouth 2 (two) times daily with a meal. 09/10/15 09/09/16  Myrna Blazeravid Matthew Schaevitz, MD  metFORMIN (GLUCOPHAGE) 1000 MG tablet Take 1 tablet (1,000 mg total) by mouth 2 (two) times daily with a meal. 09/10/15 09/09/16  Myrna Blazeravid Matthew Schaevitz, MD  metoCLOPramide (REGLAN) 10 MG tablet Take  1 tablet (10 mg total) by mouth every 8 (eight) hours as needed for nausea or vomiting. 01/04/16   Emily Filbert, MD  naproxen (NAPROSYN) 500 MG tablet Take 1 tablet (500 mg total) by mouth 2 (two) times daily with a meal. 11/18/15   Evon Slack, PA-C  ondansetron (ZOFRAN) 4 MG tablet Take 1 tablet (4 mg total) by mouth daily as needed. 10/21/15   Myrna Blazer, MD  oxyCODONE-acetaminophen (PERCOCET) 5-325 MG tablet Take 2 tablets by mouth every 6 (six) hours as needed for moderate pain or severe pain. 01/04/16   Emily Filbert, MD  pravastatin (PRAVACHOL) 10 MG tablet Take 1 tablet  (10 mg total) by mouth daily. 11/18/15   Evon Slack, PA-C  sucralfate (CARAFATE) 1 g tablet Take 1 tablet (1 g total) by mouth 4 (four) times daily. 01/08/16   Phineas Semen, MD  traMADol (ULTRAM) 50 MG tablet Take 1 tablet (50 mg total) by mouth every 6 (six) hours as needed. 11/18/15   Evon Slack, PA-C    Allergies Review of patient's allergies indicates no known allergies.  Family History  Problem Relation Age of Onset  . Diabetes Mother   . Diabetes Father   . Diabetes Maternal Grandfather   . Diabetes Paternal Grandmother     Social History Social History  Substance Use Topics  . Smoking status: Never Smoker  . Smokeless tobacco: Never Used  . Alcohol use Yes    Review of Systems  Constitutional: Negative for fever. Cardiovascular: Negative for chest pain. Respiratory: Negative for shortness of breath. Gastrointestinal: Positive for epigastric abdominal pain Neurological: Negative for headaches, focal weakness or numbness.   10-point ROS otherwise negative.  ____________________________________________   PHYSICAL EXAM:  VITAL SIGNS: ED Triage Vitals  Enc Vitals Group     BP 01/17/16 2133 139/80     Pulse Rate 01/17/16 2133 93     Resp 01/17/16 2133 20     Temp 01/17/16 2133 98.4 F (36.9 C)     Temp Source 01/17/16 2133 Oral     SpO2 01/17/16 2133 97 %     Weight 01/17/16 2133 249 lb 7 oz (113.1 kg)     Height 01/17/16 2133 5\' 1"  (1.549 m)     Head Circumference --      Peak Flow --      Pain Score 01/17/16 2158 10   Constitutional: Alert and oriented. Well appearing and in no distress. Eyes: Conjunctivae are normal. PERRL. Normal extraocular movements. ENT   Head: Normocephalic and atraumatic.   Nose: No congestion/rhinnorhea.   Mouth/Throat: Mucous membranes are moist.   Neck: No stridor. Hematological/Lymphatic/Immunilogical: No cervical lymphadenopathy. Cardiovascular: Normal rate, regular rhythm.  No murmurs, rubs, or  gallops. Respiratory: Normal respiratory effort without tachypnea nor retractions. Breath sounds are clear and equal bilaterally. No wheezes/rales/rhonchi. Gastrointestinal: Soft and nontender. No distention.  Genitourinary: Deferred Musculoskeletal: Normal range of motion in all extremities. No joint effusions.  No lower extremity tenderness nor edema. Neurologic:  Normal speech and language. No gross focal neurologic deficits are appreciated.  Skin:  Skin is warm, dry and intact. No rash noted. Psychiatric: Mood and affect are normal. Speech and behavior are normal. Patient exhibits appropriate insight and judgment.  ____________________________________________    LABS (pertinent positives/negatives)  Labs Reviewed  COMPREHENSIVE METABOLIC PANEL - Abnormal; Notable for the following:       Result Value   Chloride 98 (*)    Glucose, Bld 230 (*)  All other components within normal limits  URINALYSIS COMPLETEWITH MICROSCOPIC ([facility] ONLY) - Abnormal; Notable for the following:    Color, Urine YELLOW (*)    APPearance CLEAR (*)    Glucose, UA >500 (*)    Ketones, ur TRACE (*)    Protein, ur 100 (*)    Squamous Epithelial / LPF 0-5 (*)    All other components within normal limits  LIPASE, BLOOD  CBC  POCT PREGNANCY, URINE     ____________________________________________   EKG  None  ____________________________________________    RADIOLOGY  None  ____________________________________________   PROCEDURES  Procedures  ____________________________________________   INITIAL IMPRESSION / ASSESSMENT AND PLAN / ED COURSE  Pertinent labs & imaging results that were available during my care of the patient were reviewed by me and considered in my medical decision making (see chart for details).  Patient presented to the emergency department today with continued epigastric pain. Blood work today without any concerning findings. She has recently undergone a CT scan as  well as right upper quadrant ultrasound. Additionally she has seen surgery. This point discussed with patient that she should continue plan to follow up with GI.   ____________________________________________   FINAL CLINICAL IMPRESSION(S) / ED DIAGNOSES  Final diagnoses:  Epigastric pain     Note: This dictation was prepared with Dragon dictation. Any transcriptional errors that result from this process are unintentional    Phineas SemenGraydon Lynsey Ange, MD 01/18/16 [phone]

## 2016-01-27 ENCOUNTER — Encounter
Admission: RE | Admit: 2016-01-27 | Discharge: 2016-01-27 | Disposition: A | Discharge status: Home / Self Care | Payer: Self-pay | Source: Ambulatory Visit | Attending: Surgery | Admitting: Surgery

## 2016-01-27 DIAGNOSIS — R1013 Epigastric pain: Secondary | ICD-10-CM | POA: Insufficient documentation

## 2016-01-27 DIAGNOSIS — R1010 Upper abdominal pain, unspecified: Secondary | ICD-10-CM | POA: Insufficient documentation

## 2016-01-27 MED ORDER — TECHNETIUM TC 99M MEBROFENIN IV KIT
5.4300 | PACK | Freq: Once | INTRAVENOUS | Status: AC | PRN
  Administered 2016-01-27: 5.43 via INTRAVENOUS

## 2016-01-30 ENCOUNTER — Telehealth: Payer: Self-pay

## 2016-01-30 NOTE — Telephone Encounter (Signed)
Called patient at this time to review results. Explained results to patient and she did complain of some pain that feels like "same pain that took me to the Emergency room just more dull." I explained that we encourage her to keep her appointment scheduled.   Patient wanted to speak about finances of being seen in office and for surgery. She has applied for Medicaid and has been denied. [facility] the [facility] so that she may call and apply.   Will keep same appointment on 02/13/16 for patient to get money for $50 Self Pay and will call [facility] is [facility] to this date.

## 2016-02-01 ENCOUNTER — Ambulatory Visit: Payer: Self-pay | Admitting: Surgery

## 2016-02-01 ENCOUNTER — Telehealth: Payer: Self-pay

## 2016-02-01 NOTE — Telephone Encounter (Signed)
Spoke with patient at this time and provided appointment date and time. Was going to let her know the Hida scan results, however Amber had called her the day before.   Patient had questions regarding doctor bill and was once again referred her to the Hospital to [facility] to Ambers previous notes. Patient verbalized understanding.

## 2016-02-01 NOTE — Telephone Encounter (Signed)
LVM for patient at this time to make her aware of her 02/03/16 appointment with Dr.Pabon @ 3 pm.  I accidentally typed 02/13/16 and need to clear up any confusion.

## 2016-02-02 ENCOUNTER — Ambulatory Visit: Payer: Self-pay | Admitting: Surgery

## 2016-02-03 ENCOUNTER — Encounter: Payer: Self-pay | Admitting: Surgery

## 2016-02-03 ENCOUNTER — Ambulatory Visit (INDEPENDENT_AMBULATORY_CARE_PROVIDER_SITE_OTHER): Payer: Self-pay | Admitting: Surgery

## 2016-02-03 VITALS — BP 165/113 | HR 102 | Temp 99.8°F | Ht 61.0 in | Wt 246.2 lb

## 2016-02-03 DIAGNOSIS — R1011 Right upper quadrant pain: Secondary | ICD-10-CM

## 2016-02-03 NOTE — Patient Instructions (Addendum)
We will have you see Dr. Servando SnareWohl (Gastroenterologist) for your abdominal pain. Please see appointment below.

## 2016-02-03 NOTE — Progress Notes (Signed)
Outpatient Surgical Follow Up  02/03/2016  Virginia Peterson is an 40 y.o. female.   Chief Complaint  Patient presents with  . Follow-up    HIDA Scan Results (01/17/16)    HPI: On out for abdominal pain. HIDA scan ( personally reviewed) showed no evidence of cholecystitis with an ejection fraction greater than 47%. Please note that CCK was not given due to the national shortage, and this was replaced with ensure. Probably patient stated that she had some mild discomfort after having ensured but she states that any meals will cause her discomfort and indigestion. She continues to have nausea and decreased appetite as well as right upper quadrant pain Past Medical History:  Diagnosis Date  . Anxiety   . Asthma   . Depressed   . Diabetes mellitus without complication (HCC)   . Dysthymia 2016  . Hyperlipidemia   . Hypertension     Past Surgical History:  Procedure Laterality Date  . benign neck tumor  2012  . CESAREAN SECTION    . NECK SURGERY      Family History  Problem Relation Age of Onset  . Diabetes Mother   . Diabetes Father   . Diabetes Maternal Grandfather   . Diabetes Paternal Grandmother     Social History:  reports that she has never smoked. She has never used smokeless tobacco. She reports that she does not drink alcohol or use drugs.  Allergies: No Known Allergies  Medications reviewed.    ROS  For review of system performed and is otherwise negative other that what is stated in the history of present illness  BP (!) 165/113 Comment: Right  Pulse (!) 102   Temp 99.8 F (37.7 C) (Oral)   Ht 5\' 1"  (1.549 m)   Wt 111.7 kg (246 lb 3.2 oz)   LMP 01/15/2016 (Exact Date)   BMI 46.52 kg/m   Physical Exam Morbidly obese female in no acute distress awake alert Abd: soft, Mild TTP RUQ and epigastric area No peritonitis , no murphy Ext: no edema , well perfused Neuro: awake , alert, no motor or sens deficits   No results found for this or any previous visit  (from the past 48 hour(s)). No results found.  Assessment/Plan: Persistent abdominal pain and a morbidly obese and diabetic patient differential diagnosis will include gastroparesis versus peptic ulcer disease versus a component of biliary dyskinesia. HIDA scan was not consistent with biliary dyskinesia. Her ejection fraction was preserved. Discussed with the patient in detail and I think the best course of action will [facility] in the form of EGD and colonoscopy may be and gastric emptying study. We'll make sure that she will follow up with Dr.Wohl for further and GI workup. The need for any cholecystectomy at this point I will only consider close cystectomy after all the options have been contemplated and no explanation for his symptoms sound   Sterling Bigiego Suzi Hernan, MD FACS General Surgeon  02/03/2016,3:48 PM

## 2016-02-13 ENCOUNTER — Ambulatory Visit: Payer: Self-pay | Admitting: Surgery

## 2016-02-28 ENCOUNTER — Ambulatory Visit: Payer: Self-pay | Admitting: Gastroenterology

## 2016-03-01 ENCOUNTER — Encounter: Payer: Self-pay | Admitting: *Deleted

## 2016-03-01 DIAGNOSIS — M542 Cervicalgia: Secondary | ICD-10-CM | POA: Insufficient documentation

## 2016-03-01 DIAGNOSIS — Z5321 Procedure and treatment not carried out due to patient leaving prior to being seen by health care provider: Secondary | ICD-10-CM | POA: Insufficient documentation

## 2016-03-01 DIAGNOSIS — M25511 Pain in right shoulder: Secondary | ICD-10-CM | POA: Insufficient documentation

## 2016-03-01 LAB — BASIC METABOLIC PANEL
ANION GAP: 7 (ref 5–15)
BUN: 16 mg/dL (ref 6–20)
CALCIUM: 9.3 mg/dL (ref 8.9–10.3)
CO2: 28 mmol/L (ref 22–32)
Chloride: 98 mmol/L — ABNORMAL LOW (ref 101–111)
Creatinine, Ser: 0.89 mg/dL (ref 0.44–1.00)
Glucose, Bld: 340 mg/dL — ABNORMAL HIGH (ref 65–99)
POTASSIUM: 4.5 mmol/L (ref 3.5–5.1)
Sodium: 133 mmol/L — ABNORMAL LOW (ref 135–145)

## 2016-03-01 LAB — CBC
HCT: 39.7 % (ref 35.0–47.0)
HEMOGLOBIN: 13.6 g/dL (ref 12.0–16.0)
MCH: 27.8 pg (ref 26.0–34.0)
MCHC: 34.2 g/dL (ref 32.0–36.0)
MCV: 81.3 fL (ref 80.0–100.0)
Platelets: 351 10*3/uL (ref 150–440)
RBC: 4.89 MIL/uL (ref 3.80–5.20)
RDW: 14.2 % (ref 11.5–14.5)
WBC: 12.2 10*3/uL — ABNORMAL HIGH (ref 3.6–11.0)

## 2016-03-01 LAB — TROPONIN I

## 2016-03-01 NOTE — ED Triage Notes (Signed)
Pt has pain in right upper shoulder/back.  States pain goes into right upper arm.  No known injury.  Pain for 2 hours. Pt reports chest tightness.  No sob.  Pt alert.

## 2016-03-02 ENCOUNTER — Emergency Department
Admission: EM | Admit: 2016-03-02 | Discharge: 2016-03-02 | Disposition: A | Discharge status: Home / Self Care | Payer: Self-pay | Attending: Emergency Medicine | Admitting: Emergency Medicine

## 2016-03-04 ENCOUNTER — Emergency Department: Payer: Self-pay

## 2016-03-04 ENCOUNTER — Encounter: Payer: Self-pay | Admitting: Emergency Medicine

## 2016-03-04 ENCOUNTER — Emergency Department
Admission: EM | Admit: 2016-03-04 | Discharge: 2016-03-04 | Disposition: A | Discharge status: Home / Self Care | Payer: Self-pay | Attending: Student | Admitting: Student

## 2016-03-04 DIAGNOSIS — I10 Essential (primary) hypertension: Secondary | ICD-10-CM | POA: Insufficient documentation

## 2016-03-04 DIAGNOSIS — J45909 Unspecified asthma, uncomplicated: Secondary | ICD-10-CM | POA: Insufficient documentation

## 2016-03-04 DIAGNOSIS — E119 Type 2 diabetes mellitus without complications: Secondary | ICD-10-CM | POA: Insufficient documentation

## 2016-03-04 DIAGNOSIS — Z7984 Long term (current) use of oral hypoglycemic drugs: Secondary | ICD-10-CM | POA: Insufficient documentation

## 2016-03-04 DIAGNOSIS — Z791 Long term (current) use of non-steroidal anti-inflammatories (NSAID): Secondary | ICD-10-CM | POA: Insufficient documentation

## 2016-03-04 DIAGNOSIS — M25511 Pain in right shoulder: Secondary | ICD-10-CM | POA: Insufficient documentation

## 2016-03-04 DIAGNOSIS — Z760 Encounter for issue of repeat prescription: Secondary | ICD-10-CM | POA: Insufficient documentation

## 2016-03-04 DIAGNOSIS — M62838 Other muscle spasm: Secondary | ICD-10-CM | POA: Insufficient documentation

## 2016-03-04 DIAGNOSIS — Z79899 Other long term (current) drug therapy: Secondary | ICD-10-CM | POA: Insufficient documentation

## 2016-03-04 MED ORDER — GLIPIZIDE 10 MG PO TABS: 10.0000 mg | ORAL_TABLET | Freq: Two times a day (BID) | ORAL | 0 refills | Status: DC

## 2016-03-04 MED ORDER — LISINOPRIL 10 MG PO TABS: 10.0000 mg | ORAL_TABLET | Freq: Every day | ORAL | 0 refills | Status: DC

## 2016-03-04 MED ORDER — BACLOFEN 10 MG PO TABS: 10.0000 mg | ORAL_TABLET | Freq: Three times a day (TID) | ORAL | 0 refills | Status: AC | PRN

## 2016-03-04 MED ORDER — IBUPROFEN 800 MG PO TABS: 800.0000 mg | ORAL_TABLET | Freq: Once | ORAL | Status: DC

## 2016-03-04 MED ORDER — PRAVASTATIN SODIUM 10 MG PO TABS: 10.0000 mg | ORAL_TABLET | Freq: Every day | ORAL | 0 refills | Status: DC

## 2016-03-04 MED ORDER — NAPROXEN 500 MG PO TABS: 500.0000 mg | ORAL_TABLET | Freq: Two times a day (BID) | ORAL | 0 refills | Status: DC

## 2016-03-04 MED ORDER — METFORMIN HCL 1000 MG PO TABS: 1000.0000 mg | ORAL_TABLET | Freq: Two times a day (BID) | ORAL | 0 refills | Status: DC

## 2016-03-04 NOTE — ED Provider Notes (Signed)
[facility] Emergency Department Provider Note  ____________________________________________  Time seen: Approximately 11:34 AM  I have reviewed the triage vital signs and the nursing notes.   HISTORY  Chief Complaint Shoulder Pain    HPI Virginia ShutterKelly Bain is a 40 y.o. female , NAD, presents to the emergency department with four-day history of right shoulder pain. Patient states the pain is progressively worsened over the last 4 days. Pain is primarily about the right scapular region. Has had no injuries, traumas or falls. Denies any numbness, weakness, tingling. Was seen in this emergency department last Thursday but unfortunately left without being evaluated. At this time she denies any chest pain, shortness of breath, abdominal pain, nausea or vomiting. Has not noted any rashes or skin sores. Has full range of motion of the shoulder but states it hurts to lift the right arm all the way up. Has not taken anything over-the-counter for her pain.   Past Medical History:  Diagnosis Date  . Anxiety   . Asthma   . Depressed   . Diabetes mellitus without complication (HCC)   . Dysthymia 2016  . Hyperlipidemia   . Hypertension     Patient Active Problem List   Diagnosis Date Noted  . Dysthymia 10/06/2015  . Adjustment disorder with mixed anxiety and depressed mood 10/06/2015    Past Surgical History:  Procedure Laterality Date  . benign neck tumor  2012  . CESAREAN SECTION    . NECK SURGERY      Prior to Admission medications   Medication Sig Start Date End Date Taking? Authorizing Provider  albuterol (PROVENTIL HFA;VENTOLIN HFA) 108 (90 BASE) MCG/ACT inhaler Inhale 2 puffs into the lungs every 6 (six) hours as needed for wheezing or shortness of breath. 04/25/15   Tommi Rumpshonda L Summers, PA-C  ALPRAZolam Prudy Feeler(XANAX) 1 MG tablet Take 1 mg by mouth.    Historical Provider, MD  baclofen (LIORESAL) 10 MG tablet Take 1 tablet (10 mg total) by mouth 3 (three) times daily  as needed for muscle spasms. 03/04/16 03/11/16  Jami L Hagler, PA-C  famotidine (PEPCID) 20 MG tablet Take 1 tablet (20 mg total) by mouth 2 (two) times daily. 01/04/16   Emily FilbertJonathan E Williams, MD  gabapentin (NEURONTIN) 300 MG capsule Take 1 capsule (300 mg total) by mouth 3 (three) times daily. 01/12/16   Diego F Pabon, MD  glipiZIDE (GLUCOTROL) 10 MG tablet Take 1 tablet (10 mg total) by mouth 2 (two) times daily. 03/04/16 03/18/16  Jami L Hagler, PA-C  lisinopril (PRINIVIL,ZESTRIL) 10 MG tablet Take 1 tablet (10 mg total) by mouth daily. 03/04/16 03/18/16  Jami L Hagler, PA-C  meloxicam (MOBIC) 15 MG tablet Take 1 tablet (15 mg total) by mouth daily. 09/21/15   Chinita Pesterari B Triplett, FNP  metFORMIN (GLUCOPHAGE) 1000 MG tablet Take 1 tablet (1,000 mg total) by mouth 2 (two) times daily with a meal. 03/04/16 03/18/16  Jami L Hagler, PA-C  naproxen (NAPROSYN) 500 MG tablet Take 1 tablet (500 mg total) by mouth 2 (two) times daily with a meal. 03/04/16   Jami L Hagler, PA-C  ondansetron (ZOFRAN) 4 MG tablet Take 1 tablet (4 mg total) by mouth daily as needed. 10/21/15   Myrna Blazeravid Matthew Schaevitz, MD  pravastatin (PRAVACHOL) 10 MG tablet Take 1 tablet (10 mg total) by mouth daily. 03/04/16 03/18/16  Jami L Hagler, PA-C  sucralfate (CARAFATE) 1 g tablet Take 1 tablet (1 g total) by mouth 4 (four) times daily. 01/08/16   Dustin FlockGraydon  Derrill Kay, MD    Allergies Review of patient's allergies indicates no known allergies.  Family History  Problem Relation Age of Onset  . Diabetes Mother   . Diabetes Father   . Diabetes Maternal Grandfather   . Diabetes Paternal Grandmother     Social History Social History  Substance Use Topics  . Smoking status: Never Smoker  . Smokeless tobacco: Never Used  . Alcohol use No     Review of Systems  Constitutional: No fatigue Cardiovascular: No chest pain. Respiratory: No shortness of breath.  Gastrointestinal: No abdominal pain.  No nausea, vomiting.   Musculoskeletal: Positive  right shoulder pain.  Skin: Negative for rash, redness, swelling, skin sores. Neurological: Negative for this, weakness, tingling. 10-point ROS otherwise negative.  ____________________________________________   PHYSICAL EXAM:  VITAL SIGNS: ED Triage Vitals  Enc Vitals Group     BP 03/04/16 1113 (!) 154/74     Pulse Rate 03/04/16 1113 90     Resp --      Temp 03/04/16 1113 98.5 F (36.9 C)     Temp Source 03/04/16 1113 Oral     SpO2 03/04/16 1113 96 %     Weight 03/04/16 1116 240 lb (108.9 kg)     Height 03/04/16 1116 5\' 4"  (1.626 m)     Head Circumference --      Peak Flow --      Pain Score 03/04/16 1117 8     Pain Loc --      Pain Edu? --      Excl. in GC? --      Constitutional: Alert and oriented. Well appearing and in no acute distress. Eyes: Conjunctivae are normal.  Head: Atraumatic. Neck:  No cervical spine tenderness to palpation. No trapezial muscle spasms. Supple with full range of motion. Cardiovascular: Normal rate, regular rhythm. Normal S1 and S2.  Good peripheral circulation. Respiratory: Normal respiratory effort without tachypnea or retractions. Lungs CTAB with breath sounds noted in all lung fields. Musculoskeletal: Full range of motion of right shoulder without difficulty. Mild pain with full abduction and extension. Tenderness to palpation about the musculoskeletal region medial to the right scapula with mild muscle spasm. No thoracic spinal tenderness to palpation. Neurologic:  Normal speech and language. No gross focal neurologic deficits are appreciated.  Skin:  Skin is warm, dry and intact. No rash, redness, swelling, skin sores noted. Psychiatric: Mood and affect are normal. Speech and behavior are normal. Patient exhibits appropriate insight and judgement.   ____________________________________________    LABS  None ____________________________________________  EKG  None ____________________________________________  RADIOLOGY  None ____________________________________________    PROCEDURES  Procedure(s) performed: None   Procedures   Medications - No data to display   ____________________________________________   INITIAL IMPRESSION / ASSESSMENT AND PLAN / ED COURSE  Pertinent labs & imaging results that were available during my care of the patient were reviewed by me and considered in my medical decision making (see chart for details).  Clinical Course  Comment By Time  Patient has declined Toradol. Hope Pigeon, PA-C 10/01 1137  At the time of discharge patient requested refill on her metformin, pravastatin, lisinopril and glipizide. Patient was seen in this emergency department recently for medication refill given 30 days. Explained to the patient that I would give 2 weeks worth of her medications today but she would need to establish care with an outpatient primary care facility to gain any further refills.  Hope Pigeon, PA-C 10/01 1235  Patient's diagnosis is consistent with Muscle spasm, acute pain of right shoulder and medication refill. Patient will be discharged home with prescriptions for baclofen and Naprosyn to take as directed. Patient is to follow up with Dr. Hyacinth Meeker in orthopedics if symptoms persist past this treatment course. Patient is given ED precautions to return to the ED for any worsening or new symptoms.    ____________________________________________  FINAL CLINICAL IMPRESSION(S) / ED DIAGNOSES  Final diagnoses:  Muscle spasm  Acute pain of right shoulder  Medication refill      NEW MEDICATIONS STARTED DURING THIS VISIT:  Discharge Medication List as of 03/04/2016 12:34 PM    START taking these medications   Details  baclofen (LIORESAL) 10 MG tablet Take 1 tablet (10 mg total) by mouth 3 (three) times daily as needed for  muscle spasms., Starting Sun 03/04/2016, Until Sun 03/11/2016, Print    naproxen (NAPROSYN) 500 MG tablet Take 1 tablet (500 mg total) by mouth 2 (two) times daily with a meal., Starting Sun 03/04/2016, Print             Hope Pigeon, PA-C 03/04/16 1319    Gayla Doss, MD 03/04/16 1549

## 2016-03-04 NOTE — ED Triage Notes (Signed)
Pt with right shoulder pain that started Thursday. Came here Thursday, had ekg and blood work and then left before being seen. Denies chest pain, sob. Denies injury. Shoulder started hurting mid-day Thursday.

## 2016-03-04 NOTE — Discharge Instructions (Signed)
Please establish care with a local primary care for further medication refills

## 2016-04-24 ENCOUNTER — Ambulatory Visit: Payer: Self-pay | Admitting: Gastroenterology

## 2016-05-25 ENCOUNTER — Emergency Department
Admission: EM | Admit: 2016-05-25 | Discharge: 2016-05-25 | Disposition: A | Discharge status: Home / Self Care | Payer: Self-pay | Attending: Emergency Medicine | Admitting: Emergency Medicine

## 2016-05-25 ENCOUNTER — Encounter: Payer: Self-pay | Admitting: Emergency Medicine

## 2016-05-25 DIAGNOSIS — Z79899 Other long term (current) drug therapy: Secondary | ICD-10-CM | POA: Insufficient documentation

## 2016-05-25 DIAGNOSIS — Z7984 Long term (current) use of oral hypoglycemic drugs: Secondary | ICD-10-CM | POA: Insufficient documentation

## 2016-05-25 DIAGNOSIS — M25571 Pain in right ankle and joints of right foot: Secondary | ICD-10-CM | POA: Insufficient documentation

## 2016-05-25 DIAGNOSIS — E119 Type 2 diabetes mellitus without complications: Secondary | ICD-10-CM | POA: Insufficient documentation

## 2016-05-25 DIAGNOSIS — I10 Essential (primary) hypertension: Secondary | ICD-10-CM | POA: Insufficient documentation

## 2016-05-25 DIAGNOSIS — J45909 Unspecified asthma, uncomplicated: Secondary | ICD-10-CM | POA: Insufficient documentation

## 2016-05-25 MED ORDER — NAPROXEN 500 MG PO TABS: 500.0000 mg | ORAL_TABLET | Freq: Two times a day (BID) | ORAL | 0 refills | Status: DC

## 2016-05-25 MED ORDER — KETOROLAC TROMETHAMINE 30 MG/ML IJ SOLN
30.0000 mg | Freq: Once | INTRAMUSCULAR | Status: AC
  Administered 2016-05-25: 30 mg via INTRAMUSCULAR
  Filled 2016-05-25: qty 1

## 2016-05-25 NOTE — ED Notes (Signed)
Pt discharged home after verbalizing understanding of discharge instructions; nad noted. 

## 2016-05-25 NOTE — ED Triage Notes (Signed)
Pt presents with pain in right ankle x 2 days. Denies injury or trauma. Pt states that it is swollen and that she has used home remedies to help swelling but it hasn't helped. Pt states she has prior fx to this ankle. Pt alert & oriented with NAD noted.

## 2016-05-25 NOTE — ED Provider Notes (Signed)
[facility] Emergency Department Provider Note  ____________________________________________  Time seen: Approximately 8:11 AM  I have reviewed the triage vital signs and the nursing notes.   HISTORY  Chief Complaint Ankle Pain    HPI Virginia Peterson is a 39 y.o. female , NAD, presents to the emergency department today history of right ankle pain. Patient states she had onset of right ankle pain 2 days ago without injury, trauma or fall. Has not noted any redness, warmth or swelling. No skin sores or bruising. States the pain is deep in the joint and denies any foot, toe or lower leg pain. Denies numbness, wheezes, tingling. Has not taken anything over-the-counter for symptoms. Has been able to bear weight and walk without difficulty.   Past Medical History:  Diagnosis Date  . Anxiety   . Asthma   . Depressed   . Diabetes mellitus without complication (HCC)   . Dysthymia 2016  . Hyperlipidemia   . Hypertension     Patient Active Problem List   Diagnosis Date Noted  . Dysthymia 10/06/2015  . Adjustment disorder with mixed anxiety and depressed mood 10/06/2015    Past Surgical History:  Procedure Laterality Date  . benign neck tumor  2012  . CESAREAN SECTION    . NECK SURGERY      Prior to Admission medications   Medication Sig Start Date End Date Taking? Authorizing Provider  albuterol (PROVENTIL HFA;VENTOLIN HFA) 108 (90 BASE) MCG/ACT inhaler Inhale 2 puffs into the lungs every 6 (six) hours as needed for wheezing or shortness of breath. 04/25/15   Tommi Rumps, PA-C  ALPRAZolam Prudy Feeler) 1 MG tablet Take 1 mg by mouth.    Historical Provider, MD  famotidine (PEPCID) 20 MG tablet Take 1 tablet (20 mg total) by mouth 2 (two) times daily. 01/04/16   Emily Filbert, MD  gabapentin (NEURONTIN) 300 MG capsule Take 1 capsule (300 mg total) by mouth 3 (three) times daily. 01/12/16   Diego F Pabon, MD  glipiZIDE (GLUCOTROL) 10 MG tablet Take 1 tablet  (10 mg total) by mouth 2 (two) times daily. 03/04/16 03/18/16  Jami L Hagler, PA-C  lisinopril (PRINIVIL,ZESTRIL) 10 MG tablet Take 1 tablet (10 mg total) by mouth daily. 03/04/16 03/18/16  Jami L Hagler, PA-C  meloxicam (MOBIC) 15 MG tablet Take 1 tablet (15 mg total) by mouth daily. 09/21/15   Chinita Pester, FNP  metFORMIN (GLUCOPHAGE) 1000 MG tablet Take 1 tablet (1,000 mg total) by mouth 2 (two) times daily with a meal. 03/04/16 03/18/16  Jami L Hagler, PA-C  naproxen (NAPROSYN) 500 MG tablet Take 1 tablet (500 mg total) by mouth 2 (two) times daily with a meal. 05/25/16   Jami L Hagler, PA-C  ondansetron (ZOFRAN) 4 MG tablet Take 1 tablet (4 mg total) by mouth daily as needed. 10/21/15   Myrna Blazer, MD  pravastatin (PRAVACHOL) 10 MG tablet Take 1 tablet (10 mg total) by mouth daily. 03/04/16 03/18/16  Jami L Hagler, PA-C  sucralfate (CARAFATE) 1 g tablet Take 1 tablet (1 g total) by mouth 4 (four) times daily. 01/08/16   Phineas Semen, MD    Allergies Patient has no known allergies.  Family History  Problem Relation Age of Onset  . Diabetes Mother   . Diabetes Father   . Diabetes Maternal Grandfather   . Diabetes Paternal Grandmother     Social History Social History  Substance Use Topics  . Smoking status: Never Smoker  . Smokeless  tobacco: Never Used  . Alcohol use No     Comment: occasional     Review of Systems  Constitutional: No fever/chills Musculoskeletal: Positive right ankle pain. No right lower leg, foot, toe pain.  Skin: Negative for rash, redness, swelling, and was, skin sores. Neurological: Negative for numbness, weakness, tingling  ____________________________________________   PHYSICAL EXAM:  VITAL SIGNS: ED Triage Vitals  Enc Vitals Group     BP      Pulse      Resp      Temp      Temp src      SpO2      Weight      Height      Head Circumference      Peak Flow      Pain Score      Pain Loc      Pain Edu?      Excl. in GC?       Constitutional: Alert and oriented. Well appearing and in no acute distress. Eyes: Conjunctivae are normal.  Head: Atraumatic. Cardiovascular: Good peripheral circulation with 2+ pulses noted in the right lower extremity. Capillary refill is brisk in all digits of the right foot. Respiratory: Normal respiratory effort without tachypnea or retractions.  Musculoskeletal: Full range of motion of bilateral ankles, feet and toes without pain or difficulty. Strength is right ankle is 5 out of 5 and equal to the left. No laxity with anterior or posterior drawer. No laxity with varus or valgus stress. No bony abnormalities or crepitus with palpation of the right lower leg, ankle, foot or toes. No lower extremity tenderness nor edema.  No joint effusions. Neurologic:  Normal speech and language. Normal gait and posture. No gross focal neurologic deficits are appreciated. Sensation to light touch grossly intact. The right lower extremity. Skin:  Skin is warm, dry and intact. No rash, redness, warmth, bruising, skin sores or open wounds noted. Psychiatric: Mood and affect are normal. Speech and behavior are normal. Patient exhibits appropriate insight and judgement.   ____________________________________________   LABS  None ____________________________________________  EKG  None ____________________________________________  RADIOLOGY  None ____________________________________________    PROCEDURES  Procedure(s) performed: None   Procedures   Medications  ketorolac (TORADOL) 30 MG/ML injection 30 mg (30 mg Intramuscular Given 05/25/16 0828)     ____________________________________________   INITIAL IMPRESSION / ASSESSMENT AND PLAN / ED COURSE  Pertinent labs & imaging results that were available during my care of the patient were reviewed by me and considered in my medical decision making (see chart for details).  Clinical Course     Patient's diagnosis is  consistent with arthralgia of the right ankle. Patient has no evidence of cellulitis, there is no edema, pain or abnormal swelling to the ankles suggest DVT. Pulses intact with brisk capillary refill showing good blood flow. Patient has normal sensation of the right lower extremity. There was no blunt trauma, fall or other injury to suggest fractures. No pain in the foot to suggest plantar fasciitis or other abnormality. Patient was visualized ambulating without limp or difficulty. Patient was given Toradol IM in the emergency department and ankle was wrapped in an Ace wrap for comfort care. Patient will be discharged home with prescriptions for naproxen to take as directed. Patient is to follow up with Dr. Rosita KeaMenz in orthopedics if symptoms persist past this treatment course. Patient is also advised to establish care with one of the local outpatient community clinics for primary care services.  Patient is given ED precautions to return to the ED for any worsening or new symptoms.   ____________________________________________  FINAL CLINICAL IMPRESSION(S) / ED DIAGNOSES  Final diagnoses:  Arthralgia of right ankle      NEW MEDICATIONS STARTED DURING THIS VISIT:  New Prescriptions   NAPROXEN (NAPROSYN) 500 MG TABLET    Take 1 tablet (500 mg total) by mouth 2 (two) times daily with a meal.         Hope PigeonJami L Hagler, PA-C 05/25/16 0839    Sharman CheekPhillip Stafford, MD 05/25/16 1555

## 2016-05-25 NOTE — ED Notes (Signed)
Ace wrap applied to right ankle.

## 2016-06-13 ENCOUNTER — Emergency Department
Admission: EM | Admit: 2016-06-13 | Discharge: 2016-06-13 | Disposition: A | Discharge status: Home / Self Care | Payer: Self-pay | Attending: Emergency Medicine | Admitting: Emergency Medicine

## 2016-06-13 ENCOUNTER — Encounter: Payer: Self-pay | Admitting: *Deleted

## 2016-06-13 DIAGNOSIS — I10 Essential (primary) hypertension: Secondary | ICD-10-CM | POA: Insufficient documentation

## 2016-06-13 DIAGNOSIS — R739 Hyperglycemia, unspecified: Secondary | ICD-10-CM

## 2016-06-13 DIAGNOSIS — J45909 Unspecified asthma, uncomplicated: Secondary | ICD-10-CM | POA: Insufficient documentation

## 2016-06-13 DIAGNOSIS — E1165 Type 2 diabetes mellitus with hyperglycemia: Secondary | ICD-10-CM | POA: Insufficient documentation

## 2016-06-13 DIAGNOSIS — Z79899 Other long term (current) drug therapy: Secondary | ICD-10-CM | POA: Insufficient documentation

## 2016-06-13 LAB — CBC
HEMATOCRIT: 36.7 % (ref 35.0–47.0)
Hemoglobin: 12.6 g/dL (ref 12.0–16.0)
MCH: 28.8 pg (ref 26.0–34.0)
MCHC: 34.4 g/dL (ref 32.0–36.0)
MCV: 83.6 fL (ref 80.0–100.0)
Platelets: 366 10*3/uL (ref 150–440)
RBC: 4.39 MIL/uL (ref 3.80–5.20)
RDW: 13.4 % (ref 11.5–14.5)
WBC: 8.8 10*3/uL (ref 3.6–11.0)

## 2016-06-13 LAB — COMPREHENSIVE METABOLIC PANEL
ALBUMIN: 3.5 g/dL (ref 3.5–5.0)
ALT: 18 U/L (ref 14–54)
AST: 21 U/L (ref 15–41)
Alkaline Phosphatase: 63 U/L (ref 38–126)
Anion gap: 8 (ref 5–15)
BUN: 11 mg/dL (ref 6–20)
CO2: 25 mmol/L (ref 22–32)
Calcium: 9.2 mg/dL (ref 8.9–10.3)
Chloride: 99 mmol/L — ABNORMAL LOW (ref 101–111)
Creatinine, Ser: 0.76 mg/dL (ref 0.44–1.00)
GFR calc Af Amer: >60 mL/min (ref >60)
GFR calc non Af Amer: >60 mL/min (ref >60)
GLUCOSE: 328 mg/dL — AB (ref 65–99)
POTASSIUM: 4.1 mmol/L (ref 3.5–5.1)
Sodium: 132 mmol/L — ABNORMAL LOW (ref 135–145)
TOTAL PROTEIN: 7.3 g/dL (ref 6.5–8.1)

## 2016-06-13 LAB — LIPASE, BLOOD: Lipase: 28 U/L (ref 11–51)

## 2016-06-13 MED ORDER — METFORMIN HCL 1000 MG PO TABS: 1000.0000 mg | ORAL_TABLET | Freq: Two times a day (BID) | ORAL | 0 refills | Status: DC

## 2016-06-13 MED ORDER — LISINOPRIL 10 MG PO TABS: 10.0000 mg | ORAL_TABLET | Freq: Every day | ORAL | 0 refills | Status: AC

## 2016-06-13 MED ORDER — GLIPIZIDE 10 MG PO TABS: 10.0000 mg | ORAL_TABLET | Freq: Two times a day (BID) | ORAL | 0 refills | Status: AC

## 2016-06-13 MED ORDER — METFORMIN HCL 500 MG PO TABS
1000.0000 mg | ORAL_TABLET | Freq: Once | ORAL | Status: AC
  Administered 2016-06-13: 1000 mg via ORAL
  Filled 2016-06-13: qty 2

## 2016-06-13 NOTE — ED Notes (Signed)
Pt reports +PO fluid/food intake, 1 episode of emesis last night.  Epigastric pain w/ eating, hx of GERD.  NAD.

## 2016-06-13 NOTE — ED Notes (Signed)
Pt verbalizes understanding of discharge instructions, provided pt with [facility]

## 2016-06-13 NOTE — ED Notes (Signed)
Pt awake alert oriented, no respiratory distress noted, reports not taking her medications for diabetes Metformin 1000mg , Glipizide 5mg , pt reports she does have take her cholesterol medications. Reports feeling thirsty, and going to restroom often, denies any other symptoms at present

## 2016-06-13 NOTE — ED Notes (Signed)
MD at bedside. 

## 2016-06-13 NOTE — ED Triage Notes (Signed)
States abd pain that began this AM, states she has been eating and drinking with no problem, states chills, awake and alert in no acute distress

## 2016-06-13 NOTE — ED Provider Notes (Signed)
[facility] Emergency Department Provider Note   ____________________________________________    I have reviewed the triage vital signs and the nursing notes.   HISTORY  Chief Complaint Dehydration    HPI Virginia Peterson is a 41 y.o. female reports she feels dehydrated. Patient reports she has run out of her medication for her diabetes. Typically she takes metformin and glipizide. She also doesn't have her blood pressure medication. She denies abdominal pain to me. She complained she was thirsty all the time. No nausea or vomiting. No recent travel.   Past Medical History:  Diagnosis Date  . Anxiety   . Asthma   . Depressed   . Diabetes mellitus without complication (HCC)   . Dysthymia 2016  . Hyperlipidemia   . Hypertension     Patient Active Problem List   Diagnosis Date Noted  . Dysthymia 10/06/2015  . Adjustment disorder with mixed anxiety and depressed mood 10/06/2015    Past Surgical History:  Procedure Laterality Date  . benign neck tumor  2012  . CESAREAN SECTION    . NECK SURGERY      Prior to Admission medications   Medication Sig Start Date End Date Taking? Authorizing Provider  albuterol (PROVENTIL HFA;VENTOLIN HFA) 108 (90 BASE) MCG/ACT inhaler Inhale 2 puffs into the lungs every 6 (six) hours as needed for wheezing or shortness of breath. 04/25/15   Tommi Rumpshonda L Summers, PA-C  ALPRAZolam Prudy Feeler(XANAX) 1 MG tablet Take 1 mg by mouth.    Historical Provider, MD  famotidine (PEPCID) 20 MG tablet Take 1 tablet (20 mg total) by mouth 2 (two) times daily. 01/04/16   Emily FilbertJonathan E Williams, MD  gabapentin (NEURONTIN) 300 MG capsule Take 1 capsule (300 mg total) by mouth 3 (three) times daily. 01/12/16   Diego F Pabon, MD  glipiZIDE (GLUCOTROL) 10 MG tablet Take 1 tablet (10 mg total) by mouth 2 (two) times daily. 06/13/16 07/13/16  Jene Everyobert Zyrus Hetland, MD  lisinopril (PRINIVIL,ZESTRIL) 10 MG tablet Take 1 tablet (10 mg total) by mouth daily. 06/13/16 07/13/16   Jene Everyobert Agata Lucente, MD  meloxicam (MOBIC) 15 MG tablet Take 1 tablet (15 mg total) by mouth daily. 09/21/15   Chinita Pesterari B Triplett, FNP  metFORMIN (GLUCOPHAGE) 1000 MG tablet Take 1 tablet (1,000 mg total) by mouth 2 (two) times daily with a meal. 06/13/16 07/13/16  Jene Everyobert Ellinore Merced, MD  naproxen (NAPROSYN) 500 MG tablet Take 1 tablet (500 mg total) by mouth 2 (two) times daily with a meal. 05/25/16   Jami L Hagler, PA-C  ondansetron (ZOFRAN) 4 MG tablet Take 1 tablet (4 mg total) by mouth daily as needed. 10/21/15   Myrna Blazeravid Matthew Schaevitz, MD  pravastatin (PRAVACHOL) 10 MG tablet Take 1 tablet (10 mg total) by mouth daily. 03/04/16 03/18/16  Jami L Hagler, PA-C  sucralfate (CARAFATE) 1 g tablet Take 1 tablet (1 g total) by mouth 4 (four) times daily. 01/08/16   Phineas SemenGraydon Goodman, MD     Allergies Patient has no known allergies.  Family History  Problem Relation Age of Onset  . Diabetes Mother   . Diabetes Father   . Diabetes Maternal Grandfather   . Diabetes Paternal Grandmother     Social History Social History  Substance Use Topics  . Smoking status: Never Smoker  . Smokeless tobacco: Never Used  . Alcohol use No     Comment: occasional    Review of Systems  Constitutional: No fever/chills Eyes: No visual changes.  ENT: No sore throat.Positive for  polydipsia Cardiovascular: Denies chest pain. Respiratory: Denies shortness of breath. Gastrointestinal: No abdominal pain.  No nausea, no vomiting.   Genitourinary: Positive for polyuria Musculoskeletal: Negative for back pain. Skin: Negative for rash. Neurological: Negative for headaches    ____________________________________________   PHYSICAL EXAM:  VITAL SIGNS: ED Triage Vitals  Enc Vitals Group     BP 06/13/16 1825 (!) 147/93     Pulse Rate 06/13/16 1825 96     Resp 06/13/16 1825 18     Temp 06/13/16 1825 99.2 F (37.3 C)     Temp Source 06/13/16 1825 Oral     SpO2 06/13/16 1825 98 %     Weight 06/13/16 1826 240 lb (108.9 kg)       Height 06/13/16 1826 5\' 3"  (1.6 m)     Head Circumference --      Peak Flow --      Pain Score 06/13/16 1826 8     Pain Loc --      Pain Edu? --      Excl. in GC? --     Constitutional: Alert and oriented. No acute distress. Pleasant and interactive Eyes: Conjunctivae are normal.   Nose: No congestion/rhinnorhea. Mouth/Throat: Mucous membranes are moist.    Cardiovascular: Normal rate, regular rhythm. Grossly normal heart sounds.  Good peripheral circulation. Respiratory: Normal respiratory effort.  No retractions. Lungs CTAB. Gastrointestinal: Soft and nontender. No distention.   Genitourinary: deferred Musculoskeletal: No lower extremity tenderness nor edema.  Warm and well perfused Neurologic:  . No gross focal neurologic deficits are appreciated.  Skin:  Skin is warm, dry and intact. No rash noted. Psychiatric: Mood and affect are normal. Speech and behavior are normal.  ____________________________________________   LABS (all labs ordered are listed, but only abnormal results are displayed)  Labs Reviewed  COMPREHENSIVE METABOLIC PANEL - Abnormal; Notable for the following:       Result Value   Sodium 132 (*)    Chloride 99 (*)    Glucose, Bld 328 (*)    Total Bilirubin <0.1 (*)    All other components within normal limits  LIPASE, BLOOD  CBC  URINALYSIS, COMPLETE (UACMP) WITH MICROSCOPIC  POC URINE PREG, ED   ____________________________________________  EKG  None ____________________________________________  RADIOLOGY  None ____________________________________________   PROCEDURES  Procedure(s) performed: No    Critical Care performed: No ____________________________________________   INITIAL IMPRESSION / ASSESSMENT AND PLAN / ED COURSE  Pertinent labs & imaging results that were available during my care of the patient were reviewed by me and considered in my medical decision making (see chart for details).  Patient presents primarily  for a refill of her medications. She has mild hyperglycemic however and I got is normal. Her heart rate and labs are otherwise unremarkable. We will give her metformin 1000 mg in the emergency department and prescriptions for her other medications. Recommended outpatient follow-up and establishment of primary care provider.  Clinical Course    ____________________________________________   FINAL CLINICAL IMPRESSION(S) / ED DIAGNOSES  Final diagnoses:  Hyperglycemia      NEW MEDICATIONS STARTED DURING THIS VISIT:  Discharge Medication List as of 06/13/2016  8:43 PM       Note:  This document was prepared using Dragon voice recognition software and may include unintentional dictation errors.    Jene Every, MD 06/13/16 2234

## 2016-07-20 ENCOUNTER — Other Ambulatory Visit: Payer: Self-pay | Admitting: Internal Medicine

## 2016-07-20 DIAGNOSIS — G894 Chronic pain syndrome: Secondary | ICD-10-CM | POA: Insufficient documentation

## 2016-07-20 DIAGNOSIS — J45909 Unspecified asthma, uncomplicated: Secondary | ICD-10-CM | POA: Insufficient documentation

## 2016-07-20 DIAGNOSIS — E119 Type 2 diabetes mellitus without complications: Secondary | ICD-10-CM | POA: Insufficient documentation

## 2016-07-20 DIAGNOSIS — I10 Essential (primary) hypertension: Secondary | ICD-10-CM | POA: Insufficient documentation

## 2016-07-20 DIAGNOSIS — F411 Generalized anxiety disorder: Secondary | ICD-10-CM | POA: Insufficient documentation

## 2016-07-20 DIAGNOSIS — M5137 Other intervertebral disc degeneration, lumbosacral region: Secondary | ICD-10-CM | POA: Insufficient documentation

## 2016-07-20 DIAGNOSIS — R05 Cough: Secondary | ICD-10-CM

## 2016-07-20 DIAGNOSIS — E785 Hyperlipidemia, unspecified: Secondary | ICD-10-CM | POA: Insufficient documentation

## 2016-07-20 DIAGNOSIS — R053 Chronic cough: Secondary | ICD-10-CM

## 2016-07-28 ENCOUNTER — Emergency Department: Payer: BLUE CROSS/BLUE SHIELD

## 2016-07-28 ENCOUNTER — Emergency Department
Admission: EM | Admit: 2016-07-28 | Discharge: 2016-07-28 | Disposition: A | Discharge status: Home / Self Care | Payer: BLUE CROSS/BLUE SHIELD | Attending: Emergency Medicine | Admitting: Emergency Medicine

## 2016-07-28 ENCOUNTER — Encounter: Payer: Self-pay | Admitting: Emergency Medicine

## 2016-07-28 DIAGNOSIS — R0789 Other chest pain: Secondary | ICD-10-CM | POA: Diagnosis present

## 2016-07-28 DIAGNOSIS — E119 Type 2 diabetes mellitus without complications: Secondary | ICD-10-CM | POA: Insufficient documentation

## 2016-07-28 DIAGNOSIS — J45909 Unspecified asthma, uncomplicated: Secondary | ICD-10-CM | POA: Diagnosis not present

## 2016-07-28 DIAGNOSIS — I10 Essential (primary) hypertension: Secondary | ICD-10-CM | POA: Insufficient documentation

## 2016-07-28 DIAGNOSIS — Z79899 Other long term (current) drug therapy: Secondary | ICD-10-CM | POA: Diagnosis not present

## 2016-07-28 DIAGNOSIS — Z7984 Long term (current) use of oral hypoglycemic drugs: Secondary | ICD-10-CM | POA: Diagnosis not present

## 2016-07-28 DIAGNOSIS — Z791 Long term (current) use of non-steroidal anti-inflammatories (NSAID): Secondary | ICD-10-CM | POA: Insufficient documentation

## 2016-07-28 LAB — URINALYSIS, COMPLETE (UACMP) WITH MICROSCOPIC
BILIRUBIN URINE: NEGATIVE
GLUCOSE, UA: NEGATIVE mg/dL
Ketones, ur: 5 mg/dL — AB
NITRITE: NEGATIVE
PH: 5 (ref 5.0–8.0)
PROTEIN: 100 mg/dL — AB
RBC / HPF: NONE SEEN RBC/hpf (ref 0–5)
Specific Gravity, Urine: 1.023 (ref 1.005–1.030)

## 2016-07-28 LAB — CBC
HEMATOCRIT: 37.4 % (ref 35.0–47.0)
HEMOGLOBIN: 12.7 g/dL (ref 12.0–16.0)
MCH: 28.2 pg (ref 26.0–34.0)
MCHC: 34 g/dL (ref 32.0–36.0)
MCV: 83.1 fL (ref 80.0–100.0)
Platelets: 393 10*3/uL (ref 150–440)
RBC: 4.51 MIL/uL (ref 3.80–5.20)
RDW: 13.6 % (ref 11.5–14.5)
WBC: 9.5 10*3/uL (ref 3.6–11.0)

## 2016-07-28 LAB — BASIC METABOLIC PANEL
ANION GAP: 8 (ref 5–15)
BUN: 11 mg/dL (ref 6–20)
CALCIUM: 9.1 mg/dL (ref 8.9–10.3)
CO2: 25 mmol/L (ref 22–32)
Chloride: 104 mmol/L (ref 101–111)
Creatinine, Ser: 0.69 mg/dL (ref 0.44–1.00)
GLUCOSE: 130 mg/dL — AB (ref 65–99)
POTASSIUM: 4.1 mmol/L (ref 3.5–5.1)
Sodium: 137 mmol/L (ref 135–145)

## 2016-07-28 LAB — POCT PREGNANCY, URINE: Preg Test, Ur: NEGATIVE

## 2016-07-28 LAB — TROPONIN I

## 2016-07-28 MED ORDER — LORAZEPAM 1 MG PO TABS: 1.0000 mg | ORAL_TABLET | Freq: Two times a day (BID) | ORAL | 0 refills | Status: DC | PRN

## 2016-07-28 MED ORDER — HYDROCODONE-ACETAMINOPHEN 5-325 MG PO TABS
1.0000 | ORAL_TABLET | Freq: Once | ORAL | Status: AC
  Administered 2016-07-28: 1 via ORAL
  Filled 2016-07-28: qty 1

## 2016-07-28 MED ORDER — LIDOCAINE 5 % EX PTCH
1.0000 | MEDICATED_PATCH | CUTANEOUS | Status: DC
  Administered 2016-07-28: 1 via TRANSDERMAL
  Filled 2016-07-28: qty 1

## 2016-07-28 MED ORDER — LIDOCAINE 5 % EX PTCH: 1.0000 | MEDICATED_PATCH | CUTANEOUS | 0 refills | Status: DC

## 2016-07-28 NOTE — ED Triage Notes (Addendum)
Pt c/o central chest pain and lower back pain. Going through lot of problems and arguing with husband. Denies nausea or vomiting. NAD.

## 2016-07-28 NOTE — Discharge Instructions (Signed)
Please continue with over-the-counter Tylenol and/or ibuprofen for pain. Ibuprofen can irritate the stomach and he may want to side with Tylenol around-the-clock. Please contact your therapist and also your primary physician for follow-up concerning stress, anxiety, and related musculoskeletal pain. Please return immediately if condition worsens. Please contact her primary physician or the physician you were given for referral. If you have any specialist physicians involved in her treatment and plan please also contact them. Thank you for using [facility] emergency [facility].

## 2016-07-28 NOTE — ED Provider Notes (Signed)
Time Seen: Approximately 1418  I have reviewed the triage notes  Chief Complaint: Chest Pain   History of Present Illness: Virginia Peterson is a 41 y.o. female who presents with a history of anxiety and chronic musculoskeletal back pain and various discomforts. She states she just recently saw her primary physician or put on Ultram for back pain. She denies any difficulty with urination or bowel movements. She denies any dysuria, hematuria or urinary frequency. Patient's primary complaint today is some intermittent chest discomfort that she's had over the last couple of days. She again states that she is under a lot of stress mostly marital and states that she had an episode of hyperventilation and has some reproducible chest pain and points primarily right to the sternal area. She denies any radiation to the arm or jaw area. She denies any deep venous thrombosis or pulmonary emboli risk factors. No leg pain or swelling. She denies any history of cardiovascular disease. She does have a history of Non-insulin-dependent diabetes and hypertension.   Past Medical History:  Diagnosis Date  . Anxiety   . Asthma   . Depressed   . Diabetes mellitus without complication (HCC)   . Dysthymia 2016  . Hyperlipidemia   . Hypertension     Patient Active Problem List   Diagnosis Date Noted  . Dysthymia 10/06/2015  . Adjustment disorder with mixed anxiety and depressed mood 10/06/2015    Past Surgical History:  Procedure Laterality Date  . benign neck tumor  2012  . CESAREAN SECTION    . NECK SURGERY      Past Surgical History:  Procedure Laterality Date  . benign neck tumor  2012  . CESAREAN SECTION    . NECK SURGERY      Current Outpatient Rx  . Order #: [id number] Class: Print  . Order #: [id number] Class: Historical Med  . Order #: [id number] Class: Print  . Order #: [id number] Class: Normal  . Order #: [id number] Class: Print  . Order #: [id number] Class: Print  . Order #: [id number] Class:  Print  . Order #: [id number] Class: Print  . Order #: [id number] Class: Print  . Order #: [id number] Class: Print  . Order #: [id number] Class: Print  . Order #: [id number] Class: Print  . Order #: [id number] Class: Print  . Order #: [id number] Class: Print    Allergies:  Patient has no known allergies.  Family History: Family History  Problem Relation Age of Onset  . Diabetes Mother   . Diabetes Father   . Diabetes Maternal Grandfather   . Diabetes Paternal Grandmother     Social History: Social History  Substance Use Topics  . Smoking status: Never Smoker  . Smokeless tobacco: Never Used  . Alcohol use No     Comment: occasional     Review of Systems:   10 point review of systems was performed and was otherwise negative:  Constitutional: No fever Eyes: No visual disturbances ENT: No sore throat, ear pain Cardiac: Chest pains mainly substernal and pleuritic in nature Respiratory: One episode described hyperventilation with no persistent shortness of breath or wheezing. Abdomen: No abdominal pain, no vomiting, No diarrhea Endocrine: No weight loss, No night sweats Extremities: No peripheral edema, cyanosis. No calf tenderness or swelling Skin: No rashes, easy bruising Neurologic: No focal weakness, trouble with speech or swollowing Urologic: No dysuria, Hematuria, or urinary frequency *  Physical Exam:  ED Triage Vitals  Enc Vitals Group     BP 07/28/16 1308 122/78     Pulse Rate 07/28/16 1308 94  Resp 07/28/16 1308 20     Temp 07/28/16 1308 98.7 F (37.1 C)     Temp Source 07/28/16 1308 Oral     SpO2 07/28/16 1308 98 %     Weight 07/28/16 1306 240 lb (108.9 kg)     Height 07/28/16 1306 5\' 4"  (1.626 m)     Head Circumference --      Peak Flow --      Pain Score 07/28/16 1306 10     Pain Loc --      Pain Edu? --      Excl. in GC? --     General: Awake , Alert , and Oriented times 3; GCS 15 Anxious, somewhat scattered historian Head: Normal cephalic ,  atraumatic Eyes: Pupils equal , round, reactive to light Nose/Throat: No nasal drainage, patent upper airway without erythema or exudate.  Neck: Supple, Full range of motion, No anterior adenopathy or palpable thyroid masses Lungs: Clear to ascultation without wheezes , rhonchi, or rales Heart: Regular rate, regular rhythm without murmurs , gallops , or rubs Abdomen: Soft, non tender without rebound, guarding , or rigidity; bowel sounds positive and symmetric in all 4 quadrants. No organomegaly .        Extremities: 2 plus symmetric pulses. No edema, clubbing or cyanosis Neurologic: normal ambulation, Motor symmetric without deficits, sensory intact Skin: warm, dry, no rashes Chest wall shows reproducible pain over the sternal area without crepitus or step-off noted  Labs:   All laboratory work was reviewed including any pertinent negatives or positives listed below:  Labs Reviewed  BASIC METABOLIC PANEL - Abnormal; Notable for the following:       Result Value   Glucose, Bld 130 (*)    All other components within normal limits  URINALYSIS, COMPLETE (UACMP) WITH MICROSCOPIC - Abnormal; Notable for the following:    Color, Urine YELLOW (*)    APPearance TURBID (*)    Hgb urine dipstick SMALL (*)    Ketones, ur 5 (*)    Protein, ur 100 (*)    Leukocytes, UA SMALL (*)    Bacteria, UA MANY (*)    Squamous Epithelial / LPF TOO NUMEROUS TO COUNT (*)    All other components within normal limits  CBC  TROPONIN I  POCT PREGNANCY, URINE  POC URINE PREG, ED  Laboratory work was reviewed and showed no clinically significant abnormalities.   EKG: * ED ECG REPORT I, Jennye Moccasin, the attending physician, personally viewed and interpreted this ECG.  Date: 07/28/2016 EKG Time: 1307 Rate: *90 Rhythm: normal sinus rhythm QRS Axis: normal Intervals: normal ST/T Wave abnormalities: normal Conduction Disturbances: none Narrative Interpretation: unremarkable Poor R-wave progression in  the anterior leads without any ischemic changes   Radiology:  "Dg Chest 2 View  Result Date: 07/28/2016 CLINICAL DATA:  Chest pain for several days. EXAM: CHEST  2 VIEW COMPARISON:  12/04/2015 and prior radiographs FINDINGS: The cardiomediastinal silhouette is unremarkable. There is no evidence of focal airspace disease, pulmonary edema, suspicious pulmonary nodule/mass, pleural effusion, or pneumothorax. No acute bony abnormalities are identified. Remote right rib fracture again noted. IMPRESSION: No active cardiopulmonary disease. Electronically Signed   By: Harmon Pier M.D.   On: 07/28/2016 13:45  " I personally reviewed the radiologic studies   ED Course: * Patient's stay here was uneventful and I felt this was unlikely to be a life-threatening cause for chest pain based on her clinical presentation, past medical history, risk factors etc. I felt  it was unlikely to be acute coronary syndrome especially given the reproducible nature of her discomfort. She has no current pulmonary emboli risk factors. Patient has primary physician and also psychiatric consultation has been advised to touch base with both on Monday. She does have a history of being going through a lot of stress right now which may be exacerbating some of her underlying discomfort. The patient was given a prescription for lidocaine patches and Ativan as we tried to avoid narcotic pain medications on top of what was prescribed by her primary physician. She states that she hasn't upcoming appointment and referral to the pain clinic.  though anxious she states she's not suicidal, homicidal, or having hallucinations   Assessment: * Musculoskeletal chest pain Anxiety   Final Clinical Impression:  Final diagnoses:  Atypical chest pain     Plan:  Outpatient " New Prescriptions   LIDOCAINE (LIDODERM) 5 %    Place 1 patch onto the skin daily.   LORAZEPAM (ATIVAN) 1 MG TABLET    Take 1 tablet (1 mg total) by mouth 2 (two)  times daily as needed for anxiety.  " Patient was advised to return immediately if condition worsens. Patient was advised to follow up with their primary care physician or other specialized physicians involved in their outpatient care. The patient and/or family member/power of attorney had laboratory results reviewed at the bedside. All questions and concerns were addressed and appropriate discharge instructions were distributed by the nursing staff. Jennye Moccasin*            Brian S Quigley, MD 07/28/16 [phone]

## 2016-07-31 ENCOUNTER — Ambulatory Visit: Payer: Self-pay

## 2016-09-03 ENCOUNTER — Other Ambulatory Visit: Payer: Self-pay

## 2016-09-03 ENCOUNTER — Encounter: Payer: Self-pay | Admitting: Gastroenterology

## 2016-09-03 ENCOUNTER — Ambulatory Visit (INDEPENDENT_AMBULATORY_CARE_PROVIDER_SITE_OTHER): Payer: BLUE CROSS/BLUE SHIELD | Admitting: Gastroenterology

## 2016-09-03 VITALS — BP 126/84 | HR 90 | Temp 98.0°F | Ht 61.0 in | Wt 240.8 lb

## 2016-09-03 DIAGNOSIS — K219 Gastro-esophageal reflux disease without esophagitis: Secondary | ICD-10-CM

## 2016-09-03 DIAGNOSIS — R1013 Epigastric pain: Secondary | ICD-10-CM | POA: Diagnosis not present

## 2016-09-03 MED ORDER — OMEPRAZOLE 40 MG PO CPDR: 40.0000 mg | DELAYED_RELEASE_CAPSULE | Freq: Every day | ORAL | 3 refills | Status: DC

## 2016-09-03 NOTE — Progress Notes (Signed)
Gastroenterology Consultation  Referring Provider:     Marguarite Arbour, MD Primary Care Physician:  Marguarite Arbour, MD Primary Gastroenterologist:  Dr. Wyline Mood  Reason for Consultation:     Abdominal pain         HPI:   Virginia Peterson is a 41 y.o. y/o female referred for consultation & management  by Dr. Marguarite Arbour, MD.    She presented to the ER on 07/06/16 with chest discomfort . The pain on that occasion was reproducible , she was under a lot of psychological stress per the note and she was discharged.     Abdominal pain: Onset: few months, not progressed, central , comes on when she eats, usually 15 minutes after meals. Can stay for a day  Site :central  Radiation: localized  Severity :8/10  Nature of pain: like a punch  Aggravating factors: eating ,  Relieving factors :nothing  Weight loss: intentionally losing some weight  NSAID use: was on naprosyn for a long time- stopped it a few months back PPI use :occasional zantac -helps once in a while  Gall bladder surgery: no , grandfather had his gall bladder taken out .  Frequency of bowel movements: every day , soft  Change in bowel movements: no  Relief with bowel movements: no  Gas/Bloating/Abdominal distension: yes- abdominal pain worse during periods, and worse when when bloated. Not sexually active. When she has her periods the pain is severe  No prior endoscopy or colonoscopy.   Previously on Mobic which she has also stopped.   Past Medical History:  Diagnosis Date  . Anxiety   . Asthma   . Depressed   . Diabetes mellitus without complication (HCC)   . Dysthymia 2016  . Hyperlipidemia   . Hypertension     Past Surgical History:  Procedure Laterality Date  . benign neck tumor  2012  . CESAREAN SECTION    . NECK SURGERY      Prior to Admission medications   Medication Sig Start Date End Date Taking? Authorizing Provider  ipratropium-albuterol (DUONEB) 0.5-2.5 (3) MG/3ML SOLN Inhale into the  lungs. 07/20/16 07/15/17 Yes Historical Provider, MD  traMADol (ULTRAM) 50 MG tablet Take by mouth. 07/20/16  Yes Historical Provider, MD  albuterol (PROVENTIL HFA;VENTOLIN HFA) 108 (90 BASE) MCG/ACT inhaler Inhale 2 puffs into the lungs every 6 (six) hours as needed for wheezing or shortness of breath. 04/25/15   Tommi Rumps, PA-C  ALPRAZolam Prudy Feeler) 1 MG tablet Take 1 mg by mouth.    Historical Provider, MD  atorvastatin (LIPITOR) 20 MG tablet TK 1 T PO QD 08/23/16   Historical Provider, MD  carisoprodol (SOMA) 350 MG tablet Take by mouth.    Historical Provider, MD  cefdinir (OMNICEF) 300 MG capsule  08/14/16   Historical Provider, MD  cyclobenzaprine (FLEXERIL) 10 MG tablet Take by mouth.    Historical Provider, MD  escitalopram (LEXAPRO) 10 MG tablet Take by mouth.    Historical Provider, MD  famotidine (PEPCID) 20 MG tablet Take 1 tablet (20 mg total) by mouth 2 (two) times daily. 01/04/16   Emily Filbert, MD  fluticasone (FLONASE) 50 MCG/ACT nasal spray SHAKE LQ AND U 2 SPRAYS IEN QD PRF RHINITIS OR ALLERGIES 08/14/16   Historical Provider, MD  gabapentin (NEURONTIN) 300 MG capsule Take 1 capsule (300 mg total) by mouth 3 (three) times daily. 01/12/16   Diego F Pabon, MD  glipiZIDE (GLUCOTROL) 10 MG tablet Take 1 tablet (10 mg total)  by mouth 2 (two) times daily. 06/13/16 07/13/16  Jene Every, MD  HYDROcodone-acetaminophen (NORCO/VICODIN) 5-325 MG tablet Take 1 tablet by mouth every 6 (six) hours as needed. for pain 08/07/16   Historical Provider, MD  lidocaine (LIDODERM) 5 % Place 1 patch onto the skin daily. 07/28/16   Jennye Moccasin, MD  lisinopril (PRINIVIL,ZESTRIL) 10 MG tablet Take 1 tablet (10 mg total) by mouth daily. 06/13/16 07/13/16  Jene Every, MD  lisinopril (PRINIVIL,ZESTRIL) 10 MG tablet TK 1 T PO  BID. 08/13/16   Historical Provider, MD  LORazepam (ATIVAN) 1 MG tablet Take 1 tablet (1 mg total) by mouth 2 (two) times daily as needed for anxiety. 07/28/16   Jennye Moccasin, MD    meloxicam (MOBIC) 15 MG tablet Take 1 tablet (15 mg total) by mouth daily. 09/21/15   Chinita Pester, FNP  metFORMIN (GLUCOPHAGE) 1000 MG tablet Take 1 tablet (1,000 mg total) by mouth 2 (two) times daily with a meal. 06/13/16 07/13/16  Jene Every, MD  metFORMIN (GLUCOPHAGE) 1000 MG tablet TK 1 T PO  BID 08/13/16   Historical Provider, MD  naproxen (NAPROSYN) 500 MG tablet Take 1 tablet (500 mg total) by mouth 2 (two) times daily with a meal. 05/25/16   Jami L Hagler, PA-C  ondansetron (ZOFRAN) 4 MG tablet Take 1 tablet (4 mg total) by mouth daily as needed. 10/21/15   Myrna Blazer, MD  pravastatin (PRAVACHOL) 10 MG tablet Take 1 tablet (10 mg total) by mouth daily. 03/04/16 03/18/16  Jami L Hagler, PA-C  sucralfate (CARAFATE) 1 g tablet Take 1 tablet (1 g total) by mouth 4 (four) times daily. 01/08/16   Phineas Semen, MD    Family History  Problem Relation Age of Onset  . Diabetes Mother   . Diabetes Father   . Diabetes Maternal Grandfather   . Diabetes Paternal Grandmother      Social History  Substance Use Topics  . Smoking status: Never Smoker  . Smokeless tobacco: Never Used  . Alcohol use No     Comment: occasional    Allergies as of 09/03/2016  . (No Known Allergies)    Review of Systems:    All systems reviewed and negative except where noted in HPI.   Physical Exam:  There were no vitals taken for this visit. No LMP recorded. Psych:  Alert and cooperative. Normal mood and affect. General:   Alert,  Well-developed, well-nourished, pleasant and cooperative in NAD Head:  Normocephalic and atraumatic. Eyes:  Sclera clear, no icterus.   Conjunctiva pink. Ears:  Normal auditory acuity. Nose:  No deformity, discharge, or lesions. Mouth:  No deformity or lesions,oropharynx pink & moist. Neck:  Supple; no masses or thyromegaly. Lungs:  Respirations even and unlabored.  Clear throughout to auscultation.   No wheezes, crackles, or rhonchi. No acute distress. Heart:   Regular rate and rhythm; no murmurs, clicks, rubs, or gallops. Abdomen:  Normal bowel sounds.  No bruits.  Soft, non-tender and non-distended without masses, hepatosplenomegaly or hernias noted.  No guarding or rebound tenderness.    Lymph Nodes:  No significant cervical adenopathy. Psych:  Alert and cooperative. Normal mood and affect.  Imaging Studies: No results found.  Assessment and Plan:   Keyshia Orwick is a 41 y.o. y/o female has been referred for epigastric pain . History of gas,bloating, NSAID use. Differentials are pain related to peptic ulcers vs gas formation from large qty of sugar in diet vs endometriosis (least likely)  Plan   1. Stool h pylori antigen  2. Omeprazole 40 mg once daily  3. If no better in  Weeks will consider EGD/imaging . Her history is also suggestive of pain which is seen in endometriosis and small bowel bacterial overgrowth. Advised to stop sodas, gum , artificial sugars. , hard candy, sweet teas which she consumes in significant qty which can cause a lot of gas and bloating .   Follow up in 6 weeks   Dr Wyline Mood MD

## 2016-09-08 ENCOUNTER — Emergency Department
Admission: EM | Admit: 2016-09-08 | Discharge: 2016-09-08 | Disposition: A | Discharge status: Home / Self Care | Payer: BLUE CROSS/BLUE SHIELD | Attending: Emergency Medicine | Admitting: Emergency Medicine

## 2016-09-08 ENCOUNTER — Encounter: Payer: Self-pay | Admitting: Emergency Medicine

## 2016-09-08 ENCOUNTER — Emergency Department: Payer: BLUE CROSS/BLUE SHIELD

## 2016-09-08 DIAGNOSIS — R609 Edema, unspecified: Secondary | ICD-10-CM | POA: Insufficient documentation

## 2016-09-08 DIAGNOSIS — E785 Hyperlipidemia, unspecified: Secondary | ICD-10-CM | POA: Diagnosis not present

## 2016-09-08 DIAGNOSIS — Z7984 Long term (current) use of oral hypoglycemic drugs: Secondary | ICD-10-CM | POA: Diagnosis not present

## 2016-09-08 DIAGNOSIS — I10 Essential (primary) hypertension: Secondary | ICD-10-CM | POA: Insufficient documentation

## 2016-09-08 DIAGNOSIS — E119 Type 2 diabetes mellitus without complications: Secondary | ICD-10-CM | POA: Diagnosis not present

## 2016-09-08 DIAGNOSIS — F418 Other specified anxiety disorders: Secondary | ICD-10-CM | POA: Diagnosis not present

## 2016-09-08 DIAGNOSIS — J45909 Unspecified asthma, uncomplicated: Secondary | ICD-10-CM | POA: Insufficient documentation

## 2016-09-08 DIAGNOSIS — R600 Localized edema: Secondary | ICD-10-CM

## 2016-09-08 DIAGNOSIS — R599 Enlarged lymph nodes, unspecified: Secondary | ICD-10-CM | POA: Diagnosis not present

## 2016-09-08 DIAGNOSIS — R197 Diarrhea, unspecified: Secondary | ICD-10-CM | POA: Insufficient documentation

## 2016-09-08 DIAGNOSIS — R590 Localized enlarged lymph nodes: Secondary | ICD-10-CM

## 2016-09-08 DIAGNOSIS — Z79899 Other long term (current) drug therapy: Secondary | ICD-10-CM | POA: Insufficient documentation

## 2016-09-08 LAB — URINALYSIS, COMPLETE (UACMP) WITH MICROSCOPIC
Bacteria, UA: NONE SEEN
Bilirubin Urine: NEGATIVE
GLUCOSE, UA: NEGATIVE mg/dL
HGB URINE DIPSTICK: NEGATIVE
Ketones, ur: NEGATIVE mg/dL
Leukocytes, UA: NEGATIVE
Nitrite: NEGATIVE
PH: 5 (ref 5.0–8.0)
PROTEIN: 100 mg/dL — AB
RBC / HPF: NONE SEEN RBC/hpf (ref 0–5)
Specific Gravity, Urine: 1.024 (ref 1.005–1.030)

## 2016-09-08 MED ORDER — METHYLPREDNISOLONE SODIUM SUCC 40 MG IJ SOLR
40.0000 mg | Freq: Once | INTRAMUSCULAR | Status: AC
  Administered 2016-09-08: 40 mg via INTRAVENOUS
  Filled 2016-09-08: qty 1

## 2016-09-08 MED ORDER — DIPHENOXYLATE-ATROPINE 2.5-0.025 MG PO TABS
2.0000 | ORAL_TABLET | Freq: Once | ORAL | Status: AC
  Administered 2016-09-08: 2 via ORAL
  Filled 2016-09-08: qty 2

## 2016-09-08 MED ORDER — OXYCODONE-ACETAMINOPHEN 5-325 MG PO TABS
1.0000 | ORAL_TABLET | Freq: Once | ORAL | Status: AC
  Administered 2016-09-08: 1 via ORAL
  Filled 2016-09-08: qty 1

## 2016-09-08 MED ORDER — OXYCODONE-ACETAMINOPHEN 5-325 MG PO TABS: 1.0000 | ORAL_TABLET | Freq: Four times a day (QID) | ORAL | 0 refills | Status: DC | PRN

## 2016-09-08 MED ORDER — SODIUM CHLORIDE 0.9 % IV BOLUS (SEPSIS)
1000.0000 mL | Freq: Once | INTRAVENOUS | Status: AC
  Administered 2016-09-08: 1000 mL via INTRAVENOUS

## 2016-09-08 NOTE — Discharge Instructions (Signed)
Advised to finish his last prescription of antibiotics as directed. Follow-up with family doctor if no improvement in 48 hours.

## 2016-09-08 NOTE — ED Triage Notes (Signed)
States 3 weeks ago while traveling noted L submandibular swelling and pain. Began on R side 2 weeks ago. Denies sore throat, fevers. Denies sudden increase in pain with sour foods. Denies trouble swallowing.

## 2016-09-08 NOTE — ED Provider Notes (Signed)
[facility] Emergency Department Provider Note   ____________________________________________   None    (approximate)  I have reviewed the triage vital signs and the nursing notes.   HISTORY  Chief Complaint Facial Swelling    HPI Virginia Peterson is a 41 y.o. female patient complaining of 2 weeks of right and left submandible any edema. Patient states she does not have sore throat or fever. Patient called her PCP because she was traveling as she'll prescribe antibiotics. Patient state first round advised to work and so she called back a second round of antibiotics over-the-counter medication was given. He states still no relief from pain and swelling. Patient denies dysphagia. Patient also complaining of possible dehydration secondary to diarrhea for 2 days.Patient rates the pain as a 10 over 10. No other palliative measures for her complaint.   Past Medical History:  Diagnosis Date  . Anxiety   . Asthma   . Depressed   . Diabetes mellitus without complication (HCC)   . Dysthymia 2016  . Hyperlipidemia   . Hypertension     Patient Active Problem List   Diagnosis Date Noted  . Asthma without status asthmaticus 07/20/2016  . Diabetes mellitus type 2, uncomplicated (HCC) 07/20/2016  . Disc disease, degenerative, lumbar or lumbosacral 07/20/2016  . Generalized anxiety disorder 07/20/2016  . Hyperlipidemia, unspecified 07/20/2016  . Hypertension 07/20/2016  . Pain syndrome, chronic 07/20/2016  . Depression 10/06/2015  . Adjustment disorder with mixed anxiety and depressed mood 10/06/2015    Past Surgical History:  Procedure Laterality Date  . benign neck tumor  2012  . CESAREAN SECTION    . NECK SURGERY      Prior to Admission medications   Medication Sig Start Date End Date Taking? Authorizing Provider  albuterol (PROVENTIL HFA;VENTOLIN HFA) 108 (90 BASE) MCG/ACT inhaler Inhale 2 puffs into the lungs every 6 (six) hours as needed for wheezing  or shortness of breath. 04/25/15   Tommi Rumps, PA-C  ALPRAZolam Prudy Feeler) 1 MG tablet Take 1 mg by mouth.    Historical Provider, MD  atorvastatin (LIPITOR) 20 MG tablet TK 1 T PO QD 08/23/16   Historical Provider, MD  carisoprodol (SOMA) 350 MG tablet Take by mouth.    Historical Provider, MD  cefdinir (OMNICEF) 300 MG capsule  08/14/16   Historical Provider, MD  cyclobenzaprine (FLEXERIL) 10 MG tablet Take by mouth.    Historical Provider, MD  escitalopram (LEXAPRO) 10 MG tablet Take by mouth.    Historical Provider, MD  famotidine (PEPCID) 20 MG tablet Take 1 tablet (20 mg total) by mouth 2 (two) times daily. Patient not taking: Reported on 09/03/2016 01/04/16   Emily Filbert, MD  fluticasone [facility]) 50 MCG/ACT nasal spray SHAKE LQ AND U 2 SPRAYS IEN QD PRF RHINITIS OR ALLERGIES 08/14/16   Historical Provider, MD  gabapentin (NEURONTIN) 300 MG capsule Take 1 capsule (300 mg total) by mouth 3 (three) times daily. Patient not taking: Reported on 09/03/2016 01/12/16   Diego F Pabon, MD  glipiZIDE (GLUCOTROL) 10 MG tablet Take 1 tablet (10 mg total) by mouth 2 (two) times daily. 06/13/16 07/13/16  Jene Every, MD  HYDROcodone-acetaminophen (NORCO/VICODIN) 5-325 MG tablet Take 1 tablet by mouth every 6 (six) hours as needed. for pain 08/07/16   Historical Provider, MD  ipratropium-albuterol (DUONEB) 0.5-2.5 (3) MG/3ML SOLN Inhale into the lungs. 07/20/16 07/15/17  Historical Provider, MD  lidocaine (LIDODERM) 5 % Place 1 patch onto the skin daily. Patient not taking:  Reported on 09/03/2016 07/28/16   Jennye Moccasin, MD  lisinopril (PRINIVIL,ZESTRIL) 10 MG tablet Take 1 tablet (10 mg total) by mouth daily. 06/13/16 07/13/16  Jene Every, MD  lisinopril (PRINIVIL,ZESTRIL) 10 MG tablet TK 1 T PO  BID. 08/13/16   Historical Provider, MD  LORazepam (ATIVAN) 1 MG tablet Take 1 tablet (1 mg total) by mouth 2 (two) times daily as needed for anxiety. 07/28/16   Jennye Moccasin, MD  meloxicam (MOBIC) 15 MG tablet  Take 1 tablet (15 mg total) by mouth daily. Patient not taking: Reported on 09/03/2016 09/21/15   Chinita Pester, FNP  metFORMIN (GLUCOPHAGE) 1000 MG tablet Take 1 tablet (1,000 mg total) by mouth 2 (two) times daily with a meal. 06/13/16 07/13/16  Jene Every, MD  metFORMIN (GLUCOPHAGE) 1000 MG tablet TK 1 T PO  BID 08/13/16   Historical Provider, MD  naproxen (NAPROSYN) 500 MG tablet Take 1 tablet (500 mg total) by mouth 2 (two) times daily with a meal. Patient not taking: Reported on 09/03/2016 05/25/16   Jami L Hagler, PA-C  omeprazole (PRILOSEC) 40 MG capsule Take 1 capsule (40 mg total) by mouth daily. 09/03/16   Wyline Mood, MD  ondansetron (ZOFRAN) 4 MG tablet Take 1 tablet (4 mg total) by mouth daily as needed. Patient not taking: Reported on 09/03/2016 10/21/15   Myrna Blazer, MD  oxyCODONE-acetaminophen (ROXICET) 5-325 MG tablet Take 1 tablet by mouth every 6 (six) hours as needed for moderate pain. 09/08/16   Joni Reining, PA-C  pravastatin (PRAVACHOL) 10 MG tablet Take 1 tablet (10 mg total) by mouth daily. 03/04/16 03/18/16  Jami L Hagler, PA-C  sucralfate (CARAFATE) 1 g tablet Take 1 tablet (1 g total) by mouth 4 (four) times daily. Patient not taking: Reported on 09/03/2016 01/08/16   Phineas Semen, MD  traMADol (ULTRAM) 50 MG tablet Take by mouth. 07/20/16   Historical Provider, MD    Allergies Tramadol  Family History  Problem Relation Age of Onset  . Diabetes Mother   . Diabetes Father   . Diabetes Maternal Grandfather   . Diabetes Paternal Grandmother     Social History Social History  Substance Use Topics  . Smoking status: Never Smoker  . Smokeless tobacco: Never Used  . Alcohol use No     Comment: occasional    Review of Systems Constitutional: No fever/chills Eyes: No visual changes. ENT: No sore throat. Cardiovascular: Denies chest pain. Respiratory: Denies shortness of breath. Gastrointestinal: No abdominal pain.  No nausea, no vomiting.  No diarrhea.   No constipation. Genitourinary: Negative for dysuria. Musculoskeletal: Negative for back pain. Skin: Negative for rash. Neurological: Negative for headaches, focal weakness or numbness. Psychiatric:Anxiety and depression Endocrine:Diabetes, hypertension, and hyperlipidemia. Hematological/Lymphatic: Allergic/Immunilogical: Tramadol ___________________________________________   PHYSICAL EXAM:  VITAL SIGNS: ED Triage Vitals  Enc Vitals Group     BP 09/08/16 0929 (!) 148/81     Pulse Rate 09/08/16 0929 93     Resp 09/08/16 0929 18     Temp 09/08/16 0929 98.3 F (36.8 C)     Temp Source 09/08/16 0929 Oral     SpO2 09/08/16 0929 97 %     Weight 09/08/16 0930 240 lb (108.9 kg)     Height 09/08/16 0930  (1.626 m)     Head Circumference --      Peak Flow --      Pain Score 09/08/16 0929 10     Pain Loc --  Pain Edu? --      Excl. in GC? --     Constitutional: Alert and oriented. Well appearing and in no acute distress. Eyes: Conjunctivae are normal. PERRL. EOMI. Head: Atraumatic. Nose: No congestion/rhinnorhea. Mouth/Throat: Mucous membranes are dry. Oropharynx non-erythematous. Neck: No stridor.  No cervical spine tenderness to palpation. Hematological/Lymphatic/Immunilogical: Right submandibular lymph node.  Cardiovascular: Normal rate, regular rhythm. Grossly normal heart sounds.  Good peripheral circulation. Respiratory: Normal respiratory effort.  No retractions. Lungs CTAB. Gastrointestinal: Soft and nontender. No distention. No abdominal bruits. No CVA tenderness. Musculoskeletal: No lower extremity tenderness nor edema.  No joint effusions. Neurologic:  Normal speech and language. No gross focal neurologic deficits are appreciated. No gait instability. Skin:  Skin is warm, dry and intact. No rash noted. Psychiatric: Mood and affect are normal. Speech and behavior are normal.  ____________________________________________   LABS (all labs ordered are  listed, but only abnormal results are displayed)  Labs Reviewed  URINALYSIS, COMPLETE (UACMP) WITH MICROSCOPIC - Abnormal; Notable for the following:       Result Value   Color, Urine YELLOW (*)    APPearance CLOUDY (*)    Protein, ur 100 (*)    Squamous Epithelial / LPF 6-30 (*)    All other components within normal limits   ____________________________________________  EKG   ____________________________________________  RADIOLOGY  Benign bilateral benign lymph nodes ____________________________________________   PROCEDURES  Procedure(s) performed: None  Procedures  Critical Care performed: No  ____________________________________________   INITIAL IMPRESSION / ASSESSMENT AND PLAN / ED COURSE  Pertinent labs & imaging results that were available during my care of the patient were reviewed by me and considered in my medical decision making (see chart for details).   Right submandibular pain.  Ultrasound pending. Diarrhea secondary to back to back antibiotics.   ____________________________________________   FINAL CLINICAL IMPRESSION(S) / ED DIAGNOSES  Final diagnoses:  Edema of face  Submental adenopathy  Diarrhea in adult patient      NEW MEDICATIONS STARTED DURING THIS VISIT:  New Prescriptions   OXYCODONE-ACETAMINOPHEN (ROXICET) 5-325 MG TABLET    Take 1 tablet by mouth every 6 (six) hours as needed for moderate pain.     Note:  This document was prepared using Dragon voice recognition software and may include unintentional dictation errors.    Joni Reining, PA-C 09/08/16 1139    Minna Antis, MD 09/08/16 1246

## 2016-09-08 NOTE — ED Notes (Signed)
Pt requested a w/c out

## 2016-09-08 NOTE — ED Notes (Addendum)
Pt back from [place] and iv inserted, fluids running and pt medicated with lomotil. Pt states was on amox but was only taking 2x a day instead of 3x a day - called and was put on a different antibiotic cefdnir and now has diarrhea. States the antibiotic was for a lump on the right side of face and was never evaluated by pcp.

## 2016-09-14 ENCOUNTER — Other Ambulatory Visit
Admission: RE | Admit: 2016-09-14 | Discharge: 2016-09-14 | Disposition: A | Discharge status: Home / Self Care | Payer: BLUE CROSS/BLUE SHIELD | Source: Ambulatory Visit | Attending: Gastroenterology | Admitting: Gastroenterology

## 2016-09-14 DIAGNOSIS — R1013 Epigastric pain: Secondary | ICD-10-CM | POA: Insufficient documentation

## 2016-09-16 LAB — H. PYLORI ANTIGEN, STOOL: H. Pylori Stool Ag, Eia: NEGATIVE

## 2016-09-17 ENCOUNTER — Other Ambulatory Visit: Payer: Self-pay | Admitting: Physical Medicine and Rehabilitation

## 2016-09-17 DIAGNOSIS — M5416 Radiculopathy, lumbar region: Secondary | ICD-10-CM

## 2016-09-19 ENCOUNTER — Other Ambulatory Visit: Payer: Self-pay

## 2016-09-22 ENCOUNTER — Emergency Department
Admission: EM | Admit: 2016-09-22 | Discharge: 2016-09-22 | Disposition: A | Discharge status: Home / Self Care | Payer: BLUE CROSS/BLUE SHIELD | Attending: Emergency Medicine | Admitting: Emergency Medicine

## 2016-09-22 ENCOUNTER — Emergency Department: Payer: BLUE CROSS/BLUE SHIELD

## 2016-09-22 ENCOUNTER — Encounter: Payer: Self-pay | Admitting: Emergency Medicine

## 2016-09-22 DIAGNOSIS — E119 Type 2 diabetes mellitus without complications: Secondary | ICD-10-CM | POA: Diagnosis not present

## 2016-09-22 DIAGNOSIS — K1120 Sialoadenitis, unspecified: Secondary | ICD-10-CM | POA: Insufficient documentation

## 2016-09-22 DIAGNOSIS — Z79899 Other long term (current) drug therapy: Secondary | ICD-10-CM | POA: Insufficient documentation

## 2016-09-22 DIAGNOSIS — R220 Localized swelling, mass and lump, head: Secondary | ICD-10-CM | POA: Diagnosis present

## 2016-09-22 DIAGNOSIS — J45909 Unspecified asthma, uncomplicated: Secondary | ICD-10-CM | POA: Diagnosis not present

## 2016-09-22 DIAGNOSIS — Z7984 Long term (current) use of oral hypoglycemic drugs: Secondary | ICD-10-CM | POA: Insufficient documentation

## 2016-09-22 DIAGNOSIS — I10 Essential (primary) hypertension: Secondary | ICD-10-CM | POA: Diagnosis not present

## 2016-09-22 LAB — POCT PREGNANCY, URINE: PREG TEST UR: NEGATIVE

## 2016-09-22 MED ORDER — IOPAMIDOL (ISOVUE-300) INJECTION 61%
75.0000 mL | Freq: Once | INTRAVENOUS | Status: AC | PRN
  Administered 2016-09-22: 75 mL via INTRAVENOUS
  Filled 2016-09-22: qty 75

## 2016-09-22 MED ORDER — PREDNISONE 10 MG PO TABS: 40.0000 mg | ORAL_TABLET | Freq: Every day | ORAL | 0 refills | Status: AC

## 2016-09-22 MED ORDER — CLINDAMYCIN HCL 150 MG PO CAPS
300.0000 mg | ORAL_CAPSULE | Freq: Once | ORAL | Status: AC
  Administered 2016-09-22: 300 mg via ORAL
  Filled 2016-09-22: qty 2

## 2016-09-22 MED ORDER — CLINDAMYCIN HCL 300 MG PO CAPS: 300.0000 mg | ORAL_CAPSULE | Freq: Four times a day (QID) | ORAL | 0 refills | Status: AC

## 2016-09-22 MED ORDER — PREDNISONE 20 MG PO TABS
60.0000 mg | ORAL_TABLET | Freq: Once | ORAL | Status: AC
  Administered 2016-09-22: 60 mg via ORAL
  Filled 2016-09-22: qty 3

## 2016-09-22 NOTE — ED Triage Notes (Signed)
Pt to ed with c/o right sided submandibular pain that has been going on for several weeks.  Pt states she has appt with ENT in May, but pain and swelling continues.  Pt rates pain 10/10 at this time.

## 2016-09-22 NOTE — ED Provider Notes (Signed)
[facility] Emergency Department Provider Note  ____________________________________________  Time seen: Approximately 10:29 AM  I have reviewed the triage vital signs and the nursing notes.   HISTORY  Chief Complaint Facial Swelling    HPI Virginia Peterson is a 41 y.o. female that presents to the emergency department with right jaw and neck pain for one month. She states that she can feel a lump in her jaw. Jaw is tender to touch. She states that she was seen in the emergency department 2 weeks ago and had an ultrasound and symptoms have only gotten worse. She has an appointment with ENT on May 7 but cannot wait that long.She is not having any difficulty opening or closing mouth. Eating does not make pain better or worse. She does not have any cavities that she knows about and saw a dentist last one year ago. She had a benign tumor removed on the back of her neck. She received all of her shots as a child. She denies fever, drainage from, mouth, shortness of breath, chest pain, nausea, vomiting, abdominal pain.   Past Medical History:  Diagnosis Date  . Anxiety   . Asthma   . Depressed   . Diabetes mellitus without complication (HCC)   . Dysthymia 2016  . Hyperlipidemia   . Hypertension     Patient Active Problem List   Diagnosis Date Noted  . Asthma without status asthmaticus 07/20/2016  . Diabetes mellitus type 2, uncomplicated (HCC) 07/20/2016  . Disc disease, degenerative, lumbar or lumbosacral 07/20/2016  . Generalized anxiety disorder 07/20/2016  . Hyperlipidemia, unspecified 07/20/2016  . Hypertension 07/20/2016  . Pain syndrome, chronic 07/20/2016  . Depression 10/06/2015  . Adjustment disorder with mixed anxiety and depressed mood 10/06/2015    Past Surgical History:  Procedure Laterality Date  . benign neck tumor  2012  . CESAREAN SECTION    . NECK SURGERY      Prior to Admission medications   Medication Sig Start Date End Date Taking?  Authorizing Provider  albuterol (PROVENTIL HFA;VENTOLIN HFA) 108 (90 BASE) MCG/ACT inhaler Inhale 2 puffs into the lungs every 6 (six) hours as needed for wheezing or shortness of breath. 04/25/15   Tommi Rumps, PA-C  ALPRAZolam Prudy Feeler) 1 MG tablet Take 1 mg by mouth.    Historical Provider, MD  atorvastatin (LIPITOR) 20 MG tablet TK 1 T PO QD 08/23/16   Historical Provider, MD  carisoprodol (SOMA) 350 MG tablet Take by mouth.    Historical Provider, MD  cefdinir (OMNICEF) 300 MG capsule  08/14/16   Historical Provider, MD  clindamycin (CLEOCIN) 300 MG capsule Take 1 capsule (300 mg total) by mouth 4 (four) times daily. 09/22/16 10/02/16  Enid Derry, PA-C  cyclobenzaprine (FLEXERIL) 10 MG tablet Take by mouth.    Historical Provider, MD  escitalopram (LEXAPRO) 10 MG tablet Take by mouth.    Historical Provider, MD  famotidine (PEPCID) 20 MG tablet Take 1 tablet (20 mg total) by mouth 2 (two) times daily. Patient not taking: Reported on 09/03/2016 01/04/16   Emily Filbert, MD  fluticasone [facility]) 50 MCG/ACT nasal spray SHAKE LQ AND U 2 SPRAYS IEN QD PRF RHINITIS OR ALLERGIES 08/14/16   Historical Provider, MD  gabapentin (NEURONTIN) 300 MG capsule Take 1 capsule (300 mg total) by mouth 3 (three) times daily. Patient not taking: Reported on 09/03/2016 01/12/16   Diego F Pabon, MD  glipiZIDE (GLUCOTROL) 10 MG tablet Take 1 tablet (10 mg total) by mouth 2 (  two) times daily. 06/13/16 07/13/16  Jene Every, MD  HYDROcodone-acetaminophen (NORCO/VICODIN) 5-325 MG tablet Take 1 tablet by mouth every 6 (six) hours as needed. for pain 08/07/16   Historical Provider, MD  ipratropium-albuterol (DUONEB) 0.5-2.5 (3) MG/3ML SOLN Inhale into the lungs. 07/20/16 07/15/17  Historical Provider, MD  lidocaine (LIDODERM) 5 % Place 1 patch onto the skin daily. Patient not taking: Reported on 09/03/2016 07/28/16   Jennye Moccasin, MD  lisinopril (PRINIVIL,ZESTRIL) 10 MG tablet Take 1 tablet (10 mg total) by mouth daily.  06/13/16 07/13/16  Jene Every, MD  lisinopril (PRINIVIL,ZESTRIL) 10 MG tablet TK 1 T PO  BID. 08/13/16   Historical Provider, MD  LORazepam (ATIVAN) 1 MG tablet Take 1 tablet (1 mg total) by mouth 2 (two) times daily as needed for anxiety. 07/28/16   Jennye Moccasin, MD  meloxicam (MOBIC) 15 MG tablet Take 1 tablet (15 mg total) by mouth daily. Patient not taking: Reported on 09/03/2016 09/21/15   Chinita Pester, FNP  metFORMIN (GLUCOPHAGE) 1000 MG tablet Take 1 tablet (1,000 mg total) by mouth 2 (two) times daily with a meal. 06/13/16 07/13/16  Jene Every, MD  metFORMIN (GLUCOPHAGE) 1000 MG tablet TK 1 T PO  BID 08/13/16   Historical Provider, MD  naproxen (NAPROSYN) 500 MG tablet Take 1 tablet (500 mg total) by mouth 2 (two) times daily with a meal. Patient not taking: Reported on 09/03/2016 05/25/16   Jami L Hagler, PA-C  omeprazole (PRILOSEC) 40 MG capsule Take 1 capsule (40 mg total) by mouth daily. 09/03/16   Wyline Mood, MD  ondansetron (ZOFRAN) 4 MG tablet Take 1 tablet (4 mg total) by mouth daily as needed. Patient not taking: Reported on 09/03/2016 10/21/15   Myrna Blazer, MD  oxyCODONE-acetaminophen (ROXICET) 5-325 MG tablet Take 1 tablet by mouth every 6 (six) hours as needed for moderate pain. 09/08/16   Joni Reining, PA-C  pravastatin (PRAVACHOL) 10 MG tablet Take 1 tablet (10 mg total) by mouth daily. 03/04/16 03/18/16  Jami L Hagler, PA-C  predniSONE (DELTASONE) 10 MG tablet Take 4 tablets (40 mg total) by mouth daily. 09/22/16 09/27/16  Enid Derry, PA-C  sucralfate (CARAFATE) 1 g tablet Take 1 tablet (1 g total) by mouth 4 (four) times daily. Patient not taking: Reported on 09/03/2016 01/08/16   Phineas Semen, MD  traMADol (ULTRAM) 50 MG tablet Take by mouth. 07/20/16   Historical Provider, MD    Allergies Tramadol  Family History  Problem Relation Age of Onset  . Diabetes Mother   . Diabetes Father   . Diabetes Maternal Grandfather   . Diabetes Paternal Grandmother      Social History Social History  Substance Use Topics  . Smoking status: Never Smoker  . Smokeless tobacco: Never Used  . Alcohol use No     Comment: occasional     Review of Systems  Constitutional: No fever/chills ENT: No upper respiratory complaints. Cardiovascular: No chest pain. Respiratory: No cough. No SOB. Gastrointestinal: No abdominal pain.  No nausea, no vomiting.  Skin: Negative for rash, abrasions, lacerations, ecchymosis. Neurological: Negative for headaches, numbness or tingling   ____________________________________________   PHYSICAL EXAM:  VITAL SIGNS: ED Triage Vitals [09/22/16 0853]  Enc Vitals Group     BP (!) 152/61     Pulse Rate 80     Resp 20     Temp 98.7 F (37.1 C)     Temp Source Oral     SpO2 100 %  Weight 240 lb (108.9 kg)     Height      Head Circumference      Peak Flow      Pain Score 10     Pain Loc      Pain Edu?      Excl. in GC?      Constitutional: Alert and oriented. Well appearing and in no acute distress. Eyes: Conjunctivae are normal. PERRL. EOMI. Head: Atraumatic. ENT:      Ears: Tympanic membranes pearly gray with good landmarks.      Nose: No congestion/rhinnorhea.      Mouth/Throat: Mucous membranes are moist. Good dentition. Tonsils nonenlarged. Tender to palpation over lower right jaw. Tender to palpation over cervical lymph nodes on the right side. Neck: No stridor.   Cardiovascular: Normal rate, regular rhythm.  Good peripheral circulation. Respiratory: Normal respiratory effort without tachypnea or retractions. Lungs CTAB. Good air entry to the bases with no decreased or absent breath sounds. Musculoskeletal: Full range of motion to all extremities. No gross deformities appreciated. Neurologic:  Normal speech and language. No gross focal neurologic deficits are appreciated.  Skin:  Skin is warm, dry and intact. No rash noted.  ____________________________________________   LABS (all labs ordered  are listed, but only abnormal results are displayed)  Labs Reviewed  POC URINE PREG, ED  POCT PREGNANCY, URINE   ____________________________________________  EKG   ____________________________________________  RADIOLOGY   Ct Soft Tissue Neck W Contrast  Result Date: 09/22/2016 CLINICAL DATA:  Right submandibular pain for several weeks. EXAM: CT NECK WITH CONTRAST TECHNIQUE: Multidetector CT imaging of the neck was performed using the standard protocol following the bolus administration of intravenous contrast. CONTRAST:  75mL ISOVUE-300 IOPAMIDOL (ISOVUE-300) INJECTION 61% COMPARISON:  Ultrasound 09/08/2016 FINDINGS: Pharynx and larynx: No mass. No parapharyngeal/ retropharyngeal inflammatory change or fluid collection. Unremarkable larynx. Salivary glands: The submandibular glands are unremarkable, and no significant submandibular space inflammation is seen. There is mild asymmetric hyperenhancement of the right parotid gland, predominantly inferiorly, with mild inflammatory stranding extending inferiorly from the parotid tail and with mild associated thickening of the right platysma. Asymmetric anterior extension of right parotid tissue superficial to the masseter muscle is a normal variant. There is a subcentimeter focus of hyperenhancement inferiorly in the left parotid gland with possible minimal adjacent stranding. No salivary calculi are identified. Thyroid: Unremarkable. Lymph nodes: No enlarged lymph nodes are identified in the neck. Vascular: Unremarkable. Limited intracranial: Unremarkable. Visualized orbits: Unremarkable. Mastoids and visualized paranasal sinuses: Mild mucosal thickening in the left greater than right ethmoid and bilateral maxillary sinuses. No sinus fluid levels. Clear mastoid air cells. Skeleton: No acute osseous abnormality or suspicious osseous lesion. An unerupted supernumerary tooth is noted in the right maxilla between the lateral incisor and canine. Upper  chest: Unremarkable. Other: None. IMPRESSION: Mild inflammatory changes about the inferior right parotid gland compatible with parotitis. Possible minimal left parotid tail inflammation. No calculi or mass. Electronically Signed   By: Sebastian Ache M.D.   On: 09/22/2016 12:07    ____________________________________________    PROCEDURES  Procedure(s) performed:    Procedures    Medications  iopamidol (ISOVUE-300) 61 % injection 75 mL (75 mLs Intravenous Contrast Given 09/22/16 1129)  predniSONE (DELTASONE) tablet 60 mg (60 mg Oral Given 09/22/16 1247)  clindamycin (CLEOCIN) capsule 300 mg (300 mg Oral Given 09/22/16 1247)     ____________________________________________   INITIAL IMPRESSION / ASSESSMENT AND PLAN / ED COURSE  Pertinent labs & imaging results that  were available during my care of the patient were reviewed by me and considered in my medical decision making (see chart for details).  Review of the [place] CSRS was performed in accordance of the [facility] prior to dispensing any controlled drugs.     Patient's diagnosis is consistent with parotitis. Vital signs and exam are reassuring. CT consistent with mild parotitis. Patient will be discharged home with prescriptions for clindamycin and prednisone. Patient is to follow up with ENT as directed. Patient is given ED precautions to return to the ED for any worsening or new symptoms.     ____________________________________________  FINAL CLINICAL IMPRESSION(S) / ED DIAGNOSES  Final diagnoses:  Parotitis      NEW MEDICATIONS STARTED DURING THIS VISIT:  Discharge Medication List as of 09/22/2016 12:37 PM    START taking these medications   Details  clindamycin (CLEOCIN) 300 MG capsule Take 1 capsule (300 mg total) by mouth 4 (four) times daily., Starting Sat 09/22/2016, Until Tue 10/02/2016, Print    predniSONE (DELTASONE) 10 MG tablet Take 4 tablets (40 mg total) by mouth daily., Starting Sat 09/22/2016, Until Thu  09/27/2016, Print            This chart was dictated using voice recognition software/Dragon. Despite best efforts to proofread, errors can occur which can change the meaning. Any change was purely unintentional.    Enid Derry, PA-C 09/22/16 1533    Jene Every, MD 09/24/16 [phone]

## 2016-09-26 ENCOUNTER — Telehealth: Payer: Self-pay

## 2016-09-26 ENCOUNTER — Other Ambulatory Visit: Payer: Self-pay

## 2016-09-26 ENCOUNTER — Telehealth: Payer: Self-pay | Admitting: Gastroenterology

## 2016-09-26 DIAGNOSIS — K1120 Sialoadenitis, unspecified: Secondary | ICD-10-CM

## 2016-09-26 NOTE — Telephone Encounter (Signed)
LVM for patient callback to schedule EGD per Dr. Tobi Bastos.   Also H Pylori = negative.

## 2016-09-26 NOTE — Telephone Encounter (Signed)
09/26/16 Spk with Dyane Dustman at [facility] and  NO prior auth required for EGD 40981 / K11.20

## 2016-09-27 ENCOUNTER — Ambulatory Visit
Admission: RE | Admit: 2016-09-27 | Discharge: 2016-09-27 | Disposition: A | Discharge status: Home / Self Care | Payer: BLUE CROSS/BLUE SHIELD | Source: Ambulatory Visit | Attending: Physical Medicine and Rehabilitation | Admitting: Physical Medicine and Rehabilitation

## 2016-09-27 DIAGNOSIS — M5137 Other intervertebral disc degeneration, lumbosacral region: Secondary | ICD-10-CM | POA: Diagnosis not present

## 2016-09-27 DIAGNOSIS — M5416 Radiculopathy, lumbar region: Secondary | ICD-10-CM

## 2016-09-27 DIAGNOSIS — E882 Lipomatosis, not elsewhere classified: Secondary | ICD-10-CM | POA: Diagnosis not present

## 2016-09-28 IMAGING — CT CT ABD-PELV W/ CM
2 of 4 series · 16 of 46 positions shown, 18 images · IV contrast (APPLIED)
Comparison: [DATE]

CLINICAL DATA: Right upper quadrant pain

EXAM:
CT ABDOMEN AND PELVIS WITH CONTRAST
TECHNIQUE: Multidetector CT imaging of the abdomen and pelvis was performed
using the standard protocol following bolus administration of
intravenous contrast.
CONTRAST:  100mL 5ABA90-CRR IOPAMIDOL (5ABA90-CRR) INJECTION 61%

[Series 2: axial st · axial · 0.91mm/px · z∈[-236,+219]mm · 13 of 101 slices shown, 15 images]
[im 5/101  soft-tissue]
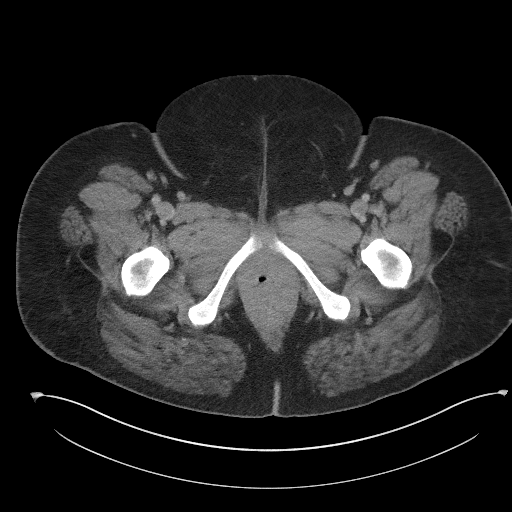
[im 5/101  bone]
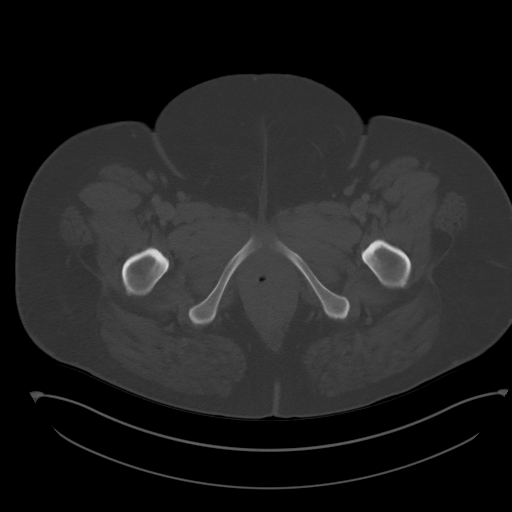
[im 14/101  soft-tissue]
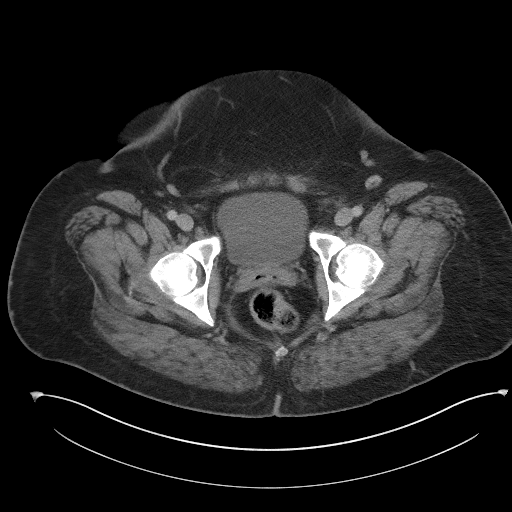
[im 22/101  soft-tissue]
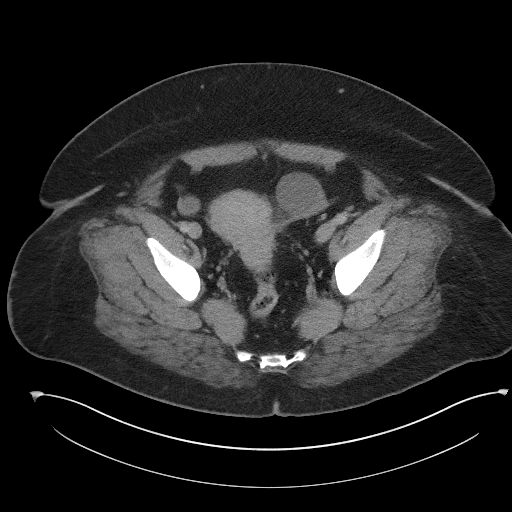
[im 27/101  soft-tissue]
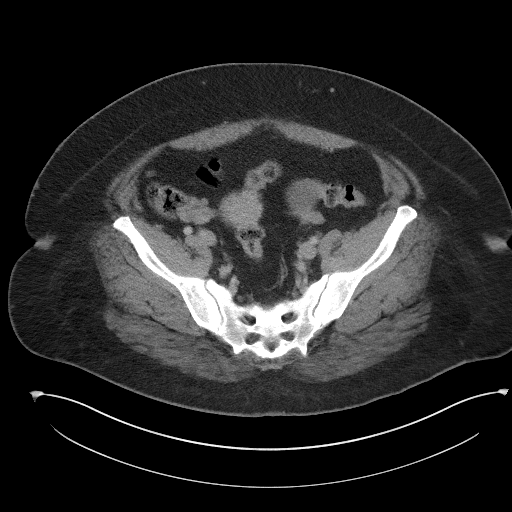
[im 35/101  soft-tissue]
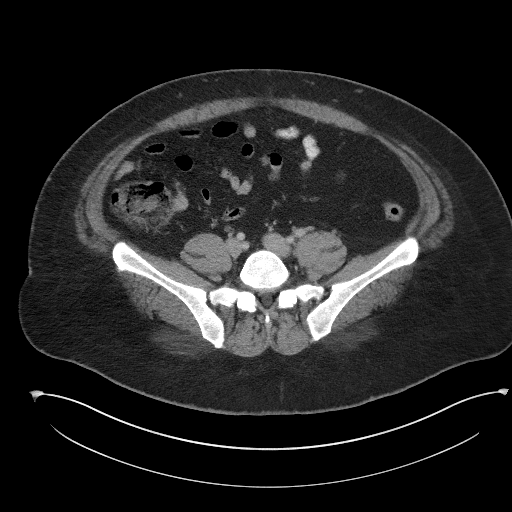
[im 44/101  soft-tissue]
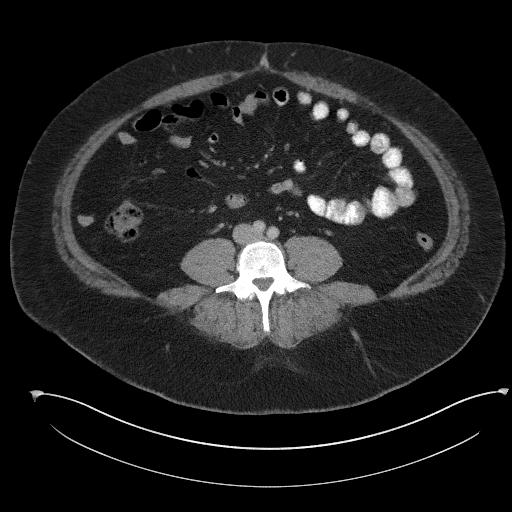
[im 53/101  soft-tissue]
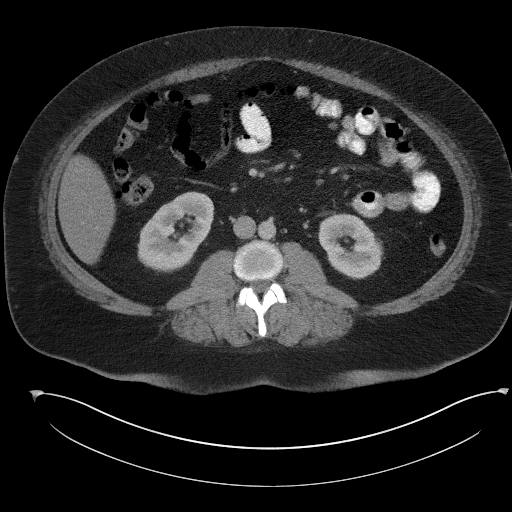
[im 57/101  soft-tissue]
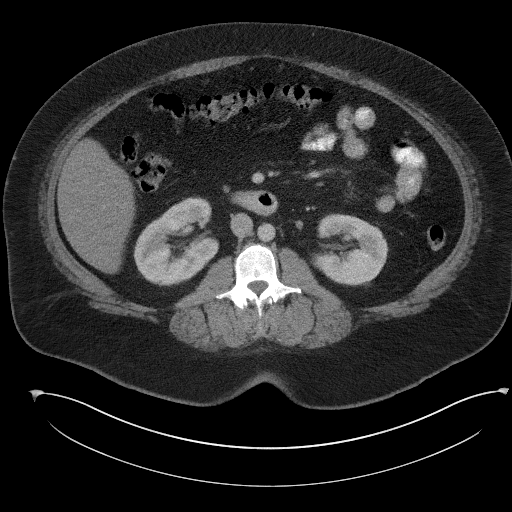
[im 66/101  soft-tissue]
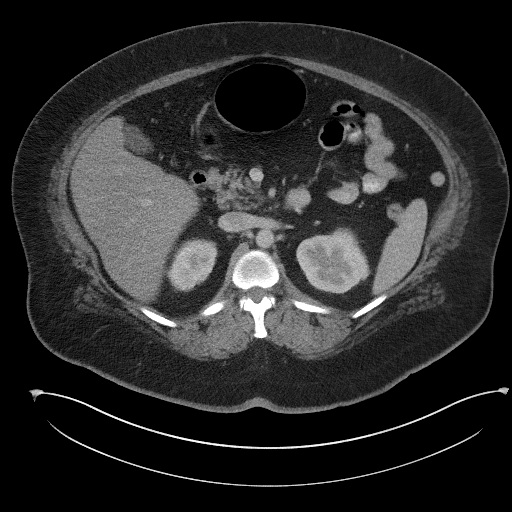
[im 66/101  bone]
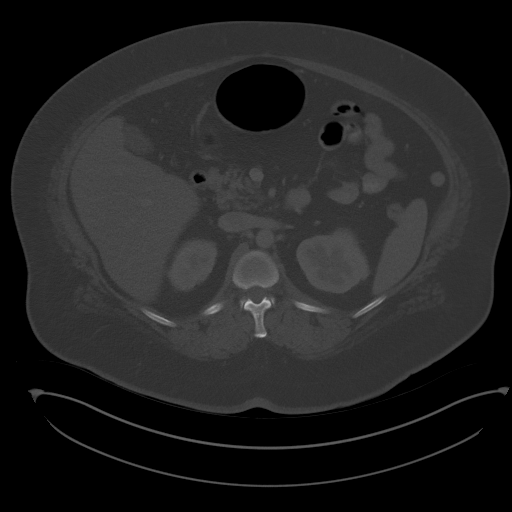
[im 74/101  soft-tissue]
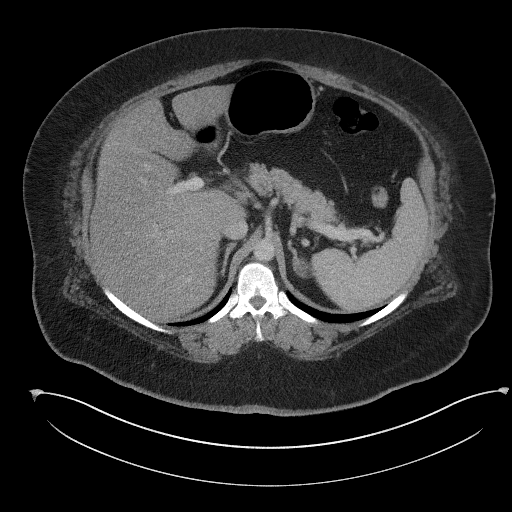
[im 79/101  soft-tissue]
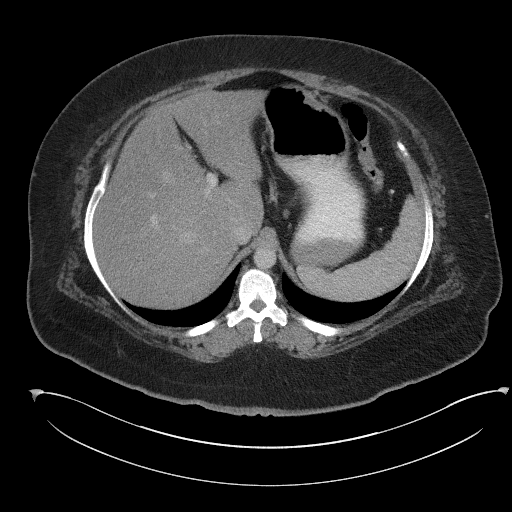
[im 87/101  soft-tissue]
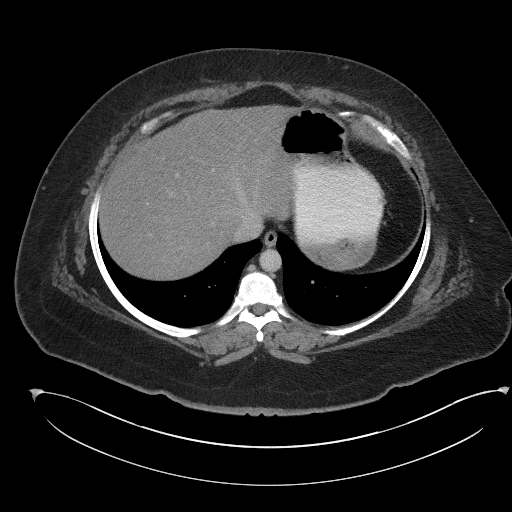
[im 96/101  soft-tissue]
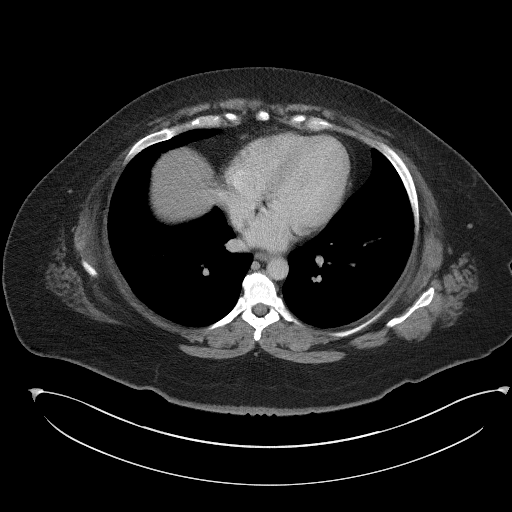

[Series 5: coronal st · coronal · 0.85mm/px · 3 of 112 slices shown]
[im 38/112  soft-tissue]
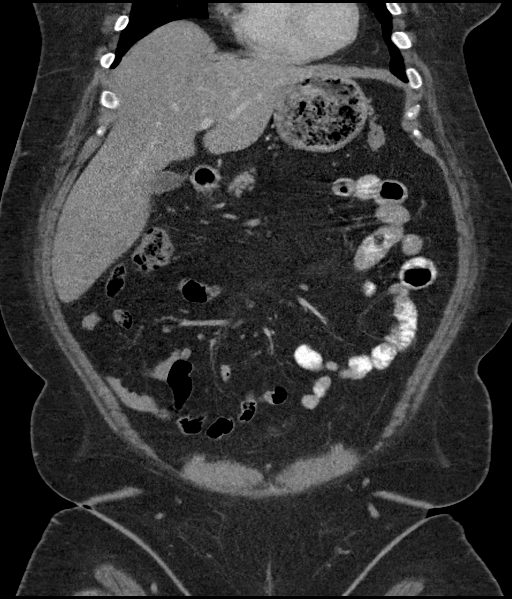
[im 50/112  soft-tissue]
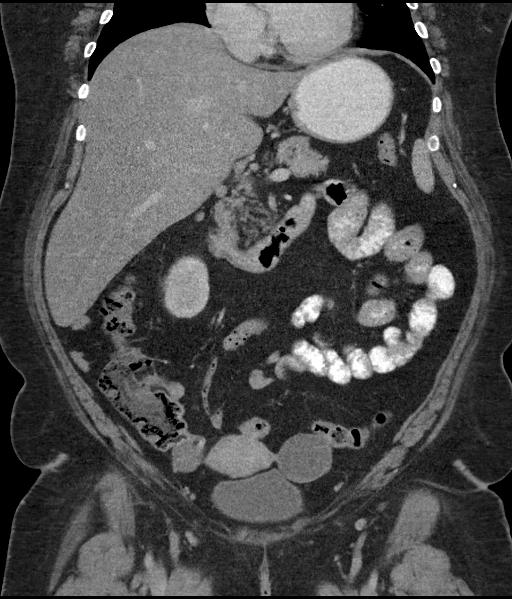
[im 62/112  soft-tissue]
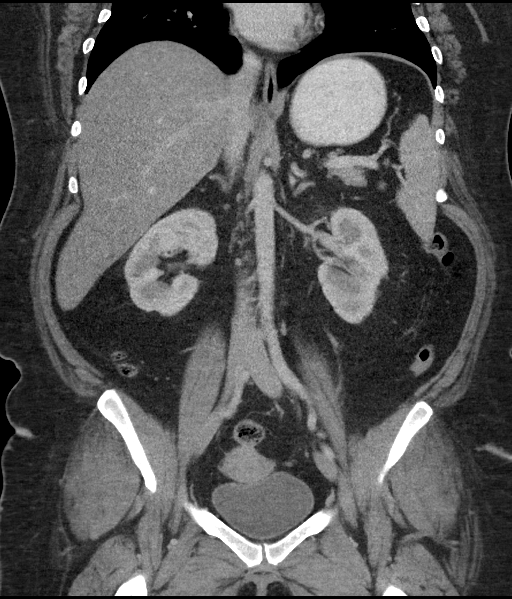

[16 of 46 positions shown; findings below may reference images not displayed]

FINDINGS: Lower chest: The lung bases are clear. No pleural or pericardial
effusion noted.

Hepatobiliary: Hepatic steatosis is identified. The gallbladder
appears normal. No biliary dilatation.

Pancreas: No mass, inflammatory changes, or other significant
abnormality.

Spleen: Within normal limits in size and appearance.

Adrenals/Urinary Tract: No masses identified. No evidence of
hydronephrosis.

Stomach/Bowel: The stomach is within normal limits. The small bowel
loops have a normal course and caliber. No obstruction. Normal
appearance of the colon. The appendix is visualized and appears
normal.

Vascular/Lymphatic: Normal appearance of the abdominal aorta. No
enlarged retroperitoneal or mesenteric adenopathy. No enlarged
pelvic or inguinal lymph nodes.

Reproductive: There is a cyst in the left ovary which measures
cm, image 77 of series 2.

Other: None.

Musculoskeletal:  No suspicious bone lesions identified.
IMPRESSION: 1. No acute findings.
2. Hepatic steatosis.
3. Left ovary cyst measures 4 cm. This is almost certainly benign,
and no specific imaging follow up is recommended according to the
Society of Radiologists in [employer] Consensus Conference
Statement (Prameshwar Tesfaye et al. Management of Asymptomatic Ovarian and
Other Adnexal Cysts Imaged at US: Society of Radiologists in

## 2016-10-03 DIAGNOSIS — Z6841 Body Mass Index (BMI) 40.0 and over, adult: Secondary | ICD-10-CM

## 2016-10-05 ENCOUNTER — Emergency Department
Admission: EM | Admit: 2016-10-05 | Discharge: 2016-10-05 | Disposition: A | Discharge status: Home / Self Care | Payer: BLUE CROSS/BLUE SHIELD | Attending: Emergency Medicine | Admitting: Emergency Medicine

## 2016-10-05 ENCOUNTER — Encounter: Payer: Self-pay | Admitting: Emergency Medicine

## 2016-10-05 DIAGNOSIS — E119 Type 2 diabetes mellitus without complications: Secondary | ICD-10-CM | POA: Insufficient documentation

## 2016-10-05 DIAGNOSIS — Z7984 Long term (current) use of oral hypoglycemic drugs: Secondary | ICD-10-CM | POA: Insufficient documentation

## 2016-10-05 DIAGNOSIS — J45909 Unspecified asthma, uncomplicated: Secondary | ICD-10-CM | POA: Insufficient documentation

## 2016-10-05 DIAGNOSIS — Z79899 Other long term (current) drug therapy: Secondary | ICD-10-CM | POA: Insufficient documentation

## 2016-10-05 DIAGNOSIS — R1013 Epigastric pain: Secondary | ICD-10-CM | POA: Diagnosis present

## 2016-10-05 DIAGNOSIS — I10 Essential (primary) hypertension: Secondary | ICD-10-CM | POA: Diagnosis not present

## 2016-10-05 LAB — URINALYSIS, COMPLETE (UACMP) WITH MICROSCOPIC
BILIRUBIN URINE: NEGATIVE
GLUCOSE, UA: NEGATIVE mg/dL
HGB URINE DIPSTICK: NEGATIVE
KETONES UR: 5 mg/dL — AB
Leukocytes, UA: NEGATIVE
NITRITE: NEGATIVE
PROTEIN: 100 mg/dL — AB
Specific Gravity, Urine: 1.027 (ref 1.005–1.030)
pH: 5 (ref 5.0–8.0)

## 2016-10-05 LAB — CBC
HEMATOCRIT: 36.1 % (ref 35.0–47.0)
HEMOGLOBIN: 12.2 g/dL (ref 12.0–16.0)
MCH: 28.9 pg (ref 26.0–34.0)
MCHC: 33.8 g/dL (ref 32.0–36.0)
MCV: 85.4 fL (ref 80.0–100.0)
Platelets: 369 10*3/uL (ref 150–440)
RBC: 4.23 MIL/uL (ref 3.80–5.20)
RDW: 13.3 % (ref 11.5–14.5)
WBC: 13.1 10*3/uL — ABNORMAL HIGH (ref 3.6–11.0)

## 2016-10-05 LAB — COMPREHENSIVE METABOLIC PANEL
ALBUMIN: 3.9 g/dL (ref 3.5–5.0)
ALT: 21 U/L (ref 14–54)
ANION GAP: 9 (ref 5–15)
AST: 18 U/L (ref 15–41)
Alkaline Phosphatase: 50 U/L (ref 38–126)
BUN: 12 mg/dL (ref 6–20)
CO2: 25 mmol/L (ref 22–32)
Calcium: 9 mg/dL (ref 8.9–10.3)
Chloride: 101 mmol/L (ref 101–111)
Creatinine, Ser: 0.69 mg/dL (ref 0.44–1.00)
GFR calc non Af Amer: >60 mL/min (ref >60)
GLUCOSE: 132 mg/dL — AB (ref 65–99)
POTASSIUM: 3.9 mmol/L (ref 3.5–5.1)
SODIUM: 135 mmol/L (ref 135–145)
TOTAL PROTEIN: 7.4 g/dL (ref 6.5–8.1)
Total Bilirubin: 0.2 mg/dL — ABNORMAL LOW (ref 0.3–1.2)

## 2016-10-05 LAB — POCT PREGNANCY, URINE: PREG TEST UR: NEGATIVE

## 2016-10-05 LAB — LIPASE, BLOOD: Lipase: 28 U/L (ref 11–51)

## 2016-10-05 MED ORDER — MORPHINE SULFATE (PF) 4 MG/ML IV SOLN
4.0000 mg | Freq: Once | INTRAVENOUS | Status: AC
  Administered 2016-10-05: 4 mg via INTRAMUSCULAR
  Filled 2016-10-05: qty 1

## 2016-10-05 MED ORDER — GI COCKTAIL ~~LOC~~
30.0000 mL | Freq: Once | ORAL | Status: AC
  Administered 2016-10-05: 30 mL via ORAL

## 2016-10-05 MED ORDER — GI COCKTAIL ~~LOC~~
ORAL | Status: AC
  Filled 2016-10-05: qty 30

## 2016-10-05 MED ORDER — ONDANSETRON 4 MG PO TBDP
ORAL_TABLET | ORAL | Status: AC
  Filled 2016-10-05: qty 1

## 2016-10-05 MED ORDER — ONDANSETRON 4 MG PO TBDP
4.0000 mg | ORAL_TABLET | Freq: Once | ORAL | Status: AC
  Administered 2016-10-05: 4 mg via ORAL

## 2016-10-05 NOTE — ED Triage Notes (Signed)
Pt to ed with c/o central upper abd pain x 3 days, diarrhea and nausea, denies vomiting.  Pt reports she is scheduled for endo on Friday

## 2016-10-05 NOTE — ED Provider Notes (Signed)
[facility] Emergency Department Provider Note   ____________________________________________    I have reviewed the triage vital signs and the nursing notes.   HISTORY  Chief Complaint Abdominal Pain     HPI Virginia Peterson is a 41 y.o. female who presents with complaints of epigastric abdominal pain. Patient reports she has had this pain for some time in fact she has endoscopy scheduled for this week to further evaluated. She reports today she ate something at lunch time and developed the pain again. She reports a burning pain which is moderate in her epigastrium. No chest pain. No fevers or chills. No nausea or vomiting or diarrhea. This is the same as her typical pain which is been going on for greater than a year. She does have a history of diabetes. She has not taken anything for the pain   Past Medical History:  Diagnosis Date  . Anxiety   . Asthma   . Depressed   . Diabetes mellitus without complication (HCC)   . Dysthymia 2016  . Hyperlipidemia   . Hypertension     Patient Active Problem List   Diagnosis Date Noted  . Asthma without status asthmaticus 07/20/2016  . Diabetes mellitus type 2, uncomplicated (HCC) 07/20/2016  . Disc disease, degenerative, lumbar or lumbosacral 07/20/2016  . Generalized anxiety disorder 07/20/2016  . Hyperlipidemia, unspecified 07/20/2016  . Hypertension 07/20/2016  . Pain syndrome, chronic 07/20/2016  . Depression 10/06/2015  . Adjustment disorder with mixed anxiety and depressed mood 10/06/2015    Past Surgical History:  Procedure Laterality Date  . benign neck tumor  2012  . CESAREAN SECTION    . NECK SURGERY      Prior to Admission medications   Medication Sig Start Date End Date Taking? Authorizing Provider  albuterol (PROVENTIL HFA;VENTOLIN HFA) 108 (90 BASE) MCG/ACT inhaler Inhale 2 puffs into the lungs every 6 (six) hours as needed for wheezing or shortness of breath. 04/25/15   Tommi Rumpshonda L  Summers, PA-C  ALPRAZolam Prudy Feeler(XANAX) 1 MG tablet Take 1 mg by mouth.    Historical Provider, MD  atorvastatin (LIPITOR) 20 MG tablet TK 1 T PO QD 08/23/16   Historical Provider, MD  carisoprodol (SOMA) 350 MG tablet Take by mouth.    Historical Provider, MD  cefdinir (OMNICEF) 300 MG capsule  08/14/16   Historical Provider, MD  cyclobenzaprine (FLEXERIL) 10 MG tablet Take by mouth.    Historical Provider, MD  escitalopram (LEXAPRO) 10 MG tablet Take by mouth.    Historical Provider, MD  famotidine (PEPCID) 20 MG tablet Take 1 tablet (20 mg total) by mouth 2 (two) times daily. Patient not taking: Reported on 09/03/2016 01/04/16   Emily FilbertJonathan E Williams, MD  fluticasone [facility](FLONASE) 50 MCG/ACT nasal spray SHAKE LQ AND U 2 SPRAYS IEN QD PRF RHINITIS OR ALLERGIES 08/14/16   Historical Provider, MD  gabapentin (NEURONTIN) 300 MG capsule Take 1 capsule (300 mg total) by mouth 3 (three) times daily. Patient not taking: Reported on 09/03/2016 01/12/16   Diego F Pabon, MD  glipiZIDE (GLUCOTROL) 10 MG tablet Take 1 tablet (10 mg total) by mouth 2 (two) times daily. 06/13/16 07/13/16  Jene Everyobert Lella Mullany, MD  HYDROcodone-acetaminophen (NORCO/VICODIN) 5-325 MG tablet Take 1 tablet by mouth every 6 (six) hours as needed. for pain 08/07/16   Historical Provider, MD  ipratropium-albuterol (DUONEB) 0.5-2.5 (3) MG/3ML SOLN Inhale into the lungs. 07/20/16 07/15/17  Historical Provider, MD  lidocaine (LIDODERM) 5 % Place 1 patch onto the skin  daily. Patient not taking: Reported on 09/03/2016 07/28/16   Jennye Moccasin, MD  lisinopril (PRINIVIL,ZESTRIL) 10 MG tablet Take 1 tablet (10 mg total) by mouth daily. 06/13/16 07/13/16  Jene Every, MD  lisinopril (PRINIVIL,ZESTRIL) 10 MG tablet TK 1 T PO  BID. 08/13/16   Historical Provider, MD  LORazepam (ATIVAN) 1 MG tablet Take 1 tablet (1 mg total) by mouth 2 (two) times daily as needed for anxiety. 07/28/16   Jennye Moccasin, MD  meloxicam (MOBIC) 15 MG tablet Take 1 tablet (15 mg total) by mouth  daily. Patient not taking: Reported on 09/03/2016 09/21/15   Chinita Pester, FNP  metFORMIN (GLUCOPHAGE) 1000 MG tablet Take 1 tablet (1,000 mg total) by mouth 2 (two) times daily with a meal. 06/13/16 07/13/16  Jene Every, MD  metFORMIN (GLUCOPHAGE) 1000 MG tablet TK 1 T PO  BID 08/13/16   Historical Provider, MD  naproxen (NAPROSYN) 500 MG tablet Take 1 tablet (500 mg total) by mouth 2 (two) times daily with a meal. Patient not taking: Reported on 09/03/2016 05/25/16   Jami L Hagler, PA-C  omeprazole (PRILOSEC) 40 MG capsule Take 1 capsule (40 mg total) by mouth daily. 09/03/16   Wyline Mood, MD  ondansetron (ZOFRAN) 4 MG tablet Take 1 tablet (4 mg total) by mouth daily as needed. Patient not taking: Reported on 09/03/2016 10/21/15   Myrna Blazer, MD  oxyCODONE-acetaminophen (ROXICET) 5-325 MG tablet Take 1 tablet by mouth every 6 (six) hours as needed for moderate pain. 09/08/16   Joni Reining, PA-C  pravastatin (PRAVACHOL) 10 MG tablet Take 1 tablet (10 mg total) by mouth daily. 03/04/16 03/18/16  Jami L Hagler, PA-C  sucralfate (CARAFATE) 1 g tablet Take 1 tablet (1 g total) by mouth 4 (four) times daily. Patient not taking: Reported on 09/03/2016 01/08/16   Phineas Semen, MD  traMADol (ULTRAM) 50 MG tablet Take by mouth. 07/20/16   Historical Provider, MD     Allergies Tramadol  Family History  Problem Relation Age of Onset  . Diabetes Mother   . Diabetes Father   . Diabetes Maternal Grandfather   . Diabetes Paternal Grandmother     Social History Social History  Substance Use Topics  . Smoking status: Never Smoker  . Smokeless tobacco: Never Used  . Alcohol use No     Comment: occasional    Review of Systems  Constitutional: No fever/chills Eyes: No visual changes.  ENT: No sore throat. Cardiovascular: Denies chest pain. Respiratory: Denies shortness of breath. Gastrointestinal: As above Genitourinary: Negative for dysuria. Musculoskeletal: Negative for back  pain. Skin: Negative for rash. Neurological: Negative for headaches or weakness   ____________________________________________   PHYSICAL EXAM:  VITAL SIGNS: ED Triage Vitals  Enc Vitals Group     BP 10/05/16 1700 (!) 153/72     Pulse Rate 10/05/16 1700 89     Resp 10/05/16 1700 18     Temp 10/05/16 1700 97.8 F (36.6 C)     Temp Source 10/05/16 1700 Oral     SpO2 10/05/16 1700 96 %     Weight 10/05/16 1641 238 lb (108 kg)     Height 10/05/16 1641 5\' 4"  (1.626 m)     Head Circumference --      Peak Flow --      Pain Score 10/05/16 1640 10     Pain Loc --      Pain Edu? --      Excl. in GC? --  Constitutional: Alert and oriented. No acute distress. Pleasant and interactive Eyes: Conjunctivae are normal.    Mouth/Throat: Mucous membranes are moist.    Cardiovascular: Normal rate, regular rhythm. Grossly normal heart sounds.  Good peripheral circulation. Respiratory: Normal respiratory effort.  No retractions. Lungs CTAB. Gastrointestinal: Soft and nontender. No distention.  No CVA tenderness. Genitourinary: deferred Musculoskeletal: No lower extremity tenderness nor edema.  Warm and well perfused Neurologic:  Normal speech and language. No gross focal neurologic deficits are appreciated.  Skin:  Skin is warm, dry and intact. No rash noted. Psychiatric: Mood and affect are normal. Speech and behavior are normal.  ____________________________________________   LABS (all labs ordered are listed, but only abnormal results are displayed)  Labs Reviewed  COMPREHENSIVE METABOLIC PANEL - Abnormal; Notable for the following:       Result Value   Glucose, Bld 132 (*)    Total Bilirubin 0.2 (*)    All other components within normal limits  CBC - Abnormal; Notable for the following:    WBC 13.1 (*)    All other components within normal limits  URINALYSIS, COMPLETE (UACMP) WITH MICROSCOPIC - Abnormal; Notable for the following:    Color, Urine YELLOW (*)    APPearance  HAZY (*)    Ketones, ur 5 (*)    Protein, ur 100 (*)    Bacteria, UA RARE (*)    Squamous Epithelial / LPF 0-5 (*)    All other components within normal limits  LIPASE, BLOOD  POC URINE PREG, ED  POCT PREGNANCY, URINE   ____________________________________________  EKG  None ____________________________________________  RADIOLOGY  None ____________________________________________   PROCEDURES  Procedure(s) performed: No    Critical Care performed:No ____________________________________________   INITIAL IMPRESSION / ASSESSMENT AND PLAN / ED COURSE  Pertinent labs & imaging results that were available during my care of the patient were reviewed by me and considered in my medical decision making (see chart for details).  Patient well-appearing and in no acute distress. Exam is reassuring. Vital signs are unremarkable. Lab work is significant only for a mildly elevated white blood cell count of uncertain significance. Patient was treated with GI cocktail as well as IM morphine, this resolved her pain completely. Given that she has appropriate outpatient follow-up with endoscopy arranged do not feel further testing in the emergency department is warranted at this time. She agrees with this plan. We will discharge with instructions to return if pain returns or any changes in her symptoms    ____________________________________________   FINAL CLINICAL IMPRESSION(S) / ED DIAGNOSES  Final diagnoses:  Epigastric pain      NEW MEDICATIONS STARTED DURING THIS VISIT:  Discharge Medication List as of 10/05/2016  8:51 PM       Note:  This document was prepared using Dragon voice recognition software and may include unintentional dictation errors.    Jene Every, MD 10/05/16 2201

## 2016-10-08 ENCOUNTER — Ambulatory Visit: Payer: BLUE CROSS/BLUE SHIELD | Admitting: Gastroenterology

## 2016-10-10 ENCOUNTER — Emergency Department
Admission: EM | Admit: 2016-10-10 | Discharge: 2016-10-10 | Disposition: A | Discharge status: Home / Self Care | Payer: BLUE CROSS/BLUE SHIELD | Attending: Student in an Organized Health Care Education/Training Program | Admitting: Student in an Organized Health Care Education/Training Program

## 2016-10-10 ENCOUNTER — Telehealth: Payer: Self-pay | Admitting: Gastroenterology

## 2016-10-10 ENCOUNTER — Encounter: Payer: Self-pay | Admitting: Emergency Medicine

## 2016-10-10 DIAGNOSIS — R112 Nausea with vomiting, unspecified: Secondary | ICD-10-CM | POA: Diagnosis not present

## 2016-10-10 DIAGNOSIS — J45909 Unspecified asthma, uncomplicated: Secondary | ICD-10-CM | POA: Diagnosis not present

## 2016-10-10 DIAGNOSIS — E119 Type 2 diabetes mellitus without complications: Secondary | ICD-10-CM | POA: Insufficient documentation

## 2016-10-10 DIAGNOSIS — R1013 Epigastric pain: Secondary | ICD-10-CM | POA: Diagnosis present

## 2016-10-10 DIAGNOSIS — I10 Essential (primary) hypertension: Secondary | ICD-10-CM | POA: Diagnosis not present

## 2016-10-10 DIAGNOSIS — Z7984 Long term (current) use of oral hypoglycemic drugs: Secondary | ICD-10-CM | POA: Diagnosis not present

## 2016-10-10 DIAGNOSIS — Z79899 Other long term (current) drug therapy: Secondary | ICD-10-CM | POA: Insufficient documentation

## 2016-10-10 LAB — URINALYSIS, COMPLETE (UACMP) WITH MICROSCOPIC
BACTERIA UA: NONE SEEN
Bilirubin Urine: NEGATIVE
Glucose, UA: 150 mg/dL — AB
HGB URINE DIPSTICK: NEGATIVE
Ketones, ur: NEGATIVE mg/dL
NITRITE: NEGATIVE
Protein, ur: 100 mg/dL — AB
SPECIFIC GRAVITY, URINE: 1.03 (ref 1.005–1.030)
pH: 5 (ref 5.0–8.0)

## 2016-10-10 LAB — COMPREHENSIVE METABOLIC PANEL
ALT: 28 U/L (ref 14–54)
ANION GAP: 8 (ref 5–15)
AST: 22 U/L (ref 15–41)
Albumin: 3.9 g/dL (ref 3.5–5.0)
Alkaline Phosphatase: 70 U/L (ref 38–126)
BUN: 10 mg/dL (ref 6–20)
CHLORIDE: 102 mmol/L (ref 101–111)
CO2: 23 mmol/L (ref 22–32)
CREATININE: 0.49 mg/dL (ref 0.44–1.00)
Calcium: 9.3 mg/dL (ref 8.9–10.3)
Glucose, Bld: 224 mg/dL — ABNORMAL HIGH (ref 65–99)
POTASSIUM: 4 mmol/L (ref 3.5–5.1)
SODIUM: 133 mmol/L — AB (ref 135–145)
TOTAL PROTEIN: 7.6 g/dL (ref 6.5–8.1)
Total Bilirubin: 0.4 mg/dL (ref 0.3–1.2)

## 2016-10-10 LAB — CBC
HEMATOCRIT: 35.7 % (ref 35.0–47.0)
Hemoglobin: 12.3 g/dL (ref 12.0–16.0)
MCH: 29.2 pg (ref 26.0–34.0)
MCHC: 34.5 g/dL (ref 32.0–36.0)
MCV: 84.6 fL (ref 80.0–100.0)
Platelets: 410 10*3/uL (ref 150–440)
RBC: 4.21 MIL/uL (ref 3.80–5.20)
RDW: 13.8 % (ref 11.5–14.5)
WBC: 9.4 10*3/uL (ref 3.6–11.0)

## 2016-10-10 LAB — POCT PREGNANCY, URINE: PREG TEST UR: NEGATIVE

## 2016-10-10 LAB — LIPASE, BLOOD: LIPASE: 34 U/L (ref 11–51)

## 2016-10-10 MED ORDER — PROMETHAZINE HCL 12.5 MG PO TABS: 12.5000 mg | ORAL_TABLET | Freq: Four times a day (QID) | ORAL | 0 refills | Status: DC | PRN

## 2016-10-10 MED ORDER — GI COCKTAIL ~~LOC~~
30.0000 mL | Freq: Once | ORAL | Status: AC
  Administered 2016-10-10: 30 mL via ORAL
  Filled 2016-10-10: qty 30

## 2016-10-10 MED ORDER — PANTOPRAZOLE SODIUM 40 MG PO TBEC
40.0000 mg | DELAYED_RELEASE_TABLET | Freq: Once | ORAL | Status: AC
  Administered 2016-10-10: 40 mg via ORAL
  Filled 2016-10-10: qty 1

## 2016-10-10 MED ORDER — MORPHINE SULFATE (PF) 4 MG/ML IV SOLN
4.0000 mg | INTRAVENOUS | Status: DC | PRN
  Administered 2016-10-10: 4 mg via INTRAVENOUS
  Filled 2016-10-10: qty 1

## 2016-10-10 MED ORDER — PROMETHAZINE HCL 25 MG/ML IJ SOLN
12.5000 mg | Freq: Four times a day (QID) | INTRAMUSCULAR | Status: DC | PRN
  Administered 2016-10-10: 12.5 mg via INTRAVENOUS
  Filled 2016-10-10 (×2): qty 1

## 2016-10-10 MED ORDER — HALOPERIDOL LACTATE 5 MG/ML IJ SOLN
5.0000 mg | Freq: Once | INTRAMUSCULAR | Status: AC
  Administered 2016-10-10: 5 mg via INTRAVENOUS
  Filled 2016-10-10 (×2): qty 1

## 2016-10-10 MED ORDER — PANTOPRAZOLE SODIUM 20 MG PO TBEC: 20.0000 mg | DELAYED_RELEASE_TABLET | Freq: Every day | ORAL | 1 refills | Status: DC

## 2016-10-10 MED ORDER — HYDROCODONE-ACETAMINOPHEN 5-325 MG PO TABS: 1.0000 | ORAL_TABLET | ORAL | 0 refills | Status: DC | PRN

## 2016-10-10 MED ORDER — SODIUM CHLORIDE 0.9 % IV BOLUS (SEPSIS)
1000.0000 mL | Freq: Once | INTRAVENOUS | Status: AC
  Administered 2016-10-10: 1000 mL via INTRAVENOUS

## 2016-10-10 NOTE — Telephone Encounter (Signed)
Patient called and went [facility] ER on 10/05/16 and is still having problems. She is still having abd pain, diarrhea and chills. Please call patient. She can't keep food or water down. She is extremely nauseous.

## 2016-10-10 NOTE — ED Notes (Signed)
Patient presents to the ED with upper abdominal pain and vomiting x 1.  Patient also reports body aches and chills.  Patient reports difficulty sleeping at night due to pain.  Patient states percocet is not relieving pain.  Patient states she called her GI doctor who instructed patient to come to the ED for fluids.  Patient states she has an endoscopy scheduled for Friday.

## 2016-10-10 NOTE — ED Provider Notes (Signed)
[facility] Emergency Department Provider Note    First MD Initiated Contact with Patient 10/10/16 1805     (approximate)  I have reviewed the triage vital signs and the nursing notes.   HISTORY  Chief Complaint Abdominal Pain and Emesis    HPI Virginia Peterson is a 41 y.o. female with history of anxiety and depression presents with persistent epigastric pain associated with nausea and vomiting and concern for dehydration.  Has not been having any bloody diarrhea or stools. No bloody emesis. Pain is midline without radiation through to her back. No right upper quadrant pain. Patient is scheduled to have endoscopy to further evaluate for any evidence of ulcer on Friday. Arrives to the ER ambulating with no acute distress. No fevers. He appears pleasant and comfortable.   Past Medical History:  Diagnosis Date  . Anxiety   . Asthma   . Depressed   . Diabetes mellitus without complication (HCC)   . Dysthymia 2016  . Hyperlipidemia   . Hypertension    Family History  Problem Relation Age of Onset  . Diabetes Mother   . Diabetes Father   . Diabetes Maternal Grandfather   . Diabetes Paternal Grandmother    Past Surgical History:  Procedure Laterality Date  . benign neck tumor  2012  . CESAREAN SECTION    . NECK SURGERY     Patient Active Problem List   Diagnosis Date Noted  . Asthma without status asthmaticus 07/20/2016  . Diabetes mellitus type 2, uncomplicated (HCC) 07/20/2016  . Disc disease, degenerative, lumbar or lumbosacral 07/20/2016  . Generalized anxiety disorder 07/20/2016  . Hyperlipidemia, unspecified 07/20/2016  . Hypertension 07/20/2016  . Pain syndrome, chronic 07/20/2016  . Depression 10/06/2015  . Adjustment disorder with mixed anxiety and depressed mood 10/06/2015      Prior to Admission medications   Medication Sig Start Date End Date Taking? Authorizing Provider  albuterol (PROVENTIL HFA;VENTOLIN HFA) 108 (90 BASE)  MCG/ACT inhaler Inhale 2 puffs into the lungs every 6 (six) hours as needed for wheezing or shortness of breath. 04/25/15   Tommi Rumps, PA-C  ALPRAZolam Prudy Feeler) 1 MG tablet Take 1 mg by mouth.    [provider]  atorvastatin (LIPITOR) 20 MG tablet TK 1 T PO QD 08/23/16   [provider]  carisoprodol (SOMA) 350 MG tablet Take by mouth.    [provider]  cefdinir (OMNICEF) 300 MG capsule  08/14/16   [provider]  cyclobenzaprine (FLEXERIL) 10 MG tablet Take by mouth.    [provider]  escitalopram (LEXAPRO) 10 MG tablet Take by mouth.    [provider]  famotidine (PEPCID) 20 MG tablet Take 1 tablet (20 mg total) by mouth 2 (two) times daily. Patient not taking: Reported on 09/03/2016 01/04/16   Emily Filbert, MD  fluticasone [facility]) 50 MCG/ACT nasal spray SHAKE LQ AND U 2 SPRAYS IEN QD PRF RHINITIS OR ALLERGIES 08/14/16   [provider]  gabapentin (NEURONTIN) 300 MG capsule Take 1 capsule (300 mg total) by mouth 3 (three) times daily. Patient not taking: Reported on 09/03/2016 01/12/16   Pabon, Cecelia Byars F, MD  glipiZIDE (GLUCOTROL) 10 MG tablet Take 1 tablet (10 mg total) by mouth 2 (two) times daily. 06/13/16 07/13/16  Jene Every, MD  HYDROcodone-acetaminophen (NORCO/VICODIN) 5-325 MG tablet Take 1 tablet by mouth every 6 (six) hours as needed. for pain 08/07/16   [provider]  ipratropium-albuterol (DUONEB) 0.5-2.5 (3) MG/3ML SOLN  Inhale into the lungs. 07/20/16 07/15/17  [provider]  lidocaine (LIDODERM) 5 % Place 1 patch onto the skin daily. Patient not taking: Reported on 09/03/2016 07/28/16   Jennye Moccasin, MD  lisinopril (PRINIVIL,ZESTRIL) 10 MG tablet Take 1 tablet (10 mg total) by mouth daily. 06/13/16 07/13/16  Jene Every, MD  lisinopril (PRINIVIL,ZESTRIL) 10 MG tablet TK 1 T PO  BID. 08/13/16   [provider]  LORazepam (ATIVAN) 1 MG tablet Take 1 tablet (1 mg total) by mouth 2  (two) times daily as needed for anxiety. 07/28/16   Jennye Moccasin, MD  meloxicam (MOBIC) 15 MG tablet Take 1 tablet (15 mg total) by mouth daily. Patient not taking: Reported on 09/03/2016 09/21/15   Kem Boroughs B, FNP  metFORMIN (GLUCOPHAGE) 1000 MG tablet Take 1 tablet (1,000 mg total) by mouth 2 (two) times daily with a meal. 06/13/16 07/13/16  Jene Every, MD  metFORMIN (GLUCOPHAGE) 1000 MG tablet TK 1 T PO  BID 08/13/16   [provider]  naproxen (NAPROSYN) 500 MG tablet Take 1 tablet (500 mg total) by mouth 2 (two) times daily with a meal. Patient not taking: Reported on 09/03/2016 05/25/16   Hagler, Jami L, PA-C  omeprazole (PRILOSEC) 40 MG capsule Take 1 capsule (40 mg total) by mouth daily. 09/03/16   Wyline Mood, MD  ondansetron (ZOFRAN) 4 MG tablet Take 1 tablet (4 mg total) by mouth daily as needed. Patient not taking: Reported on 09/03/2016 10/21/15   Myrna Blazer, MD  oxyCODONE-acetaminophen (ROXICET) 5-325 MG tablet Take 1 tablet by mouth every 6 (six) hours as needed for moderate pain. 09/08/16   Joni Reining, PA-C  pravastatin (PRAVACHOL) 10 MG tablet Take 1 tablet (10 mg total) by mouth daily. 03/04/16 03/18/16  Hagler, Jami L, PA-C  sucralfate (CARAFATE) 1 g tablet Take 1 tablet (1 g total) by mouth 4 (four) times daily. Patient not taking: Reported on 09/03/2016 01/08/16   Phineas Semen, MD  traMADol Janean Sark) 50 MG tablet Take by mouth. 07/20/16   [provider]    Allergies Tramadol    Social History Social History  Substance Use Topics  . Smoking status: Never Smoker  . Smokeless tobacco: Never Used  . Alcohol use No     Comment: occasional    Review of Systems Patient denies headaches, rhinorrhea, blurry vision, numbness, shortness of breath, chest pain, edema, cough, abdominal pain, nausea, vomiting, diarrhea, dysuria, fevers, rashes or hallucinations unless otherwise stated above in  HPI. ____________________________________________   PHYSICAL EXAM:  VITAL SIGNS: Vitals:   10/10/16 1637  BP: 135/72  Pulse: (!) 105  Resp: 16  Temp: 98.4 F (36.9 C)    Constitutional: Alert and oriented. Well appearing and in no acute distress. Eyes: Conjunctivae are normal. PERRL. EOMI. Head: Atraumatic. Nose: No congestion/rhinnorhea. Mouth/Throat: Mucous membranes are moist.  Oropharynx non-erythematous. Neck: No stridor. Painless ROM. No cervical spine tenderness to palpation Hematological/Lymphatic/Immunilogical: No cervical lymphadenopathy. Cardiovascular: Normal rate, regular rhythm. Grossly normal heart sounds.  Good peripheral circulation. Respiratory: Normal respiratory effort.  No retractions. Lungs CTAB. Gastrointestinal: morbidly obese, Soft and nontender. No distention. No abdominal bruits. No CVA tenderness. Musculoskeletal: No lower extremity tenderness nor edema.  No joint effusions. Neurologic:  Normal speech and language. No gross focal neurologic deficits are appreciated. No gait instability. Skin:  Skin is warm, dry and intact. No rash noted. Psychiatric: Mood and affect are normal. Speech and behavior are normal.  ____________________________________________   LABS (all  labs ordered are listed, but only abnormal results are displayed)  Results for orders placed or performed during the hospital encounter of 10/10/16 (from the past 24 hour(s))  Lipase, blood     Status: None   Collection Time: 10/10/16  4:37 PM  Result Value Ref Range   Lipase 34 11 - 51 U/L  Comprehensive metabolic panel     Status: Abnormal   Collection Time: 10/10/16  4:37 PM  Result Value Ref Range   Sodium 133 (L) 135 - 145 mmol/L   Potassium 4.0 3.5 - 5.1 mmol/L   Chloride 102 101 - 111 mmol/L   CO2 23 22 - 32 mmol/L   Glucose, Bld 224 (H) 65 - 99 mg/dL   BUN 10 6 - 20 mg/dL   Creatinine, Ser 0.980.49 0.44 - 1.00 mg/dL   Calcium 9.3 8.9 - 11.910.3 mg/dL   Total Protein 7.6 6.5 -  8.1 g/dL   Albumin 3.9 3.5 - 5.0 g/dL   AST 22 15 - 41 U/L   ALT 28 14 - 54 U/L   Alkaline Phosphatase 70 38 - 126 U/L   Total Bilirubin 0.4 0.3 - 1.2 mg/dL   GFR calc non Af Amer >60 >60 mL/min   GFR calc Af Amer >60 >60 mL/min   Anion gap 8 5 - 15  CBC     Status: None   Collection Time: 10/10/16  4:37 PM  Result Value Ref Range   WBC 9.4 3.6 - 11.0 K/uL   RBC 4.21 3.80 - 5.20 MIL/uL   Hemoglobin 12.3 12.0 - 16.0 g/dL   HCT 14.735.7 82.935.0 - 56.247.0 %   MCV 84.6 80.0 - 100.0 fL   MCH 29.2 26.0 - 34.0 pg   MCHC 34.5 32.0 - 36.0 g/dL   RDW 13.013.8 86.511.5 - 78.414.5 %   Platelets 410 150 - 440 K/uL  Urinalysis, Complete w Microscopic     Status: Abnormal   Collection Time: 10/10/16  4:39 PM  Result Value Ref Range   Color, Urine AMBER (A) YELLOW   APPearance CLOUDY (A) CLEAR   Specific Gravity, Urine 1.030 1.005 - 1.030   pH 5.0 5.0 - 8.0   Glucose, UA 150 (A) NEGATIVE mg/dL   Hgb urine dipstick NEGATIVE NEGATIVE   Bilirubin Urine NEGATIVE NEGATIVE   Ketones, ur NEGATIVE NEGATIVE mg/dL   Protein, ur 696100 (A) NEGATIVE mg/dL   Nitrite NEGATIVE NEGATIVE   Leukocytes, UA TRACE (A) NEGATIVE   RBC / HPF 0-5 0 - 5 RBC/hpf   WBC, UA 6-30 0 - 5 WBC/hpf   Bacteria, UA NONE SEEN NONE SEEN   Squamous Epithelial / LPF 6-30 (A) NONE SEEN   Mucous PRESENT   Pregnancy, urine POC     Status: None   Collection Time: 10/10/16  4:45 PM  Result Value Ref Range   Preg Test, Ur NEGATIVE NEGATIVE   ____________________________________________  EKG____________________________________________  RADIOLOGY  none ____________________________________________   PROCEDURES  Procedure(s) performed:  Procedures    Critical Care performed: no ____________________________________________   INITIAL IMPRESSION / ASSESSMENT AND PLAN / ED COURSE  Pertinent labs & imaging results that were available during my care of the patient were reviewed by me and considered in my medical decision making (see chart for  details).  DDX: enteritis, gastritis, pud, cholelithiasis, cholecysitits, pancreatitis  Virginia Peterson is a 41 y.o. who presents to the ED with nausea vomiting and epigastric pain as described above. Patient afebrile, well-appearing and in no acute distress. Clinically  does not appear significantly dehydrated. Lab work ordered out of triage for above complaints shows no evidence of acute leukocytosis, anemia, liver enzyme abnormality or evidence of pancreatitis. We'll provide symptomatically treatment. Her abdominal exam is soft and benign. I do suspect some component of peptic ulcer disease which can be treated with PPI. We'll provide IV fluids for mild tachycardia.  Clinical Course as of Oct 10 2037  Wed Oct 10, 2016  2035 Patient tolerating oral hydration. Pain is resolved. Patient in no acute distress. Patient requesting discharge home. Based on normal labs and ability to tolerate oral hydration with history of chronic abdominal pain and do feel that she is stable for follow-up with GI.  [PR]    Clinical Course User Index [PR] Willy Eddy, MD     ____________________________________________   FINAL CLINICAL IMPRESSION(S) / ED DIAGNOSES  Final diagnoses:  Non-intractable vomiting with nausea, unspecified vomiting type  Epigastric pain      NEW MEDICATIONS STARTED DURING THIS VISIT:  New Prescriptions   No medications on file     Note:  This document was prepared using Dragon voice recognition software and may include unintentional dictation errors.    Willy Eddy, MD 10/10/16 2045

## 2016-10-10 NOTE — Discharge Instructions (Signed)

## 2016-10-10 NOTE — Telephone Encounter (Signed)
Pt advised she is scheduled for an [facility] at [facility] on Friday, May 11th to see what is going on with her stomach. Pt stated she was severely dehydrated and felt like she needed to go to the ER. I advised her if she felt this way especially if she could not keep fluids down that was a good idea.

## 2016-10-10 NOTE — ED Triage Notes (Signed)
Patient C/O vomiting and chills, abdominal pain.  Patient states "I am feeling dehydrated".  Seen through ED last Friday for same complaints.

## 2016-10-11 ENCOUNTER — Encounter: Payer: Self-pay | Admitting: *Deleted

## 2016-10-12 ENCOUNTER — Encounter
Admission: RE | Disposition: A | Discharge status: Home / Self Care | Payer: Self-pay | Source: Ambulatory Visit | Attending: Gastroenterology

## 2016-10-12 ENCOUNTER — Ambulatory Visit: Payer: BLUE CROSS/BLUE SHIELD | Admitting: Anesthesiology

## 2016-10-12 ENCOUNTER — Ambulatory Visit
Admission: RE | Admit: 2016-10-12 | Discharge: 2016-10-12 | Disposition: A | Discharge status: Home / Self Care | Payer: BLUE CROSS/BLUE SHIELD | Source: Ambulatory Visit | Attending: Gastroenterology | Admitting: Gastroenterology

## 2016-10-12 ENCOUNTER — Encounter: Payer: Self-pay | Admitting: *Deleted

## 2016-10-12 DIAGNOSIS — R109 Unspecified abdominal pain: Secondary | ICD-10-CM | POA: Insufficient documentation

## 2016-10-12 DIAGNOSIS — K2950 Unspecified chronic gastritis without bleeding: Secondary | ICD-10-CM | POA: Insufficient documentation

## 2016-10-12 DIAGNOSIS — R112 Nausea with vomiting, unspecified: Secondary | ICD-10-CM | POA: Diagnosis not present

## 2016-10-12 DIAGNOSIS — E785 Hyperlipidemia, unspecified: Secondary | ICD-10-CM | POA: Diagnosis not present

## 2016-10-12 DIAGNOSIS — J45909 Unspecified asthma, uncomplicated: Secondary | ICD-10-CM | POA: Diagnosis not present

## 2016-10-12 DIAGNOSIS — R1013 Epigastric pain: Secondary | ICD-10-CM | POA: Diagnosis not present

## 2016-10-12 DIAGNOSIS — K2980 Duodenitis without bleeding: Secondary | ICD-10-CM | POA: Insufficient documentation

## 2016-10-12 DIAGNOSIS — F419 Anxiety disorder, unspecified: Secondary | ICD-10-CM | POA: Insufficient documentation

## 2016-10-12 DIAGNOSIS — K1120 Sialoadenitis, unspecified: Secondary | ICD-10-CM

## 2016-10-12 DIAGNOSIS — Z7984 Long term (current) use of oral hypoglycemic drugs: Secondary | ICD-10-CM | POA: Diagnosis not present

## 2016-10-12 DIAGNOSIS — I10 Essential (primary) hypertension: Secondary | ICD-10-CM | POA: Insufficient documentation

## 2016-10-12 DIAGNOSIS — F329 Major depressive disorder, single episode, unspecified: Secondary | ICD-10-CM | POA: Insufficient documentation

## 2016-10-12 DIAGNOSIS — Z7951 Long term (current) use of inhaled steroids: Secondary | ICD-10-CM | POA: Diagnosis not present

## 2016-10-12 DIAGNOSIS — E119 Type 2 diabetes mellitus without complications: Secondary | ICD-10-CM | POA: Diagnosis not present

## 2016-10-12 DIAGNOSIS — Z79899 Other long term (current) drug therapy: Secondary | ICD-10-CM | POA: Diagnosis not present

## 2016-10-12 HISTORY — PX: ESOPHAGOGASTRODUODENOSCOPY (EGD) WITH PROPOFOL: SHX5813

## 2016-10-12 LAB — POCT PREGNANCY, URINE: Preg Test, Ur: NEGATIVE

## 2016-10-12 LAB — GLUCOSE, CAPILLARY: Glucose-Capillary: 230 mg/dL — ABNORMAL HIGH (ref 65–99)

## 2016-10-12 SURGERY — ESOPHAGOGASTRODUODENOSCOPY (EGD) WITH PROPOFOL
Anesthesia: General

## 2016-10-12 MED ORDER — LIDOCAINE HCL (CARDIAC) 20 MG/ML IV SOLN
INTRAVENOUS | Status: DC | PRN
  Administered 2016-10-12: 40 mg via INTRAVENOUS

## 2016-10-12 MED ORDER — PROPOFOL 10 MG/ML IV BOLUS
INTRAVENOUS | Status: AC
  Filled 2016-10-12: qty 40

## 2016-10-12 MED ORDER — SODIUM CHLORIDE 0.9 % IV SOLN
INTRAVENOUS | Status: DC
  Administered 2016-10-12: 1000 mL via INTRAVENOUS

## 2016-10-12 MED ORDER — FENTANYL CITRATE (PF) 100 MCG/2ML IJ SOLN
INTRAMUSCULAR | Status: AC
  Filled 2016-10-12: qty 2

## 2016-10-12 MED ORDER — MIDAZOLAM HCL 2 MG/2ML IJ SOLN
INTRAMUSCULAR | Status: DC | PRN
  Administered 2016-10-12: 2 mg via INTRAVENOUS

## 2016-10-12 MED ORDER — MIDAZOLAM HCL 2 MG/2ML IJ SOLN
INTRAMUSCULAR | Status: AC
  Filled 2016-10-12: qty 2

## 2016-10-12 MED ORDER — FENTANYL CITRATE (PF) 100 MCG/2ML IJ SOLN
INTRAMUSCULAR | Status: DC | PRN
  Administered 2016-10-12: 50 ug via INTRAVENOUS

## 2016-10-12 MED ORDER — GLYCOPYRROLATE 0.2 MG/ML IJ SOLN
INTRAMUSCULAR | Status: AC
  Filled 2016-10-12: qty 1

## 2016-10-12 MED ORDER — PHENYLEPHRINE HCL 10 MG/ML IJ SOLN
INTRAMUSCULAR | Status: AC
  Filled 2016-10-12: qty 1

## 2016-10-12 MED ORDER — LIDOCAINE HCL 2 % IJ SOLN
INTRAMUSCULAR | Status: AC
  Filled 2016-10-12: qty 10

## 2016-10-12 MED ORDER — EPHEDRINE SULFATE 50 MG/ML IJ SOLN
INTRAMUSCULAR | Status: AC
  Filled 2016-10-12: qty 1

## 2016-10-12 MED ORDER — PROPOFOL 500 MG/50ML IV EMUL
INTRAVENOUS | Status: DC | PRN
  Administered 2016-10-12: 150 ug/kg/min via INTRAVENOUS

## 2016-10-12 MED ORDER — PROPOFOL 10 MG/ML IV BOLUS
INTRAVENOUS | Status: DC | PRN
  Administered 2016-10-12: 80 mg via INTRAVENOUS

## 2016-10-12 MED ORDER — GLYCOPYRROLATE 0.2 MG/ML IJ SOLN
INTRAMUSCULAR | Status: DC | PRN
  Administered 2016-10-12: 0.2 mg via INTRAVENOUS

## 2016-10-12 NOTE — Op Note (Signed)
[facility] G[facility]enter[facility] Patient Name: Virginia ShutterKelly Peterson Procedure Date: 10/12/2016 7:51 AM MRN: [id number] Account #: [id number] Date of Birth: 1976-05-10 Admit Type: Outpatient Age: [age] Room: [facility] ENDO ROOM 4 Gender: Female Note Status: Finalized Procedure:            Upper GI endoscopy Indications:          Epigastric abdominal pain, Nausea with vomiting Providers:            Wyline MoodKiran Ralynn San MD, MD Referring MD:         Duane LopeJeffrey D. Judithann SheenSparks, MD (Referring MD) Medicines:            Monitored Anesthesia Care Complications:        No immediate complications. Procedure:            Pre-Anesthesia Assessment:                       - Prior to the procedure, a History and Physical was                        performed, and patient medications, allergies and                        sensitivities were reviewed. The patient's tolerance of                        previous anesthesia was reviewed.                       - The risks and benefits of the procedure and the                        sedation options and risks were discussed with the                        patient. All questions were answered and informed                        consent was obtained.                       - ASA Grade Assessment: III - A patient with severe                        systemic disease.                       After obtaining informed consent, the endoscope was                        passed under direct vision. Throughout the procedure,                        the patient's blood pressure, pulse, and oxygen                        saturations were monitored continuously. The Endoscope                        was introduced through the mouth, and advanced to the  third part of duodenum. The upper GI endoscopy was                        accomplished with ease. The patient tolerated the                        procedure well. Findings:      The esophagus was normal.      The examined  duodenum was normal. Biopsies for histology were taken with       a cold forceps for evaluation of celiac disease.      The entire examined stomach was normal. Biopsies were taken with a cold       forceps for histology. Impression:           - Normal esophagus.                       - Normal examined duodenum. Biopsied.                       - Normal stomach. Biopsied. Recommendation:       - Await pathology results.                       - Discharge patient to home (with escort).                       - Do a gastric emptying study in 1 week.                       - Await pathology results.                       - Return to my office in 3 weeks. Procedure Code(s):    --- Professional ---                       [id number], Esophagogastroduodenoscopy, flexible, transoral;                        with biopsy, single or multiple Diagnosis Code(s):    --- Professional ---                       R10.13, Epigastric pain                       R11.2, Nausea with vomiting, unspecified CPT copyright 2016 American Medical Association. All rights reserved. The codes documented in this report are preliminary and upon coder review may  be revised to meet current compliance requirements. Wyline Mood, MD Wyline Mood MD, MD 10/12/2016 8:06:42 AM This report has been signed electronically. Number of Addenda: 0 Note Initiated On: 10/12/2016 7:51 AM      [facility]

## 2016-10-12 NOTE — Transfer of Care (Signed)
Immediate Anesthesia Transfer of Care Note  Patient: Virginia ShutterKelly Swindell  Procedure(s) Performed: Procedure(s): ESOPHAGOGASTRODUODENOSCOPY (EGD) WITH PROPOFOL (N/A)  Patient Location: PACU and Endoscopy Unit  Anesthesia Type:General  Level of Consciousness: sedated  Airway & Oxygen Therapy: Patient Spontanous Breathing and Patient connected to nasal cannula oxygen  Post-op Assessment: Report given to RN and Post -op Vital signs reviewed and stable  Post vital signs: Reviewed and stable  Last Vitals:  Vitals:   10/12/16 0715 10/12/16 0814  BP: (!) 154/82 134/80  Pulse: 79 83  Resp: 16 17  Temp: 37 C     Complications: No apparent anesthesia complications

## 2016-10-12 NOTE — Anesthesia Post-op Follow-up Note (Cosign Needed)
Anesthesia QCDR form completed.        

## 2016-10-12 NOTE — H&P (Signed)
Wyline MoodKiran Idaliz Tinkle MD [address] [place], [place] [address] Phone: [phone] Fax : [phone]  Primary Care Physician:  Marguarite ArbourSparks, Jeffrey D, MD Primary Gastroenterologist:  Dr. Wyline MoodKiran Romelo Sciandra   Pre-Procedure History & Physical: HPI:  Virginia ShutterKelly Bachus is a 41 y.o. female is here for an endoscopy.   Past Medical History:  Diagnosis Date  . Anxiety   . Asthma   . Depressed   . Diabetes mellitus without complication (HCC)   . Dysthymia 2016  . Hyperlipidemia   . Hypertension     Past Surgical History:  Procedure Laterality Date  . benign neck tumor  2012  . CESAREAN SECTION    . NECK SURGERY      Prior to Admission medications   Medication Sig Start Date End Date Taking? Authorizing Provider  lisinopril (PRINIVIL,ZESTRIL) 10 MG tablet TK 1 T PO  BID. 08/13/16  Yes [provider]  albuterol (PROVENTIL HFA;VENTOLIN HFA) 108 (90 BASE) MCG/ACT inhaler Inhale 2 puffs into the lungs every 6 (six) hours as needed for wheezing or shortness of breath. 04/25/15   Tommi RumpsSummers, Rhonda L, PA-C  atorvastatin (LIPITOR) 20 MG tablet TK 1 T PO QD 08/23/16   [provider]  escitalopram (LEXAPRO) 10 MG tablet Take by mouth.    [provider]  fluticasone (FLONASE) 50 MCG/ACT nasal spray SHAKE LQ AND U 2 SPRAYS IEN QD PRF RHINITIS OR ALLERGIES 08/14/16   [provider]  glipiZIDE (GLUCOTROL) 10 MG tablet Take 1 tablet (10 mg total) by mouth 2 (two) times daily. 06/13/16 07/13/16  Jene EveryKinner, Robert, MD  HYDROcodone-acetaminophen (NORCO) 5-325 MG tablet Take 1 tablet by mouth every 4 (four) hours as needed for moderate pain. 10/10/16   Willy Eddyobinson, Patrick, MD  ipratropium-albuterol (DUONEB) 0.5-2.5 (3) MG/3ML SOLN Inhale into the lungs. 07/20/16 07/15/17  [provider]  lisinopril (PRINIVIL,ZESTRIL) 10 MG tablet Take 1 tablet (10 mg total) by mouth daily. 06/13/16 07/13/16  Jene EveryKinner, Robert, MD  LORazepam (ATIVAN) 1 MG tablet Take 1 tablet (1 mg total) by mouth 2 (two)  times daily as needed for anxiety. 07/28/16   Jennye MoccasinQuigley, Brian S, MD  meloxicam (MOBIC) 15 MG tablet Take 1 tablet (15 mg total) by mouth daily. Patient not taking: Reported on 09/03/2016 09/21/15   Kem Boroughsriplett, Cari B, FNP  metFORMIN (GLUCOPHAGE) 1000 MG tablet TK 1 T PO  BID 08/13/16   [provider]  omeprazole (PRILOSEC) 40 MG capsule Take 1 capsule (40 mg total) by mouth daily. 09/03/16   Wyline MoodAnna, Lavada Langsam, MD  oxyCODONE-acetaminophen (ROXICET) 5-325 MG tablet Take 1 tablet by mouth every 6 (six) hours as needed for moderate pain. 09/08/16   Joni ReiningSmith, Ronald K, PA-C    Allergies as of 09/26/2016 - Review Complete 09/22/2016  Allergen Reaction Noted  . Tramadol Itching 09/08/2016    Family History  Problem Relation Age of Onset  . Diabetes Mother   . Diabetes Father   . Diabetes Maternal Grandfather   . Diabetes Paternal Grandmother     Social History   Social History  . Marital status: Married    Spouse name: N/A  . Number of children: N/A  . Years of education: N/A   Occupational History  . Not on file.   Social History Main Topics  . Smoking status: Never Smoker  . Smokeless tobacco: Never Used  . Alcohol use No     Comment: occasional  . Drug use: No  . Sexual activity: Not on file   Other Topics Concern  .  Not on file   Social History Narrative  . No narrative on file    Review of Systems: See HPI, otherwise negative ROS  Physical Exam: BP (!) 154/82   Pulse 79   Temp 98.6 F (37 C) (Tympanic)   Resp 16   Ht 5\' 4"  (1.626 m)   Wt 235 lb (106.6 kg)   LMP 09/18/2016   SpO2 99%   BMI 40.34 kg/m  General:   Alert,  pleasant and cooperative in NAD Head:  Normocephalic and atraumatic. Neck:  Supple; no masses or thyromegaly. Lungs:  Clear throughout to auscultation.    Heart:  Regular rate and rhythm. Abdomen:  Soft, nontender and nondistended. Normal bowel sounds, without guarding, and without rebound.   Neurologic:  Alert and  oriented x4;  grossly normal  neurologically.  Impression/Plan: Virginia Peterson is here for an endoscopy to be performed for nausea, vomiting and abdominal pain   Risks, benefits, limitations, and alternatives regarding  endoscopy have been reviewed with the patient.  Questions have been answered.  All parties agreeable.   Wyline Mood, MD  10/12/2016, 7:53 AM

## 2016-10-12 NOTE — Anesthesia Preprocedure Evaluation (Signed)
Anesthesia Evaluation  Patient identified by MRN, date of birth, ID band Patient awake    Reviewed: Allergy & Precautions, H&P , NPO status , Patient's Chart, lab work & pertinent test results, reviewed documented beta blocker date and time   Airway Mallampati: II   Neck ROM: full    Dental  (+) Poor Dentition, Teeth Intact   Pulmonary neg shortness of breath, asthma ,    Pulmonary exam normal        Cardiovascular Exercise Tolerance: Good hypertension, On Medications negative cardio ROS Normal cardiovascular exam Rhythm:regular Rate:Normal     Neuro/Psych PSYCHIATRIC DISORDERS negative neurological ROS  negative psych ROS   GI/Hepatic negative GI ROS, Neg liver ROS,   Endo/Other  negative endocrine ROSdiabetes  Renal/GU negative Renal ROS  negative genitourinary   Musculoskeletal   Abdominal   Peds  Hematology negative hematology ROS (+)   Anesthesia Other Findings Past Medical History: No date: Anxiety No date: Asthma No date: Depressed No date: Diabetes mellitus without complication (HCC) 2016: Dysthymia No date: Hyperlipidemia No date: Hypertension Past Surgical History: 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 No date: CESAREAN SECTION No date: NECK SURGERY   Reproductive/Obstetrics negative OB ROS                             Anesthesia Physical Anesthesia Plan  ASA: II  Anesthesia Plan: General   Post-op Pain Management:    Induction:   Airway Management Planned:   Additional Equipment:   Intra-op Plan:   Post-operative Plan:   Informed Consent: I have reviewed the patients History and Physical, chart, labs and discussed the procedure including the risks, benefits and alternatives for the proposed anesthesia with the patient or authorized representative who has indicated his/her understanding and acceptance.   Dental Advisory Given  Plan Discussed with: CRNA  Anesthesia  Plan Comments:         Anesthesia Quick Evaluation

## 2016-10-12 NOTE — Anesthesia Procedure Notes (Signed)
Date/Time: 10/12/2016 7:57 AM Performed by: Stormy FabianURTIS, Eugenio Dollins Pre-anesthesia Checklist: Patient identified, Emergency Drugs available, Suction available and Patient being monitored Patient Re-evaluated:Patient Re-evaluated prior to inductionOxygen Delivery Method: Nasal cannula Intubation Type: IV induction Dental Injury: Teeth and Oropharynx as per pre-operative assessment  Comments: Nasal cannula with etCO2 monitoring

## 2016-10-15 ENCOUNTER — Encounter: Payer: Self-pay | Admitting: Gastroenterology

## 2016-10-15 LAB — SURGICAL PATHOLOGY

## 2016-10-16 ENCOUNTER — Telehealth: Payer: Self-pay

## 2016-10-16 NOTE — Telephone Encounter (Signed)
-----   Message from Wyline MoodKiran Anna, MD sent at 10/15/2016 12:48 PM EDT ----- Biopsies show acute duodenitis and gastritis- enquire if using any nsaids- if yes then stop, continue omeprazole 40 mg BID, follow up with me in clinic in a few weeks

## 2016-10-16 NOTE — Telephone Encounter (Signed)
Advised patient of results per Dr. Tobi BastosAnna.   Pt would like to move appointment date up.  Concerned about payment due to personal life changes.   Forwarded call to Engineering geologistscheduling secretary.

## 2016-10-17 NOTE — Anesthesia Postprocedure Evaluation (Signed)
Anesthesia Post Note  Patient: Virginia Peterson  Procedure(s) Performed: Procedure(s) (LRB): ESOPHAGOGASTRODUODENOSCOPY (EGD) WITH PROPOFOL (N/A)  Patient location during evaluation: PACU Anesthesia Type: General Level of consciousness: awake and alert Pain management: pain level controlled Vital Signs Assessment: post-procedure vital signs reviewed and stable Respiratory status: spontaneous breathing, nonlabored ventilation, respiratory function stable and patient connected to nasal cannula oxygen Cardiovascular status: blood pressure returned to baseline and stable Postop Assessment: no signs of nausea or vomiting Anesthetic complications: no     Last Vitals:  Vitals:   10/12/16 0824 10/12/16 0834  BP: 124/72 121/67  Pulse: 89 85  Resp: 17 16  Temp:      Last Pain:  Vitals:   10/12/16 0810  TempSrc: Tympanic                 Yevette EdwardsJames G Adams

## 2016-11-05 ENCOUNTER — Telehealth: Payer: Self-pay | Admitting: Gastroenterology

## 2016-11-05 NOTE — Telephone Encounter (Signed)
Contacted pt we lost signal during conversation.  Her [facility] and pharmacist said should still have about half month remaining.

## 2016-11-05 NOTE — Telephone Encounter (Signed)
[facility] [place] also wants to let you know since her EGD everything has been fine and sugar is good

## 2016-12-10 ENCOUNTER — Ambulatory Visit: Payer: BLUE CROSS/BLUE SHIELD | Admitting: Gastroenterology

## 2016-12-11 ENCOUNTER — Ambulatory Visit: Payer: BLUE CROSS/BLUE SHIELD | Admitting: Gastroenterology

## 2016-12-17 ENCOUNTER — Encounter: Payer: Self-pay | Admitting: Emergency Medicine

## 2016-12-17 ENCOUNTER — Emergency Department
Admission: EM | Admit: 2016-12-17 | Discharge: 2016-12-17 | Disposition: A | Discharge status: Home / Self Care | Payer: BLUE CROSS/BLUE SHIELD | Attending: Emergency Medicine | Admitting: Emergency Medicine

## 2016-12-17 DIAGNOSIS — Z7984 Long term (current) use of oral hypoglycemic drugs: Secondary | ICD-10-CM | POA: Diagnosis not present

## 2016-12-17 DIAGNOSIS — E119 Type 2 diabetes mellitus without complications: Secondary | ICD-10-CM | POA: Insufficient documentation

## 2016-12-17 DIAGNOSIS — R110 Nausea: Secondary | ICD-10-CM | POA: Diagnosis not present

## 2016-12-17 DIAGNOSIS — I10 Essential (primary) hypertension: Secondary | ICD-10-CM | POA: Insufficient documentation

## 2016-12-17 DIAGNOSIS — R197 Diarrhea, unspecified: Secondary | ICD-10-CM | POA: Diagnosis not present

## 2016-12-17 DIAGNOSIS — J45909 Unspecified asthma, uncomplicated: Secondary | ICD-10-CM | POA: Insufficient documentation

## 2016-12-17 DIAGNOSIS — K219 Gastro-esophageal reflux disease without esophagitis: Secondary | ICD-10-CM | POA: Diagnosis not present

## 2016-12-17 DIAGNOSIS — R1030 Lower abdominal pain, unspecified: Secondary | ICD-10-CM | POA: Diagnosis present

## 2016-12-17 LAB — COMPREHENSIVE METABOLIC PANEL
ALT: 19 U/L (ref 14–54)
AST: 18 U/L (ref 15–41)
Albumin: 4 g/dL (ref 3.5–5.0)
Alkaline Phosphatase: 55 U/L (ref 38–126)
Anion gap: 11 (ref 5–15)
BUN: 13 mg/dL (ref 6–20)
CO2: 23 mmol/L (ref 22–32)
CREATININE: 0.91 mg/dL (ref 0.44–1.00)
Calcium: 9 mg/dL (ref 8.9–10.3)
Chloride: 99 mmol/L — ABNORMAL LOW (ref 101–111)
GFR calc non Af Amer: >60 mL/min (ref >60)
Glucose, Bld: 230 mg/dL — ABNORMAL HIGH (ref 65–99)
Potassium: 3.9 mmol/L (ref 3.5–5.1)
SODIUM: 133 mmol/L — AB (ref 135–145)
Total Bilirubin: 0.5 mg/dL (ref 0.3–1.2)
Total Protein: 7.6 g/dL (ref 6.5–8.1)

## 2016-12-17 LAB — POCT PREGNANCY, URINE: PREG TEST UR: NEGATIVE

## 2016-12-17 LAB — CBC
HEMATOCRIT: 37.8 % (ref 35.0–47.0)
Hemoglobin: 12.6 g/dL (ref 12.0–16.0)
MCH: 27.9 pg (ref 26.0–34.0)
MCHC: 33.3 g/dL (ref 32.0–36.0)
MCV: 83.8 fL (ref 80.0–100.0)
PLATELETS: 414 10*3/uL (ref 150–440)
RBC: 4.52 MIL/uL (ref 3.80–5.20)
RDW: 13.6 % (ref 11.5–14.5)
WBC: 9.3 10*3/uL (ref 3.6–11.0)

## 2016-12-17 LAB — URINALYSIS, COMPLETE (UACMP) WITH MICROSCOPIC
BACTERIA UA: NONE SEEN
Bilirubin Urine: NEGATIVE
Ketones, ur: NEGATIVE mg/dL
Leukocytes, UA: NEGATIVE
Nitrite: NEGATIVE
PROTEIN: NEGATIVE mg/dL
Specific Gravity, Urine: 1.031 — ABNORMAL HIGH (ref 1.005–1.030)
pH: 6 (ref 5.0–8.0)

## 2016-12-17 LAB — LIPASE, BLOOD: LIPASE: 28 U/L (ref 11–51)

## 2016-12-17 MED ORDER — PROMETHAZINE HCL 25 MG PO TABS: 25.0000 mg | ORAL_TABLET | Freq: Four times a day (QID) | ORAL | 0 refills | Status: DC | PRN

## 2016-12-17 MED ORDER — ONDANSETRON HCL 4 MG/2ML IJ SOLN
4.0000 mg | Freq: Once | INTRAMUSCULAR | Status: AC
  Administered 2016-12-17: 4 mg via INTRAVENOUS

## 2016-12-17 MED ORDER — MORPHINE SULFATE (PF) 4 MG/ML IV SOLN
INTRAVENOUS | Status: AC
  Administered 2016-12-17: 4 mg via INTRAVENOUS
  Filled 2016-12-17: qty 1

## 2016-12-17 MED ORDER — OXYCODONE-ACETAMINOPHEN 5-325 MG PO TABS: 1.0000 | ORAL_TABLET | Freq: Three times a day (TID) | ORAL | 0 refills | Status: DC | PRN

## 2016-12-17 MED ORDER — MORPHINE SULFATE (PF) 4 MG/ML IV SOLN
4.0000 mg | Freq: Once | INTRAVENOUS | Status: AC
  Administered 2016-12-17: 4 mg via INTRAVENOUS

## 2016-12-17 MED ORDER — LOPERAMIDE HCL 2 MG PO CAPS
4.0000 mg | ORAL_CAPSULE | Freq: Once | ORAL | Status: AC
  Administered 2016-12-17: 4 mg via ORAL

## 2016-12-17 MED ORDER — MORPHINE SULFATE (PF) 4 MG/ML IV SOLN
4.0000 mg | Freq: Once | INTRAVENOUS | Status: AC
  Administered 2016-12-17: 4 mg via INTRAVENOUS
  Filled 2016-12-17: qty 1

## 2016-12-17 MED ORDER — GI COCKTAIL ~~LOC~~
30.0000 mL | Freq: Once | ORAL | Status: AC
  Administered 2016-12-17: 30 mL via ORAL
  Filled 2016-12-17: qty 30

## 2016-12-17 MED ORDER — ONDANSETRON HCL 4 MG/2ML IJ SOLN
INTRAMUSCULAR | Status: AC
Start: 2016-12-17 — End: 2016-12-17
  Administered 2016-12-17: 4 mg via INTRAVENOUS
  Filled 2016-12-17: qty 2

## 2016-12-17 MED ORDER — SODIUM CHLORIDE 0.9 % IV SOLN
Freq: Once | INTRAVENOUS | Status: AC
  Administered 2016-12-17: 19:00:00 via INTRAVENOUS

## 2016-12-17 MED ORDER — LOPERAMIDE HCL 2 MG PO CAPS
ORAL_CAPSULE | ORAL | Status: AC
  Administered 2016-12-17: 4 mg via ORAL
  Filled 2016-12-17: qty 2

## 2016-12-17 NOTE — ED Provider Notes (Signed)
[facility] Emergency Department Provider Note       Time seen: ----------------------------------------- 6:20 PM on 12/17/2016 -----------------------------------------     I have reviewed the triage vital signs and the nursing notes.   HISTORY   Chief Complaint Abdominal Pain and Nausea    HPI Virginia Peterson is a 41 y.o. female who presents to the ED for lower abdominal pain and nausea with diarrhea for the past several days. Patient reports she has not been taking her omeprazole for the past few days because she was out of town. Currently she does have her medications again. She is complaining of profuse diarrhea and feeling like she may be dehydrated. She reports she has lost weight during this diarrheal event over the last 5 days. She denies fevers, chills or other complaints.   Past Medical History:  Diagnosis Date  . Anxiety   . Asthma   . Depressed   . Diabetes mellitus without complication (HCC)   . Dysthymia 2016  . Hyperlipidemia   . Hypertension     Patient Active Problem List   Diagnosis Date Noted  . Asthma without status asthmaticus 07/20/2016  . Diabetes mellitus type 2, uncomplicated (HCC) 07/20/2016  . Disc disease, degenerative, lumbar or lumbosacral 07/20/2016  . Generalized anxiety disorder 07/20/2016  . Hyperlipidemia, unspecified 07/20/2016  . Hypertension 07/20/2016  . Pain syndrome, chronic 07/20/2016  . Depression 10/06/2015  . Adjustment disorder with mixed anxiety and depressed mood 10/06/2015    Past Surgical History:  Procedure Laterality Date  . benign neck tumor  2012  . CESAREAN SECTION    . ESOPHAGOGASTRODUODENOSCOPY (EGD) WITH PROPOFOL N/A 10/12/2016   Procedure: ESOPHAGOGASTRODUODENOSCOPY (EGD) WITH PROPOFOL;  Surgeon: Wyline Mood, MD;  Location: [facility] ENDOSCOPY;  Service: Endoscopy;  Laterality: N/A;  . NECK SURGERY      Allergies Tramadol  Social History Social History  Substance Use Topics  .  Smoking status: Never Smoker  . Smokeless tobacco: Never Used  . Alcohol use No     Comment: occasional    Review of Systems Constitutional: Negative for fever. Eyes: Negative for vision changes ENT:  Negative for congestion, sore throat Cardiovascular: Negative for chest pain. Respiratory: Negative for shortness of breath. Gastrointestinal: Positive for abdominal pain, diarrhea Genitourinary: Negative for dysuria. Musculoskeletal: Negative for back pain. Skin: Negative for rash. Neurological: Negative for headaches, focal weakness or numbness.  All systems negative/normal/unremarkable except as stated in the HPI  ____________________________________________   PHYSICAL EXAM:  VITAL SIGNS: ED Triage Vitals [12/17/16 1801]  Enc Vitals Group     BP 121/84     Pulse Rate 98     Resp 18     Temp 99 F (37.2 C)     Temp Source Oral     SpO2 98 %     Weight 228 lb (103.4 kg)     Height 5\' 4"  (1.626 m)     Head Circumference      Peak Flow      Pain Score 10     Pain Loc      Pain Edu?      Excl. in GC?     Constitutional: Alert and oriented. Well appearing and in no distress. Eyes: Conjunctivae are normal. Normal extraocular movements. ENT   Head: Normocephalic and atraumatic.   Nose: No congestion/rhinnorhea.   Mouth/Throat: Mucous membranes are moist.   Neck: No stridor. Cardiovascular: Normal rate, regular rhythm. No murmurs, rubs, or gallops. Respiratory: Normal respiratory effort without tachypnea  nor retractions. Breath sounds are clear and equal bilaterally. No wheezes/rales/rhonchi. Gastrointestinal: Soft and nontender. Normal bowel sounds Musculoskeletal: Nontender with normal range of motion in extremities. No lower extremity tenderness nor edema. Neurologic:  Normal speech and language. No gross focal neurologic deficits are appreciated.  Skin:  Skin is warm, dry and intact. No rash noted. Psychiatric: Mood and affect are normal. Speech and  behavior are normal.  ____________________________________________  ED COURSE:  Pertinent labs & imaging results that were available during my care of the patient were reviewed by me and considered in my medical decision making (see chart for details). Patient presents for Abdominal pain, nausea and diarrhea, we will assess with labs and imaging as indicated. Clinical Course as of Dec 18 2034  Mon Dec 17, 2016  1959 Labs are essentially unremarkable reflecting underlying viral etiology which is likely exacerbated previously known acid related duodenitis  [JW]    Clinical Course User Index [JW] Emily FilbertWilliams, Jonathan E, MD   Procedures ____________________________________________   LABS (pertinent positives/negatives)  Labs Reviewed  COMPREHENSIVE METABOLIC PANEL - Abnormal; Notable for the following:       Result Value   Sodium 133 (*)    Chloride 99 (*)    Glucose, Bld 230 (*)    All other components within normal limits  LIPASE, BLOOD  CBC  URINALYSIS, COMPLETE (UACMP) WITH MICROSCOPIC  POC URINE PREG, ED  POCT PREGNANCY, URINE   ____________________________________________  FINAL ASSESSMENT AND PLAN  Gastroenteritis  Plan: Patient's labs were dictated above. Patient had presented for Nausea and diarrhea which is likely viral. His viral illness is likely exacerbated pre-existing problem of duodenitis. She is encouraged to continue her omeprazole, I will prescribe antiemetics short supply pain medication. She is stable for outpatient follow-up. She was unable to provide a stool specimen for stool testing.   Emily FilbertWilliams, Jonathan E, MD   Note: This note was generated in part or whole with voice recognition software. Voice recognition is usually quite accurate but there are transcription errors that can and very often do occur. I apologize for any typographical errors that were not detected and corrected.     Emily FilbertWilliams, Jonathan E, MD 12/17/16 2039

## 2016-12-17 NOTE — ED Notes (Signed)
ED Provider at bedside. 

## 2016-12-17 NOTE — ED Triage Notes (Signed)
Pt reports lower abdominal pain and nausea for several days. Pt reports had not been taking her omeprazole for a few days because she was out of town.

## 2017-02-01 ENCOUNTER — Telehealth: Payer: Self-pay | Admitting: Gastroenterology

## 2017-02-01 ENCOUNTER — Other Ambulatory Visit: Payer: Self-pay

## 2017-02-01 DIAGNOSIS — K219 Gastro-esophageal reflux disease without esophagitis: Secondary | ICD-10-CM

## 2017-02-01 MED ORDER — OMEPRAZOLE 40 MG PO CPDR: 40.0000 mg | DELAYED_RELEASE_CAPSULE | Freq: Every day | ORAL | 1 refills | Status: DC

## 2017-02-01 NOTE — Telephone Encounter (Signed)
[facility] [place] [facility]  Patient needs a refill. She doesn't have any left. She had to cancel her appt for Tuesday due to being out of town for a funeral.

## 2017-02-05 ENCOUNTER — Ambulatory Visit: Payer: BLUE CROSS/BLUE SHIELD | Admitting: Gastroenterology

## 2017-02-18 ENCOUNTER — Other Ambulatory Visit: Payer: Self-pay

## 2017-02-18 ENCOUNTER — Telehealth: Payer: Self-pay | Admitting: Gastroenterology

## 2017-02-18 DIAGNOSIS — K219 Gastro-esophageal reflux disease without esophagitis: Secondary | ICD-10-CM

## 2017-02-18 MED ORDER — OMEPRAZOLE 40 MG PO CPDR: 40.0000 mg | DELAYED_RELEASE_CAPSULE | Freq: Two times a day (BID) | ORAL | 0 refills | Status: DC

## 2017-02-18 NOTE — Telephone Encounter (Signed)
Needs a refill for [facility] [place]. She needs the RX to say 2 x's a day. She stated she saw Dr. Tobi Bastos at the hospital and asked him about it. She also wanted you to do it ASAP

## 2017-02-22 ENCOUNTER — Encounter: Payer: Self-pay | Admitting: Emergency Medicine

## 2017-02-22 ENCOUNTER — Emergency Department
Admission: EM | Admit: 2017-02-22 | Discharge: 2017-02-22 | Disposition: A | Discharge status: Home / Self Care | Payer: BLUE CROSS/BLUE SHIELD | Attending: Emergency Medicine | Admitting: Emergency Medicine

## 2017-02-22 ENCOUNTER — Emergency Department: Payer: BLUE CROSS/BLUE SHIELD

## 2017-02-22 DIAGNOSIS — I10 Essential (primary) hypertension: Secondary | ICD-10-CM | POA: Insufficient documentation

## 2017-02-22 DIAGNOSIS — R197 Diarrhea, unspecified: Secondary | ICD-10-CM

## 2017-02-22 DIAGNOSIS — J45909 Unspecified asthma, uncomplicated: Secondary | ICD-10-CM | POA: Insufficient documentation

## 2017-02-22 DIAGNOSIS — R1010 Upper abdominal pain, unspecified: Secondary | ICD-10-CM

## 2017-02-22 DIAGNOSIS — R1012 Left upper quadrant pain: Secondary | ICD-10-CM | POA: Diagnosis present

## 2017-02-22 DIAGNOSIS — E119 Type 2 diabetes mellitus without complications: Secondary | ICD-10-CM | POA: Insufficient documentation

## 2017-02-22 LAB — URINALYSIS, COMPLETE (UACMP) WITH MICROSCOPIC
Bacteria, UA: NONE SEEN
Bilirubin Urine: NEGATIVE
Hgb urine dipstick: NEGATIVE
Ketones, ur: NEGATIVE mg/dL
Leukocytes, UA: NEGATIVE
Nitrite: NEGATIVE
PH: 5 (ref 5.0–8.0)
Protein, ur: 30 mg/dL — AB
SPECIFIC GRAVITY, URINE: 1.035 — AB (ref 1.005–1.030)

## 2017-02-22 LAB — CBC
HCT: 38.5 % (ref 35.0–47.0)
Hemoglobin: 13 g/dL (ref 12.0–16.0)
MCH: 27.5 pg (ref 26.0–34.0)
MCHC: 33.8 g/dL (ref 32.0–36.0)
MCV: 81.2 fL (ref 80.0–100.0)
PLATELETS: 422 10*3/uL (ref 150–440)
RBC: 4.74 MIL/uL (ref 3.80–5.20)
RDW: 14.5 % (ref 11.5–14.5)
WBC: 12 10*3/uL — ABNORMAL HIGH (ref 3.6–11.0)

## 2017-02-22 LAB — COMPREHENSIVE METABOLIC PANEL
ALK PHOS: 56 U/L (ref 38–126)
ALT: 53 U/L (ref 14–54)
AST: 33 U/L (ref 15–41)
Albumin: 4.2 g/dL (ref 3.5–5.0)
Anion gap: 10 (ref 5–15)
BUN: 11 mg/dL (ref 6–20)
CALCIUM: 9.1 mg/dL (ref 8.9–10.3)
CO2: 25 mmol/L (ref 22–32)
CREATININE: 0.73 mg/dL (ref 0.44–1.00)
Chloride: 101 mmol/L (ref 101–111)
GFR calc non Af Amer: >60 mL/min (ref >60)
GLUCOSE: 108 mg/dL — AB (ref 65–99)
Potassium: 4.1 mmol/L (ref 3.5–5.1)
SODIUM: 136 mmol/L (ref 135–145)
Total Bilirubin: 0.4 mg/dL (ref 0.3–1.2)
Total Protein: 7.7 g/dL (ref 6.5–8.1)

## 2017-02-22 LAB — POCT PREGNANCY, URINE: Preg Test, Ur: NEGATIVE

## 2017-02-22 LAB — LIPASE, BLOOD: LIPASE: 38 U/L (ref 11–51)

## 2017-02-22 MED ORDER — SODIUM CHLORIDE 0.9 % IV BOLUS (SEPSIS)
1000.0000 mL | Freq: Once | INTRAVENOUS | Status: AC
  Administered 2017-02-22: 1000 mL via INTRAVENOUS

## 2017-02-22 MED ORDER — LOPERAMIDE HCL 2 MG PO TABS: 2.0000 mg | ORAL_TABLET | Freq: Four times a day (QID) | ORAL | 0 refills | Status: AC | PRN

## 2017-02-22 MED ORDER — ONDANSETRON HCL 4 MG/2ML IJ SOLN
4.0000 mg | Freq: Once | INTRAMUSCULAR | Status: AC
  Administered 2017-02-22: 4 mg via INTRAVENOUS
  Filled 2017-02-22: qty 2

## 2017-02-22 MED ORDER — HYDROMORPHONE HCL 1 MG/ML IJ SOLN
0.5000 mg | Freq: Once | INTRAMUSCULAR | Status: AC
  Administered 2017-02-22: 0.5 mg via INTRAVENOUS
  Filled 2017-02-22: qty 1

## 2017-02-22 MED ORDER — IOPAMIDOL (ISOVUE-300) INJECTION 61%
100.0000 mL | Freq: Once | INTRAVENOUS | Status: AC | PRN
Start: 2017-02-22 — End: 2017-02-22
  Administered 2017-02-22: 100 mL via INTRAVENOUS
  Filled 2017-02-22: qty 100

## 2017-02-22 MED ORDER — IOPAMIDOL (ISOVUE-300) INJECTION 61%
30.0000 mL | Freq: Once | INTRAVENOUS | Status: AC
  Administered 2017-02-22: 30 mL via ORAL
  Filled 2017-02-22: qty 30

## 2017-02-22 NOTE — Discharge Instructions (Signed)
Please take a bland diet to decrease diarrhea.  You may also take Loperamide as prescribed for diarrhea.  Return to the emergency department for severe pain, fever, lightheadedness or fainting, or for any other symptoms concerning to you.

## 2017-02-22 NOTE — ED Triage Notes (Signed)
Patient presents to the ED with lower abdominal pain and diarrhea that began last night.  Patient reports more than 10 episodes of diarrhea in the past 24 hours.  Patient reports these symptoms are similar to when she had an ovarian cyst several months ago.  Patient is in no obvious distress at this time.

## 2017-02-22 NOTE — ED Notes (Signed)
Patient transported to CT 

## 2017-02-22 NOTE — ED Notes (Signed)
Pt aware we still need urine sample. Reports she hasnt been able to pee yet. Water was given

## 2017-02-22 NOTE — ED Provider Notes (Signed)
[facility] Emergency Department Provider Note  ____________________________________________  Time seen: Approximately 3:41 PM  I have reviewed the triage vital signs and the nursing notes.   HISTORY  Chief Complaint Abdominal Pain and Diarrhea    HPI Virginia Peterson is a 41 y.o. female with a history of morbid obesity, DM, HTN, HL, presenting with upper abdominal pain and diarrhea. The patient reportsthat over the past day, she has developedabdominal cramping which is worse in the upper abdomen particularly the left upper quadrant. This is been associated with multiple episodes of nonbloody loose stool. Several months ago, she had a similar episode, and was told she had a ruptured ovarian cyst. The patient denies any significant pelvic pain, change in vaginal discharge, or urinary symptoms. She has not had fever or chills. Positive nausea but no vomiting. She notes that her abdominal pain was worse with eating. She did not try any medications for her pain.no sick contacts, travel outside denies states, or recent antibiotic use.   Past Medical History:  Diagnosis Date  . Anxiety   . Asthma   . Depressed   . Diabetes mellitus without complication (HCC)   . Dysthymia 2016  . Hyperlipidemia   . Hypertension     Patient Active Problem List   Diagnosis Date Noted  . Morbid obesity with BMI of 40.0-44.9, adult (HCC) 10/03/2016  . Asthma without status asthmaticus 07/20/2016  . Diabetes mellitus type 2, uncomplicated (HCC) 07/20/2016  . Disc disease, degenerative, lumbar or lumbosacral 07/20/2016  . Generalized anxiety disorder 07/20/2016  . Hyperlipidemia, unspecified 07/20/2016  . Hypertension 07/20/2016  . Pain syndrome, chronic 07/20/2016  . Depression 10/06/2015  . Adjustment disorder with mixed anxiety and depressed mood 10/06/2015    Past Surgical History:  Procedure Laterality Date  . benign neck tumor  2012  . CESAREAN SECTION    .  ESOPHAGOGASTRODUODENOSCOPY (EGD) WITH PROPOFOL N/A 10/12/2016   Procedure: ESOPHAGOGASTRODUODENOSCOPY (EGD) WITH PROPOFOL;  Surgeon: Wyline Mood, MD;  Location: [facility] ENDOSCOPY;  Service: Endoscopy;  Laterality: N/A;  . NECK SURGERY      Current Outpatient Rx  . Order #: [id number] Class: Print  . Order #: [id number] Class: Historical Med  . Order #: [id number] Class: Historical Med  . Order #: [id number] Class: Historical Med  . Order #: [id number] Class: Historical Med  . Order #: [id number] Class: Historical Med  . Order #: [id number] Class: Historical Med  . Order #: [id number] Class: Historical Med  . Order #: [id number] Class: Historical Med  . Order #: [id number] Class: Print  . Order #: [id number] Class: Historical Med  . Order #: [id number] Class: Print  . Order #: [id number] Class: Historical Med  . Order #: [id number] Class: Print  . Order #: [id number] Class: Historical Med  . Order #: [id number] Class: Print  . Order #: [id number] Class: Print  . Order #: [id number] Class: Print  . Order #: [id number] Class: Historical Med  . Order #: [id number] Class: Normal  . Order #: [id number] Class: Print  . Order #: [id number] Class: Print  . Order #: [id number] Class: Historical Med  . Order #: [id number] Class: Print  . Order #: [id number] Class: Historical Med  . Order #: [id number] Class: Historical Med    Allergies Tramadol  Family History  Problem Relation Age of Onset  . Diabetes Mother   . Diabetes Father   . Diabetes Maternal Grandfather   . Diabetes Paternal Grandmother     Social History Social History  Substance Use Topics  . Smoking status: Never Smoker  . Smokeless tobacco: Never Used  . Alcohol use No  Comment: occasional    Review of Systems Constitutional: No fever/chills.no lightheadedness or syncope. Eyes: No visual changes. ENT: No sore throat. No congestion or rhinorrhea. Cardiovascular: Denies chest pain. Denies palpitations. Respiratory: Denies shortness of breath.  No  cough. Gastrointestinal: positive upperabdominal pain.  positivenausea, no vomiting.  positivediarrhea.  No constipation. Genitourinary: Negative for dysuria.negative for change in vaginal discharge Musculoskeletal: Negative for back pain. Skin: Negative for rash. Neurological: Negative for headaches. No focal numbness, tingling or weakness.     ____________________________________________   PHYSICAL EXAM:  VITAL SIGNS: ED Triage Vitals  Enc Vitals Group     BP 02/22/17 1338 137/86     Pulse Rate 02/22/17 1338 88     Resp 02/22/17 1338 18     Temp 02/22/17 1338 98.1 F (36.7 C)     Temp Source 02/22/17 1338 Oral     SpO2 02/22/17 1338 99 %     Weight 02/22/17 1340 223 lb (101.2 kg)     Height 02/22/17 1340  (1.626 m)     Head Circumference --      Peak Flow --      Pain Score 02/22/17 1339 9     Pain Loc --      Pain Edu? --      Excl. in GC? --     Constitutional: Alert and oriented. Well appearing and in no acute distress. Answers questions appropriately. Eyes: Conjunctivae are normal.  EOMI. No scleral icterus. Head: Atraumatic. Nose: No congestion/rhinnorhea. Mouth/Throat: Mucous membranes are moist.  Neck: No stridor.  Supple.   Cardiovascular: Normal rate, regular rhythm. No murmurs, rubs or gallops.  Respiratory: Normal respiratory effort.  No accessory muscle use or retractions. Lungs CTAB.  No wheezes, rales or ronchi. Gastrointestinal: Soft, and nondistended.  Diffuse tenderness to palpation in the upper abdomen, worst in the left upper quadrant. Negative Murphy sign. No guarding or rebound.  No peritoneal signs. Genitourinary: deferred as the patient is not having any pelvic pain, vaginal symptoms. Musculoskeletal: No LE edema.  Neurologic:  A&Ox3.  Speech is clear.  Face and smile are symmetric.  EOMI.  Moves all extremities well. Skin:  Skin is warm, dry and intact. No rash noted. Psychiatric: Mood and affect are normal. Speech and behavior are normal.   Normal judgement.  ____________________________________________   LABS (all labs ordered are listed, but only abnormal results are displayed)  Labs Reviewed  COMPREHENSIVE METABOLIC PANEL - Abnormal; Notable for the following:       Result Value   Glucose, Bld 108 (*)    All other components within normal limits  CBC - Abnormal; Notable for the following:    WBC 12.0 (*)    All other components within normal limits  URINALYSIS, COMPLETE (UACMP) WITH MICROSCOPIC - Abnormal; Notable for the following:    Color, Urine YELLOW (*)    APPearance CLEAR (*)    Specific Gravity, Urine 1.035 (*)    Glucose, UA >=500 (*)    Protein, ur 30 (*)    Squamous Epithelial / LPF 0-5 (*)    All other components within normal limits  LIPASE, BLOOD  POCT PREGNANCY, URINE  POC URINE PREG, ED   ____________________________________________  EKG  ED ECG REPORT I, Rockne Menghini, the attending physician, personally viewed and interpreted this ECG.   Date: 02/22/2017  EKG Time: 1554  Rate: 77  Rhythm: normal sinus rhythm  Axis: normal  Intervals:none  ST&T Change: No STEMI  ____________________________________________  RADIOLOGY  Ct  Abdomen Pelvis W Contrast  Result Date: 02/22/2017 CLINICAL DATA:  Acute lower abdominal pain, decreased appetite, diarrhea EXAM: CT ABDOMEN AND PELVIS WITH CONTRAST TECHNIQUE: Multidetector CT imaging of the abdomen and pelvis was performed using the standard protocol following bolus administration of intravenous contrast. CONTRAST:  ISOVUE-300 IOPAMIDOL (ISOVUE-300) INJECTION 61% COMPARISON:  01/08/2016 FINDINGS: Lower chest: No acute abnormality. Hepatobiliary: No focal liver abnormality is seen. No gallstones, gallbladder wall thickening, or biliary dilatation. Pancreas: Unremarkable. No pancreatic ductal dilatation or surrounding inflammatory changes. Spleen: Normal in size without focal abnormality. Adrenals/Urinary Tract: Small 10 mm hypodense  left adrenal nodule, compatible with an adenoma. Right adrenal gland unremarkable. Punctate nonobstructing right intrarenal calculus in the upper pole, image 36. No renal obstruction, hydronephrosis, hydroureter, or obstructing ureteral calculus. Urinary bladder unremarkable. Stomach/Bowel: Stomach is within normal limits. Appendix appears normal. No evidence of bowel wall thickening, distention, or inflammatory changes. Vascular/Lymphatic: No significant vascular findings are present. No enlarged abdominal or pelvic lymph nodes. Reproductive: Uterus and bilateral adnexa are unremarkable. Other: No abdominal wall hernia or abnormality. No abdominopelvic ascites. Musculoskeletal: No acute or significant osseous findings. IMPRESSION: No acute intra-abdominal or pelvic finding. Stable small left adrenal adenoma Punctate nonobstructing right nephrolithiasis Normal appendix Electronically Signed   By: Judie Petit.  Shick M.D.   On: 02/22/2017 17:05    ____________________________________________   PROCEDURES  Procedure(s) performed: None  Procedures  Critical Care performed: No ____________________________________________   INITIAL IMPRESSION / ASSESSMENT AND PLAN / ED COURSE  Pertinent labs & imaging results that were available during my care of the patient were reviewed by me and considered in my medical decision making (see chart for details).  41 y.o. female w/ a hx of ovarian cyst presenting w/ LUQ pain and diarrhea.  The patient does state this is similar to prior ovarian cyst rupture, but with upper abdominal pain, there are other etiologies we will explore first, includ ing diverticutis,or GI illness.we'll get a CT of the abdomen, and initiate symptomatic treatment, and reevaluate the patient for final disposition.  ----------------------------------------- 5:12 PM on 02/22/2017 -----------------------------------------  The patient's workup in the emergency department is reassuring. Her  pregnancy test is negative and she does not have a UTI. Her electrolytes are reassuring. Her CT scan does show any acute causes for her pain. I will plan to discharge her home with medication for her diarrhea, and  Repeat abdominal examination and close follower primary care physician in the next 1-2 days. Return precautions were discussed.  ____________________________________________  FINAL CLINICAL IMPRESSION(S) / ED DIAGNOSES  Final diagnoses:  Diarrhea, unspecified type  Upper abdominal pain         NEW MEDICATIONS STARTED DURING THIS VISIT:  New Prescriptions   LOPERAMIDE (IMODIUM A-D) 2 MG TABLET    Take 1 tablet (2 mg total) by mouth 4 (four) times daily as needed for diarrhea or loose stools.      Rockne Menghini, MD 02/22/17 [phone]

## 2017-03-08 ENCOUNTER — Encounter: Payer: Self-pay | Admitting: Obstetrics & Gynecology

## 2017-03-08 ENCOUNTER — Ambulatory Visit (INDEPENDENT_AMBULATORY_CARE_PROVIDER_SITE_OTHER): Payer: BLUE CROSS/BLUE SHIELD | Admitting: Obstetrics & Gynecology

## 2017-03-08 VITALS — BP 120/80 | Ht 64.0 in | Wt 126.0 lb

## 2017-03-08 DIAGNOSIS — R102 Pelvic and perineal pain: Secondary | ICD-10-CM

## 2017-03-08 DIAGNOSIS — G8929 Other chronic pain: Secondary | ICD-10-CM

## 2017-03-08 DIAGNOSIS — N83209 Unspecified ovarian cyst, unspecified side: Secondary | ICD-10-CM | POA: Diagnosis not present

## 2017-03-08 DIAGNOSIS — Z124 Encounter for screening for malignant neoplasm of cervix: Secondary | ICD-10-CM | POA: Diagnosis not present

## 2017-03-08 NOTE — Progress Notes (Signed)
Abdominal Pain Patient presents for evaluation of abdominal pain. The pain is described as colicky and cramping, and is 5/10 in intensity. Pain is located in the deep pelvis area and can radiate to RLQ or LLQ depending on the month. Onset was gradual occurring 4 months ago, although she has had these pains in the past as well. Symptoms have been unchanged since. Aggravating factors: menses. Dyspareunia but has sex very infrequently.  Alleviating factors: pain meds. Associated symptoms: none. The patient denies chills, constipation, diarrhea, fever and nausea. Risk factors for pelvic/abdominal pain include a histpry of ovarian cysts (2017, June 2018). Pt had CT and US showing cyst in 2017, and reports findings of the same in [place]  A few mos ago.  Recent CT at [facility] was normal. Pt has tried OCPs in past to help w pain and cysts with no relief after a few mos.  No desire for future fertility (has one child and one grandchild). Obesity but has lost a lot of weight over the past year.  PMHx: She  has a past medical history of Anxiety; Asthma; Depressed; Diabetes mellitus without complication (HCC); Dysthymia (2016); Hyperlipidemia; and Hypertension. Also,  has a past surgical history that includes Neck surgery; benign neck tumor (2012); Cesarean section; and Esophagogastroduodenoscopy (egd) with propofol (N/A, 10/12/2016)., family history includes Diabetes in her father, maternal grandfather, mother, and paternal grandmother.,  reports that she has never smoked. She has never used smokeless tobacco. She reports that she does not drink alcohol or use drugs.  She has a current medication list which includes the following prescription(s): albuterol, amoxicillin, atorvastatin, atorvastatin, cefdinir, escitalopram, escitalopram, fluconazole, fluticasone, glipizide, glipizide, hydrocodone-acetaminophen, ipratropium-albuterol, lisinopril, lisinopril, loperamide, lorazepam, meloxicam, metformin, omeprazole,  oxycodone-acetaminophen, oxycodone-acetaminophen, prednisone, promethazine, synjardy xr, and tramadol. Also, is allergic to tramadol.  Review of Systems  Constitutional: Negative for chills, fever and malaise/fatigue.  HENT: Negative for congestion, sinus pain and sore throat.   Eyes: Negative for blurred vision and pain.  Respiratory: Negative for cough and wheezing.   Cardiovascular: Negative for chest pain and leg swelling.  Gastrointestinal: Negative for abdominal pain, constipation, diarrhea, heartburn, nausea and vomiting.  Genitourinary: Negative for dysuria, frequency, hematuria and urgency.  Musculoskeletal: Negative for back pain, joint pain, myalgias and neck pain.  Skin: Negative for itching and rash.  Neurological: Negative for dizziness, tremors and weakness.  Endo/Heme/Allergies: Does not bruise/bleed easily.  Psychiatric/Behavioral: Negative for depression. The patient is not nervous/anxious and does not have insomnia.    Objective: BP 120/80   Ht  (1.626 m)   Wt 126 lb (57.2 kg)   LMP 02/10/2017 (Exact Date) Comment: neg preg test 02/22/17  BMI 21.63 kg/m  Physical Exam  Constitutional: She is oriented to person, place, and time. She appears well-developed and well-nourished. No distress.  Genitourinary: Rectum normal, vagina normal and uterus normal. Pelvic exam was performed with patient supine. There is no rash or lesion on the right labia. There is no rash or lesion on the left labia. Vagina exhibits no lesion. No bleeding in the vagina. Right adnexum does not display mass and does not display tenderness. Left adnexum does not display mass and does not display tenderness. Cervix does not exhibit motion tenderness, lesion, friability or polyp.   Uterus is mobile and midaxial. Uterus is not enlarged or exhibiting a mass.  Genitourinary Comments: Mild pain on exam but no mass  HENT:  Head: Normocephalic and atraumatic. Head is without laceration.  Right Ear:  Hearing normal.  Left Ear:  Hearing normal.  Nose: No epistaxis.  No foreign bodies.  Mouth/Throat: Uvula is midline, oropharynx is clear and moist and mucous membranes are normal.  Eyes: Pupils are equal, round, and reactive to light.  Neck: Normal range of motion. Neck supple. No thyromegaly present.  Cardiovascular: Normal rate and regular rhythm.  Exam reveals no gallop and no friction rub.   No murmur heard. Pulmonary/Chest: Effort normal and breath sounds normal. No respiratory distress. She has no wheezes. Right breast exhibits no mass, no skin change and no tenderness. Left breast exhibits no mass, no skin change and no tenderness.  Abdominal: Soft. Bowel sounds are normal. She exhibits no distension. There is no tenderness. There is no rebound.  Musculoskeletal: Normal range of motion.  Neurological: She is alert and oriented to person, place, and time. No cranial nerve deficit.  Skin: Skin is warm and dry.  Psychiatric: She has a normal mood and affect. Judgment normal.  Vitals reviewed.  ASSESSMENT/PLAN:    Visit Diagnoses    Chronic female pelvic pain    -  Primary   Relevant Orders   US PELVIS TRANSVANGINAL NON-OB (TV ONLY)   Cyst of ovary, unspecified laterality       Relevant Orders   US PELVIS TRANSVANGINAL NON-OB (TV ONLY)        Obesity  PAP today [place] soon Pelvic pain tx options d/w pt in detail.  Surgery, meds.  Risks of surgery.    [name], MD, [facility], [facility] 03/08/2017  11:16 AM

## 2017-03-11 LAB — IGP, APTIMA HPV
HPV Aptima: NEGATIVE
PAP Smear Comment: 0

## 2017-03-14 ENCOUNTER — Telehealth: Payer: Self-pay

## 2017-03-14 NOTE — Telephone Encounter (Signed)
Pt calling to see if [facility] can put in a rx for her for an antibx for abdominal pain.  Also, wants to know if we received her records.  [phone]

## 2017-03-14 NOTE — Telephone Encounter (Signed)
I told pt she would need to be seen, she still wanted me to ask about a antibiotic states she has pelvic pressure, and just started her period and it never feels like this,

## 2017-03-14 NOTE — Telephone Encounter (Signed)
Received records No reason for antibx Will f/u at next appt w her

## 2017-04-08 ENCOUNTER — Ambulatory Visit (INDEPENDENT_AMBULATORY_CARE_PROVIDER_SITE_OTHER): Payer: BLUE CROSS/BLUE SHIELD | Admitting: Obstetrics & Gynecology

## 2017-04-08 ENCOUNTER — Encounter: Payer: Self-pay | Admitting: Obstetrics & Gynecology

## 2017-04-08 ENCOUNTER — Ambulatory Visit (INDEPENDENT_AMBULATORY_CARE_PROVIDER_SITE_OTHER): Payer: BLUE CROSS/BLUE SHIELD

## 2017-04-08 VITALS — BP 120/70 | HR 73 | Ht 64.0 in | Wt 228.0 lb

## 2017-04-08 DIAGNOSIS — G8929 Other chronic pain: Secondary | ICD-10-CM

## 2017-04-08 DIAGNOSIS — R102 Pelvic and perineal pain: Secondary | ICD-10-CM | POA: Diagnosis not present

## 2017-04-08 DIAGNOSIS — N83209 Unspecified ovarian cyst, unspecified side: Secondary | ICD-10-CM | POA: Diagnosis not present

## 2017-04-08 DIAGNOSIS — G894 Chronic pain syndrome: Secondary | ICD-10-CM | POA: Diagnosis not present

## 2017-04-08 DIAGNOSIS — Z6841 Body Mass Index (BMI) 40.0 and over, adult: Secondary | ICD-10-CM

## 2017-04-08 MED ORDER — MELOXICAM 15 MG PO TABS: 15.0000 mg | ORAL_TABLET | Freq: Two times a day (BID) | ORAL | 3 refills | Status: DC | PRN

## 2017-04-08 MED ORDER — LEVONORGEST-ETH ESTRAD 91-DAY 0.15-0.03 &0.01 MG PO TABS: 1.0000 | ORAL_TABLET | Freq: Every day | ORAL | 4 refills | Status: DC

## 2017-04-08 NOTE — Patient Instructions (Signed)
Ethinyl Estradiol; Levonorgestrel tablets What is this medicine? ETHINYL ESTRADIOL; LEVONORGESTREL (ETH in il es tra DYE ole; LEE voh nor jes trel) is an oral contraceptive. It combines two types of female hormones, an estrogen and a progestin. They are used to prevent ovulation and pregnancy. This medicine may be used for other purposes; ask your health care provider or pharmacist if you have questions. COMMON BRAND NAME(S): Alesse, Altavera, Amethia, Amethia Lo, Amethyst, Ashlyna, Aubra-28, Aviane, Camrese, Camrese Lo, Chateal, Daysee, Delyla, Enpresse, FALMINA, Fayosin, Introvale, Isibloom, Jolessa, Kurvelo, Lessina, Levlen, Levlite, LEVONEST, Levonorgestrel/Ethinyl Estradiol, Levora, LoSeasonique, Lutera, Lybrel, MARLISSA, Myzilra, Nordette, Orsythia, Portia, Quartette, Quasense, Seasonale, Seasonique, Setlakin, Sronyx, Tri-Levlen, Triphasil, Trivora, Vienva What should I tell my health care provider before I take this medicine? They need to know if you have or ever had any of these conditions: -abnormal vaginal bleeding -blood vessel disease or blood clots -breast, cervical, endometrial, ovarian, liver, or uterine cancer -diabetes -gallbladder disease -heart disease or recent heart attack -high blood pressure -high cholesterol -kidney disease -liver disease -migraine headaches -stroke -systemic lupus erythematosus (SLE) -tobacco smoker -an unusual or allergic reaction to estrogens, progestins, other medicines, foods, dyes, or preservatives -pregnant or trying to get pregnant -breast-feeding How should I use this medicine? Take this medicine by mouth. To reduce nausea, this medicine may be taken with food. Follow the directions on the prescription label. Take this medicine at the same time each day and in the order directed on the package. Do not take your medicine more often than directed. Contact your pediatrician regarding the use of this medicine in children. Special care may be  needed. This medicine has been used in female children who have started having menstrual periods. A patient package insert for the product will be given with each prescription and refill. Read this sheet carefully each time. The sheet may change frequently. Overdosage: If you think you have taken too much of this medicine contact a poison control center or emergency room at once. NOTE: This medicine is only for you. Do not share this medicine with others. What if I miss a dose? If you miss a dose, refer to the patient information sheet you received with your medicine for direction. If you miss more than one pill, this medicine may not be as effective and you may need to use another form of birth control. What may interact with this medicine? Do not take this medicine with the following medication: -dasabuvir; ombitasvir; paritaprevir; ritonavir -ombitasvir; paritaprevir; ritonavir This medicine may also interact with the following medications: -acetaminophen -antibiotics or medicines for infections, especially rifampin, rifabutin, rifapentine, and griseofulvin, and possibly penicillins or tetracyclines -aprepitant -ascorbic acid (vitamin C) -atorvastatin -barbiturate medicines, such as phenobarbital -bosentan -carbamazepine -caffeine -clofibrate -cyclosporine -dantrolene -doxercalciferol -felbamate -grapefruit juice -hydrocortisone -medicines for anxiety or sleeping problems, such as diazepam or temazepam -medicines for diabetes, including pioglitazone -mineral oil -modafinil -mycophenolate -nefazodone -oxcarbazepine -phenytoin -prednisolone -ritonavir or other medicines for HIV infection or AIDS -rosuvastatin -selegiline -soy isoflavones supplements -St. John's wort -tamoxifen or raloxifene -theophylline -thyroid hormones -topiramate -warfarin This list may not describe all possible interactions. Give your health care provider a list of all the medicines, herbs,  non-prescription drugs, or dietary supplements you use. Also tell them if you smoke, drink alcohol, or use illegal drugs. Some items may interact with your medicine. What should I watch for while using this medicine? Visit your doctor or health care professional for regular checks on your progress. You will need a regular breast and pelvic   exam and Pap smear while on this medicine. Use an additional method of contraception during the first cycle that you take these tablets. If you have any reason to think you are pregnant, stop taking this medicine right away and contact your doctor or health care professional. If you are taking this medicine for hormone related problems, it may take several cycles of use to see improvement in your condition. Smoking increases the risk of getting a blood clot or having a stroke while you are taking birth control pills, especially if you are more than 41 years old. You are strongly advised not to smoke. This medicine can make your body retain fluid, making your fingers, hands, or ankles swell. Your blood pressure can go up. Contact your doctor or health care professional if you feel you are retaining fluid. This medicine can make you more sensitive to the sun. Keep out of the sun. If you cannot avoid being in the sun, wear protective clothing and use sunscreen. Do not use sun lamps or tanning beds/booths. If you wear contact lenses and notice visual changes, or if the lenses begin to feel uncomfortable, consult your eye care specialist. In some women, tenderness, swelling, or minor bleeding of the gums may occur. Notify your dentist if this happens. Brushing and flossing your teeth regularly may help limit this. See your dentist regularly and inform your dentist of the medicines you are taking. If you are going to have elective surgery, you may need to stop taking this medicine before the surgery. Consult your health care professional for advice. This medicine does not  protect you against HIV infection (AIDS) or any other sexually transmitted diseases. What side effects may I notice from receiving this medicine? Side effects that you should report to your doctor or health care professional as soon as possible: -breast tissue changes or discharge -changes in vaginal bleeding during your period or between your periods -chest pain -coughing up blood -dizziness or fainting spells -headaches or migraines -leg, arm or groin pain -severe or sudden headaches -stomach pain (severe) -sudden shortness of breath -sudden loss of coordination, especially on one side of the body -speech problems -symptoms of vaginal infection like itching, irritation or unusual discharge -tenderness in the upper abdomen -vomiting -weakness or numbness in the arms or legs, especially on one side of the body -yellowing of the eyes or skin Side effects that usually do not require medical attention (report to your doctor or health care professional if they continue or are bothersome): -breakthrough bleeding and spotting that continues beyond the 3 initial cycles of pills -breast tenderness -mood changes, anxiety, depression, frustration, anger, or emotional outbursts -increased sensitivity to sun or ultraviolet light -nausea -skin rash, acne, or brown spots on the skin -weight gain (slight) This list may not describe all possible side effects. Call your doctor for medical advice about side effects. You may report side effects to FDA at 1-800-FDA-1088. Where should I keep my medicine? Keep out of the reach of children. Store at room temperature between 15 and 30 degrees C (59 and 86 degrees F). Throw away any unused medicine after the expiration date. NOTE: This sheet is a summary. It may not cover all possible information. If you have questions about this medicine, talk to your doctor, pharmacist, or health care provider.  2018 Elsevier/Gold Standard (2016-01-30  07:58:22)]\  Meloxicam capsules What is this medicine? MELOXICAM (mel OX i cam) is a non-steroidal anti-inflammatory drug (NSAID). It is used to reduce swelling and to  treat pain. It is used for osteoarthritis. This medicine may be used for other purposes; ask your health care provider or pharmacist if you have questions. COMMON BRAND NAME(S): Vivlodex What should I tell my health care provider before I take this medicine? They need to know if you have any of these conditions: -bleeding disorders -cigarette smoker -coronary artery bypass graft (CABG) surgery within the past 2 weeks -drink more than 3 alcohol-containing drinks per day -heart disease -high blood pressure -history of stomach bleeding -kidney disease -liver disease -lung or breathing disease, like asthma -stomach or intestine problems -an unusual or allergic reaction to meloxicam, aspirin, other NSAIDs, other medicines, foods, dyes, or preservatives -pregnant or trying to get pregnant -breast-feeding How should I use this medicine? Take this medicine by mouth with a full glass of water. Follow the directions on the prescription label. You can take it with or without food. If it upsets your stomach, take it with food. Take your medicine at regular intervals. Do not take it more often than directed. Do not stop taking except on your doctor's advice. A special MedGuide will be given to you by the pharmacist with each prescription and refill. Be sure to read this information carefully each time. Talk to your pediatrician regarding the use of this medicine in children. Special care may be needed. Patients over 41 years old may have a stronger reaction and need a smaller dose. Overdosage: If you think you have taken too much of this medicine contact a poison control center or emergency room at once. NOTE: This medicine is only for you. Do not share this medicine with others. What if I miss a dose? If you miss a dose, take it as  soon as you can. If it is almost time for your next dose, take only that dose. Do not take double or extra doses. What may interact with this medicine? Do not take this medicine with any of the following medications: -cidofovir -ketorolac This medicine may also interact with the following medications: -aspirin and aspirin-like medicines -certain medicines for blood pressure, heart disease, irregular heart beat -certain medicines for depression, anxiety, or psychotic disturbances -certain medicines that treat or prevent blood clots like warfarin, enoxaparin, dalteparin, apixaban, dabigatran, rivaroxaban -cyclosporine -digoxin -diuretics -methotrexate -other NSAIDs, medicines for pain and inflammation, like ibuprofen and naproxen -pemetrexed This list may not describe all possible interactions. Give your health care provider a list of all the medicines, herbs, non-prescription drugs, or dietary supplements you use. Also tell them if you smoke, drink alcohol, or use illegal drugs. Some items may interact with your medicine. What should I watch for while using this medicine? Tell your doctor or healthcare professional if your symptoms do not start to get better or if they get worse. Do not take other medicines that contain aspirin, ibuprofen, or naproxen with this medicine. Side effects such as stomach upset, nausea, or ulcers may be more likely to occur. Many medicines available without a prescription should not be taken with this medicine. This medicine can cause ulcers and bleeding in the stomach and intestines at any time during treatment. This can happen with no warning and may cause death. There is increased risk with taking this medicine for a long time. Smoking, drinking alcohol, older age, and poor health can also increase risks. Call your doctor right away if you have stomach pain or blood in your vomit or stool. This medicine does not prevent heart attack or stroke. In fact, this medicine  may  increase the chance of a heart attack or stroke. The chance may increase with longer use of this medicine and in people who have heart disease. If you take aspirin to prevent heart attack or stroke, talk with your doctor or health care professional. What side effects may I notice from receiving this medicine? Side effects that you should report to your doctor or health care professional as soon as possible: -allergic reactions like skin rash, itching or hives, swelling of the face, lips, or tongue -nausea, vomiting -signs and symptoms of a blood clot such as breathing problems; changes in vision; chest pain; severe, sudden headache; pain, swelling, warmth in the leg; trouble speaking; sudden numbness or weakness of the face, arm, or leg -signs and symptoms of bleeding such as bloody or black, tarry stools; red or dark-brown urine; spitting up blood or brown material that looks like coffee grounds; red spots on the skin; unusual bruising or bleeding from the eye, gums, or nose -signs and symptoms of liver injury like dark yellow or brown urine; general ill feeling or flu-like symptoms; light-colored stools; loss of appetite; nausea; right upper belly pain; unusually weak or tired; yellowing of the eyes or skin -signs and symptoms of stroke like changes in vision; confusion; trouble speaking or understanding; severe headaches; sudden numbness or weakness of the face, arm, or leg; trouble walking; dizziness; loss of balance or coordination Side effects that usually do not require medical attention (report to your doctor or health care professional if they continue or are bothersome): -constipation -diarrhea -gas This list may not describe all possible side effects. Call your doctor for medical advice about side effects. You may report side effects to FDA at 1-800-FDA-1088. Where should I keep my medicine? Keep out of the reach of children. Store at room temperature between 15 and 30 degrees C (59  and 86 degrees F). Throw away any unused medicine after the expiration date. NOTE: This sheet is a summary. It may not cover all possible information. If you have questions about this medicine, talk to your doctor, pharmacist, or health care provider.  2018 Elsevier/Gold Standard (2015-06-23 10:31:18)

## 2017-04-08 NOTE — Progress Notes (Signed)
  HPI: Pain is esp before and during periods.  Heavy flow.  Also has occas h/a w periods.  Mood irritability.  Ultrasound demonstrates no masses seen, no cysts These findings are Pelvis normal  PMHx: She  has a past medical history of Anxiety, Asthma, Depressed, Diabetes mellitus without complication (HCC), Dysthymia (2016), Hyperlipidemia, and Hypertension. Also,  has a past surgical history that includes Neck surgery; benign neck tumor (2012); Cesarean section; and ESOPHAGOGASTRODUODENOSCOPY (EGD) WITH PROPOFOL (N/A, 10/12/2016)., family history includes Diabetes in her father, maternal grandfather, mother, and paternal grandmother.,  reports that  has never smoked. she has never used smokeless tobacco. She reports that she does not drink alcohol or use drugs.  She has a current medication list which includes the following prescription(s): albuterol, amoxicillin, atorvastatin, atorvastatin, cefdinir, escitalopram, escitalopram, fluconazole, fluticasone, glipizide, glipizide, hydrocodone-acetaminophen, ipratropium-albuterol, levonorgestrel-ethinyl estradiol, lisinopril, lisinopril, loperamide, lorazepam, meloxicam, meloxicam, metformin, omeprazole, oxycodone-acetaminophen, oxycodone-acetaminophen, prednisone, promethazine, synjardy xr, and tramadol. Also, is allergic to tramadol.  Review of Systems  All other systems reviewed and are negative.   Objective: BP 120/70   Pulse 73   Ht 5\' 4"  (1.626 m)   Wt 228 lb (103.4 kg)   LMP 04/07/2017   BMI 39.14 kg/m   Physical examination Constitutional NAD, Conversant  Skin No rashes, lesions or ulceration.   Extremities: Moves all appropriately.  Normal ROM for age. No lymphadenopathy.  Neuro: Grossly intact  Psych: Oriented to PPT.  Normal mood. Normal affect.   Assessment:  Morbid obesity with BMI of 40.0-44.9, adult (HCC)  Pain syndrome, chronic  Plan OCP extended, Meloxicam F/u 4 mos Surg options such as ablation even hyst discussed,  prefer med approach, unsure as to to pattern and chronicity yet of her sx's  A total of 15 minutes were spent face-to-face with the patient during this encounter and over half of that time dealt with counseling and coordination of care.   Annamarie MajorPaul Parker Wherley, MD, Merlinda FrederickFACOG [facility], [facility] 04/08/2017  2:23 PM

## 2017-04-15 ENCOUNTER — Telehealth: Payer: Self-pay

## 2017-04-15 NOTE — Telephone Encounter (Signed)
Pt asking for antibx for pressure down below, she is having pressure again and peeing a lot the last 2d.  [phone]

## 2017-04-15 NOTE — Telephone Encounter (Signed)
Pt aware she will need appt, pt aware  Huntley DecSara will  call and schedule

## 2017-04-16 ENCOUNTER — Ambulatory Visit: Payer: BLUE CROSS/BLUE SHIELD | Admitting: Obstetrics and Gynecology

## 2017-04-23 ENCOUNTER — Telehealth: Payer: Self-pay | Admitting: Pharmacy Technician

## 2017-04-23 NOTE — Telephone Encounter (Signed)
Patient has pharmacy coverage with [employer].  [facility] unable to provide medication assistance.  Sherilyn DacostaBetty J. Kluttz Care Manager [facility]

## 2017-04-24 ENCOUNTER — Telehealth: Payer: Self-pay | Admitting: Pharmacy Technician

## 2017-05-03 ENCOUNTER — Telehealth: Payer: Self-pay

## 2017-05-03 NOTE — Telephone Encounter (Signed)
Called pt no answer °

## 2017-05-03 NOTE — Telephone Encounter (Signed)
Hormonal sx's, may be from her taking new regimen.  Trade off as to her taking to treat other sx's of concern.  Would prefer her to continue with pill and manage mood and heat sx's for a while longer since they are not life threatening.  Not ready for surgery or other options as less likely to help right now.

## 2017-05-03 NOTE — Telephone Encounter (Signed)
Pt returning call. Cb#[phone]

## 2017-05-03 NOTE — Telephone Encounter (Signed)
Pt feels mood changes and feels hot and cold wants to know with is this pre menopausal symptoms or this [facility]? Please advise, only been experiencing  this for the past 2 weeks. States she cant take this feeling anymore

## 2017-05-03 NOTE — Telephone Encounter (Signed)
Pt requesting a cb to discuss unspecified questions. Cb#[phone]

## 2017-05-06 NOTE — Telephone Encounter (Signed)
Pt aware.

## 2017-05-06 NOTE — Telephone Encounter (Signed)
ok 

## 2017-05-06 NOTE — Telephone Encounter (Signed)
Pt still gets pain in middle of stomach pain goes goes to the left and right sides, no vaginal pain just stomach pain , not sure if its from something she ate, pt aware she may have have to discuss this pain with her pcp

## 2017-05-07 ENCOUNTER — Other Ambulatory Visit: Payer: Self-pay | Admitting: Gastroenterology

## 2017-05-07 DIAGNOSIS — K219 Gastro-esophageal reflux disease without esophagitis: Secondary | ICD-10-CM

## 2017-05-07 NOTE — Telephone Encounter (Signed)
Patient needs to have office visit prior to next refill.

## 2017-05-16 ENCOUNTER — Emergency Department
Admission: EM | Admit: 2017-05-16 | Discharge: 2017-05-16 | Disposition: A | Discharge status: Home / Self Care | Payer: BLUE CROSS/BLUE SHIELD | Attending: Emergency Medicine | Admitting: Emergency Medicine

## 2017-05-16 ENCOUNTER — Other Ambulatory Visit: Payer: Self-pay

## 2017-05-16 ENCOUNTER — Encounter: Payer: Self-pay | Admitting: Emergency Medicine

## 2017-05-16 DIAGNOSIS — E119 Type 2 diabetes mellitus without complications: Secondary | ICD-10-CM | POA: Diagnosis not present

## 2017-05-16 DIAGNOSIS — J45909 Unspecified asthma, uncomplicated: Secondary | ICD-10-CM | POA: Diagnosis not present

## 2017-05-16 DIAGNOSIS — F329 Major depressive disorder, single episode, unspecified: Secondary | ICD-10-CM | POA: Diagnosis not present

## 2017-05-16 DIAGNOSIS — R220 Localized swelling, mass and lump, head: Secondary | ICD-10-CM | POA: Diagnosis present

## 2017-05-16 DIAGNOSIS — F4323 Adjustment disorder with mixed anxiety and depressed mood: Secondary | ICD-10-CM | POA: Diagnosis not present

## 2017-05-16 DIAGNOSIS — J0100 Acute maxillary sinusitis, unspecified: Secondary | ICD-10-CM

## 2017-05-16 DIAGNOSIS — I10 Essential (primary) hypertension: Secondary | ICD-10-CM | POA: Diagnosis not present

## 2017-05-16 DIAGNOSIS — Z7984 Long term (current) use of oral hypoglycemic drugs: Secondary | ICD-10-CM | POA: Diagnosis not present

## 2017-05-16 DIAGNOSIS — F419 Anxiety disorder, unspecified: Secondary | ICD-10-CM | POA: Insufficient documentation

## 2017-05-16 DIAGNOSIS — Z79899 Other long term (current) drug therapy: Secondary | ICD-10-CM | POA: Insufficient documentation

## 2017-05-16 MED ORDER — PREDNISONE 10 MG PO TABS: 30.0000 mg | ORAL_TABLET | Freq: Every day | ORAL | 0 refills | Status: DC

## 2017-05-16 MED ORDER — AMOXICILLIN 500 MG PO CAPS: 500.0000 mg | ORAL_CAPSULE | Freq: Three times a day (TID) | ORAL | 0 refills | Status: DC

## 2017-05-16 MED ORDER — AMOXICILLIN 500 MG PO CAPS
500.0000 mg | ORAL_CAPSULE | Freq: Once | ORAL | Status: AC
  Administered 2017-05-16: 500 mg via ORAL
  Filled 2017-05-16: qty 1

## 2017-05-16 NOTE — ED Notes (Signed)
Pt states took mucinex 2 doses then had rt sided facial swelling. Took benadryl per advise from rehab nurse. No swelling noted. Has not taken anything other than the benadryl for her sinuses. States she is worried she has a tumor causing her so much pain. C/o sinus pressure and congestion worse at night.

## 2017-05-16 NOTE — Discharge Instructions (Signed)
Follow-up with your regular doctor if you are not better in 3-5 days, increase medication as prescribed, t use over-the-counter saline nose rinse, I would recommend you not take Mucinex if you have facial swelling with that, return to the emergency department if your worsening

## 2017-05-16 NOTE — ED Triage Notes (Signed)
Presents to ED with facial swelling to right side of face states sx's started after taking Muscinex  Denies any dental pain

## 2017-05-16 NOTE — ED Provider Notes (Signed)
[facility] Emergency Department Provider Note  ____________________________________________   First MD Initiated Contact with Patient 05/16/17 1614     (approximate)  I have reviewed the triage vital signs and the nursing notes.   HISTORY  Chief Complaint No chief complaint on file.    HPI Virginia Peterson is a 41 y.o. female complains of right-sided facial swelling, states she took some Mucinex 3 days ago and the area started to swell afterwards, she took Benadryl which helped swelling go down, she states that she has some swelling and pain in the area due to sinus pain and pressure, she denies fever or chills, she denies cough or congestion  Past Medical History:  Diagnosis Date  . Anxiety   . Asthma   . Depressed   . Diabetes mellitus without complication (HCC)   . Dysthymia 2016  . Hyperlipidemia   . Hypertension     Patient Active Problem List   Diagnosis Date Noted  . Morbid obesity with BMI of 40.0-44.9, adult (HCC) 10/03/2016  . Asthma without status asthmaticus 07/20/2016  . Diabetes mellitus type 2, uncomplicated (HCC) 07/20/2016  . Disc disease, degenerative, lumbar or lumbosacral 07/20/2016  . Generalized anxiety disorder 07/20/2016  . Hyperlipidemia, unspecified 07/20/2016  . Hypertension 07/20/2016  . Pain syndrome, chronic 07/20/2016  . Depression 10/06/2015  . Adjustment disorder with mixed anxiety and depressed mood 10/06/2015    Past Surgical History:  Procedure Laterality Date  . benign neck tumor  2012  . CESAREAN SECTION    . ESOPHAGOGASTRODUODENOSCOPY (EGD) WITH PROPOFOL N/A 10/12/2016   Procedure: ESOPHAGOGASTRODUODENOSCOPY (EGD) WITH PROPOFOL;  Surgeon: Wyline MoodAnna, Kiran, MD;  [facility];  Service: Endoscopy;  Laterality: N/A;  . NECK SURGERY      Prior to Admission medications   Medication Sig Start Date End Date Taking? Authorizing Provider  albuterol (PROVENTIL HFA;VENTOLIN HFA) 108 (90 BASE) MCG/ACT  inhaler Inhale 2 puffs into the lungs every 6 (six) hours as needed for wheezing or shortness of breath. 04/25/15   Tommi RumpsSummers, Rhonda L, PA-C  amoxicillin (AMOXIL) 500 MG capsule Take 1 capsule (500 mg total) by mouth 3 (three) times daily. 05/16/17   Tannah Dreyfuss, Roselyn BeringSusan W, PA-C  atorvastatin (LIPITOR) 20 MG tablet TK 1 T PO QD 08/23/16   [provider]  atorvastatin (LIPITOR) 20 MG tablet Take by mouth. 09/28/16 09/28/17  [provider]  escitalopram (LEXAPRO) 10 MG tablet Take by mouth.    [provider]  fluticasone (FLONASE) 50 MCG/ACT nasal spray SHAKE LQ AND U 2 SPRAYS IEN QD PRF RHINITIS OR ALLERGIES 08/14/16   [provider]  glipiZIDE (GLUCOTROL) 10 MG tablet Take 1 tablet (10 mg total) by mouth 2 (two) times daily. 06/13/16 07/13/16  Jene EveryKinner, Robert, MD  Levonorgestrel-Ethinyl Estradiol (AMETHIA,CAMRESE) 0.15-0.03 &0.01 MG tablet Take 1 tablet daily by mouth. 04/08/17   Nadara MustardHarris, Robert P, MD  lisinopril (PRINIVIL,ZESTRIL) 10 MG tablet Take 1 tablet (10 mg total) by mouth daily. 06/13/16 07/13/16  Jene EveryKinner, Robert, MD  lisinopril (PRINIVIL,ZESTRIL) 10 MG tablet TK 1 T PO  BID. 08/13/16   [provider]  loperamide (IMODIUM A-D) 2 MG tablet Take 1 tablet (2 mg total) by mouth 4 (four) times daily as needed for diarrhea or loose stools. 02/22/17   Rockne MenghiniNorman, Anne-Caroline, MD  LORazepam (ATIVAN) 1 MG tablet Take 1 tablet (1 mg total) by mouth 2 (two) times daily as needed for anxiety. 07/28/16   Jennye MoccasinQuigley, Brian S, MD  meloxicam (MOBIC) 15 MG tablet  Take 1 tablet (15 mg total) 2 (two) times daily as needed by mouth for pain. 04/08/17   Nadara MustardHarris, Robert P, MD  metFORMIN (GLUCOPHAGE) 1000 MG tablet TK 1 T PO  BID 08/13/16   [provider]  omeprazole (PRILOSEC) 40 MG capsule TAKE 1 CAPSULE BY MOUTH TWICE A DAY 05/07/17   Wyline MoodAnna, Kiran, MD  predniSONE (DELTASONE) 10 MG tablet Take 3 tablets (30 mg total) by mouth daily with breakfast. 05/16/17   Faythe GheeFisher, Samantha Ragen W, PA-C    SYNJARDY XR 12.10-998 MG TB24  10/10/16   [provider]    Allergies Tramadol  Family History  Problem Relation Age of Onset  . Diabetes Mother   . Diabetes Father   . Diabetes Maternal Grandfather   . Diabetes Paternal Grandmother     Social History Social History   Tobacco Use  . Smoking status: Never Smoker  . Smokeless tobacco: Never Used  Substance Use Topics  . Alcohol use: No    Comment: occasional  . Drug use: No    Review of Systems  Constitutional: No fever/chills Eyes: No visual changes. ENT: No sore throat.  Positive for sinus pain and swelling Respiratory: Denies cough Genitourinary: Negative for dysuria. Musculoskeletal: Negative for back pain. Skin: Negative for rash.    ____________________________________________   PHYSICAL EXAM:  VITAL SIGNS: ED Triage Vitals  Enc Vitals Group     BP 05/16/17 1601 134/63     Pulse Rate 05/16/17 1601 70     Resp 05/16/17 1601 16     Temp 05/16/17 1601 98.6 F (37 C)     Temp Source 05/16/17 1601 Oral     SpO2 05/16/17 1601 97 %     Weight 05/16/17 1601 220 lb (99.8 kg)     Height 05/16/17 1601 5\' 4"  (1.626 m)     Head Circumference --      Peak Flow --      Pain Score 05/16/17 1624 9     Pain Loc --      Pain Edu? --      Excl. in GC? --     Constitutional: Alert and oriented. Well appearing and in no acute distress. Eyes: Conjunctivae are normal.  Head: Atraumatic.  No facial swelling noted Nose: No congestion/rhinnorhea.  Nasal mucosa swollen Mouth/Throat: Mucous membranes are moist.   Cardiovascular: Normal rate, regular rhythm.  Heart sounds are normal Respiratory: Normal respiratory effort.  No retractions, lungs are clear to auscultation GU: deferred Musculoskeletal: FROM all extremities, warm and well perfused Neurologic:  Normal speech and language.  Skin:  Skin is warm, dry and intact. No rash noted. Psychiatric: Mood and affect are normal. Speech and behavior are  normal.  ____________________________________________   LABS (all labs ordered are listed, but only abnormal results are displayed)  Labs Reviewed - No data to display ____________________________________________   ____________________________________________  RADIOLOGY    ____________________________________________   PROCEDURES  Procedure(s) performed: No      ____________________________________________   INITIAL IMPRESSION / ASSESSMENT AND PLAN / ED COURSE  Pertinent labs & imaging results that were available during my care of the patient were reviewed by me and considered in my medical decision making (see chart for details).  Patient is 41 year old female who complains of right-sided facial pain and swelling, on exam she appears basically well, there is some sinus and nasal mucosa swelling, prescription for amoxicillin 500 mg 3 times a day was given prednisone 30 mg daily for 3 days for the  swelling, she is to follow-up with her regular doctor if she is not better in 3-5 days, she is to return to the emergency department if she is worsening      ____________________________________________   FINAL CLINICAL IMPRESSION(S) / ED DIAGNOSES  Final diagnoses:  Acute maxillary sinusitis, recurrence not specified      NEW MEDICATIONS STARTED DURING THIS VISIT:  This SmartLink is deprecated. Use AVSMEDLIST instead to display the medication list for a patient.   Note:  This document was prepared using Dragon voice recognition software and may include unintentional dictation errors.    Faythe Ghee, PA-C 05/16/17 1627    Schaevitz, Myra Rude, MD 05/16/17 [phone]

## 2017-06-10 ENCOUNTER — Telehealth: Payer: Self-pay | Admitting: Obstetrics & Gynecology

## 2017-06-10 NOTE — Telephone Encounter (Signed)
Pt is called about her prescription and possible side effects  Virginia Peterson .of spotting black discharge please advise.

## 2017-06-10 NOTE — Telephone Encounter (Signed)
lmtrc

## 2017-06-10 NOTE — Telephone Encounter (Signed)
Cont current hormone regimen, this is to be expected and is actually good results from bleeding standpoint

## 2017-06-10 NOTE — Telephone Encounter (Signed)
Pt states no period in December, but having cramps and black/brown blood when wipes, I told her it's probably old blood from where she didn't have a period but is now probably going to start a period. Please advise if I should tell her anything different.

## 2017-06-17 ENCOUNTER — Telehealth: Payer: Self-pay

## 2017-06-17 NOTE — Telephone Encounter (Signed)
Pt called triage line stating [facility] She is having pain and the same problem he has seen her for in the past. CB# [phone]

## 2017-06-17 NOTE — Telephone Encounter (Signed)
Called and lvm for pt to call back to be schedule °

## 2017-06-19 ENCOUNTER — Encounter: Payer: Self-pay | Admitting: Obstetrics & Gynecology

## 2017-06-19 ENCOUNTER — Ambulatory Visit (INDEPENDENT_AMBULATORY_CARE_PROVIDER_SITE_OTHER): Payer: BLUE CROSS/BLUE SHIELD | Admitting: Obstetrics & Gynecology

## 2017-06-19 VITALS — BP 130/80 | HR 68 | Ht 64.0 in | Wt 221.0 lb

## 2017-06-19 DIAGNOSIS — N938 Other specified abnormal uterine and vaginal bleeding: Secondary | ICD-10-CM

## 2017-06-19 MED ORDER — LEVONORGEST-ETH ESTRAD 91-DAY 0.15-0.03 &0.01 MG PO TABS: 1.0000 | ORAL_TABLET | Freq: Every day | ORAL | 4 refills | Status: DC

## 2017-06-19 NOTE — Progress Notes (Signed)
  History of Present Illness:  Virginia Peterson is a 42 y.o. who was started on  OCP approximately 3 months ago. Since that time, she states that her symptoms have improved w pain but this month has had 3 weeks of blackish bleeding. On OCP due to pain from dysmenorrhea; prior ovarian cysts but none recently seen on US.  PMHx: She  has a past medical history of Anxiety, Asthma, Depressed, Diabetes mellitus without complication (HCC), Dysthymia (2016), Hyperlipidemia, and Hypertension. Also,  has a past surgical history that includes Neck surgery; benign neck tumor (2012); Cesarean section; and Esophagogastroduodenoscopy (egd) with propofol (N/A, 10/12/2016)., family history includes Diabetes in her father, maternal grandfather, mother, and paternal grandmother.,  reports that  has never smoked. she has never used smokeless tobacco. She reports that she does not drink alcohol or use drugs. No outpatient medications have been marked as taking for the 06/19/17 encounter (Office Visit) with Nadara MustardHarris, Nikala Walsworth P, MD.  . Also, is allergic to tramadol..  Review of Systems  All other systems reviewed and are negative.  Physical Exam:  BP 130/80   Pulse 68   Ht 5\' 4"  (1.626 m)   Wt 221 lb (100.2 kg)   BMI 37.93 kg/m  Body mass index is 37.93 kg/m. Constitutional: Well nourished, well developed female in no acute distress.  Abdomen: diffusely non tender to palpation, non distended, and no masses, hernias Neuro: Grossly intact Psych:  Normal mood and affect.    Assessment:  Problem List Items Addressed This Visit    None    Visit Diagnoses    DUB (dysfunctional uterine bleeding)    -  Primary    Medication treatment is going adequately for her pain, and we will monitor this recent abnormal bleeding.  Plan: Option for hysterectomy for pain and bleeding abnormalities discussed. She was amenable to this plan and we will see her back for annual/PRN.  A total of 15 minutes were spent face-to-face with the  patient during this encounter and over half of that time dealt with counseling and coordination of care.   Annamarie MajorPaul Sivan Cuello, MD, Merlinda FrederickFACOG [facility], [facility] 06/19/2017  2:32 PM

## 2017-06-20 ENCOUNTER — Telehealth: Payer: Self-pay | Admitting: Obstetrics & Gynecology

## 2017-06-20 ENCOUNTER — Telehealth: Payer: Self-pay

## 2017-06-20 NOTE — Telephone Encounter (Signed)
[facility] @ 11:00am, Pre-admit Testing phone interview to be scheduled, and OR on 07/23/17. Patient is aware of insurance info according to [facility], as of today. Patient is not sure whether her policy runs on a calendar year and is aware that could affect amount she could owe. Ext given.

## 2017-06-20 NOTE — Telephone Encounter (Signed)
Can schedule surgery as available in schedule, w preop. Let her know.

## 2017-06-20 NOTE — Telephone Encounter (Signed)
-----   Message from Nadara Mustardobert P Harris, MD sent at 06/20/2017  3:36 PM EST ----- Regarding: surgery Ask her if its OK w her Mom to have surgery.  Really.  Surgery Booking Request Patient Full Name:  Virginia Peterson  MRN: [id number]  DOB: 08-May-1976  Surgeon: Letitia Libraobert Paul Harris, MD  Requested Surgery Date and Time: Any available Primary Diagnosis AND Code: Menometrorrhagia, Pelvic Pain (G89.4, N93.8, N92.1) Secondary Diagnosis and Code:  Surgical Procedure: TLH/BS L&D Notification: No Admission Status: same day surgery Length of Surgery: 1 Special Case Needs: no H&P: yes (date) Phone Interview???: yes Interpreter: Language:  Medical Clearance: no Special Scheduling Instructions: no

## 2017-06-20 NOTE — Telephone Encounter (Signed)
Pt calling to see if okay to go ahead and schedule surgery.  She is still bleeding and having pain and can't deal with it anymore.  [phone]

## 2017-06-20 NOTE — Telephone Encounter (Signed)
Pt aware.

## 2017-06-25 ENCOUNTER — Telehealth: Payer: Self-pay

## 2017-06-25 ENCOUNTER — Encounter: Payer: Self-pay | Admitting: Emergency Medicine

## 2017-06-25 ENCOUNTER — Emergency Department
Admission: EM | Admit: 2017-06-25 | Discharge: 2017-06-25 | Disposition: A | Discharge status: Home / Self Care | Payer: BLUE CROSS/BLUE SHIELD | Attending: Emergency Medicine | Admitting: Emergency Medicine

## 2017-06-25 ENCOUNTER — Other Ambulatory Visit: Payer: Self-pay

## 2017-06-25 DIAGNOSIS — J45909 Unspecified asthma, uncomplicated: Secondary | ICD-10-CM | POA: Diagnosis not present

## 2017-06-25 DIAGNOSIS — N946 Dysmenorrhea, unspecified: Secondary | ICD-10-CM | POA: Diagnosis not present

## 2017-06-25 DIAGNOSIS — Z7984 Long term (current) use of oral hypoglycemic drugs: Secondary | ICD-10-CM | POA: Insufficient documentation

## 2017-06-25 DIAGNOSIS — Z79899 Other long term (current) drug therapy: Secondary | ICD-10-CM | POA: Insufficient documentation

## 2017-06-25 DIAGNOSIS — E119 Type 2 diabetes mellitus without complications: Secondary | ICD-10-CM | POA: Diagnosis not present

## 2017-06-25 DIAGNOSIS — I10 Essential (primary) hypertension: Secondary | ICD-10-CM | POA: Insufficient documentation

## 2017-06-25 LAB — COMPREHENSIVE METABOLIC PANEL
ALBUMIN: 3.7 g/dL (ref 3.5–5.0)
ALK PHOS: 33 U/L — AB (ref 38–126)
ALT: 25 U/L (ref 14–54)
ANION GAP: 11 (ref 5–15)
AST: 21 U/L (ref 15–41)
BILIRUBIN TOTAL: 0.3 mg/dL (ref 0.3–1.2)
BUN: 14 mg/dL (ref 6–20)
CALCIUM: 9.1 mg/dL (ref 8.9–10.3)
CO2: 20 mmol/L — ABNORMAL LOW (ref 22–32)
Chloride: 106 mmol/L (ref 101–111)
Creatinine, Ser: 0.82 mg/dL (ref 0.44–1.00)
GFR calc Af Amer: >60 mL/min (ref >60)
GLUCOSE: 138 mg/dL — AB (ref 65–99)
POTASSIUM: 3.9 mmol/L (ref 3.5–5.1)
Sodium: 137 mmol/L (ref 135–145)
TOTAL PROTEIN: 7.5 g/dL (ref 6.5–8.1)

## 2017-06-25 LAB — URINALYSIS, COMPLETE (UACMP) WITH MICROSCOPIC
Bacteria, UA: NONE SEEN
Bilirubin Urine: NEGATIVE
Glucose, UA: >=500 mg/dL — AB
Ketones, ur: NEGATIVE mg/dL
Leukocytes, UA: NEGATIVE
Nitrite: NEGATIVE
PROTEIN: 30 mg/dL — AB
SPECIFIC GRAVITY, URINE: 1.036 — AB (ref 1.005–1.030)
pH: 5 (ref 5.0–8.0)

## 2017-06-25 LAB — CBC
HCT: 36.9 % (ref 35.0–47.0)
HEMOGLOBIN: 12.2 g/dL (ref 12.0–16.0)
MCH: 27.7 pg (ref 26.0–34.0)
MCHC: 33.1 g/dL (ref 32.0–36.0)
MCV: 83.7 fL (ref 80.0–100.0)
PLATELETS: 401 10*3/uL (ref 150–440)
RBC: 4.41 MIL/uL (ref 3.80–5.20)
RDW: 14.7 % — ABNORMAL HIGH (ref 11.5–14.5)
WBC: 7.5 10*3/uL (ref 3.6–11.0)

## 2017-06-25 LAB — LIPASE, BLOOD: Lipase: 34 U/L (ref 11–51)

## 2017-06-25 LAB — PREGNANCY, URINE: PREG TEST UR: NEGATIVE

## 2017-06-25 MED ORDER — KETOROLAC TROMETHAMINE 30 MG/ML IJ SOLN
INTRAMUSCULAR | Status: AC
  Filled 2017-06-25: qty 1

## 2017-06-25 MED ORDER — KETOROLAC TROMETHAMINE 30 MG/ML IJ SOLN
30.0000 mg | Freq: Once | INTRAMUSCULAR | Status: AC
Start: 2017-06-25 — End: 2017-06-25
  Administered 2017-06-25: 30 mg via INTRAMUSCULAR

## 2017-06-25 NOTE — ED Provider Notes (Signed)
[facility] Emergency Department Provider Note  ____________________________________________   I have reviewed the triage vital signs and the nursing notes. Where available I have reviewed prior notes and, if possible and indicated, outside hospital notes.    HISTORY  Chief Complaint Abdominal Pain    HPI Virginia Peterson is a 42 y.o. female a long history of painful discomfort with her menstrual periods presents today with painful discomfort with her menstrual period.  She has been having this pain for the last month or so.  Is worse when her period gets worse.  She states she has been bleeding all January.  Patient has a long history of similar problems and has been placed on birth control pills.  The birth control pills stopped her.  In December but now it is worse in January.  She does not feel lightheaded, she is having scant bleeding at this time, sometimes the flow gets worse sometimes it does not.  No other associated symptoms.  Crampy discomfort associated with exactly with multiple different menstrual periods.  Not sexually active.  Denies any vaginal discharge.  Is not tried anything at home.  Severity is mild to moderate most of the time.  Sometimes worse.  Patient wants "something for the pain".  Scheduled for an outpatient hysterectomy for this problem in about a few weeks. Called her OB and they told her if she is still having pain she should be evaluated she states.  Past Medical History:  Diagnosis Date  . Anxiety   . Asthma   . Depressed   . Diabetes mellitus without complication (HCC)   . Dysthymia 2016  . Hyperlipidemia   . Hypertension     Patient Active Problem List   Diagnosis Date Noted  . Morbid obesity with BMI of 40.0-44.9, adult (HCC) 10/03/2016  . Asthma without status asthmaticus 07/20/2016  . Diabetes mellitus type 2, uncomplicated (HCC) 07/20/2016  . Disc disease, degenerative, lumbar or lumbosacral 07/20/2016  . Generalized  anxiety disorder 07/20/2016  . Hyperlipidemia, unspecified 07/20/2016  . Hypertension 07/20/2016  . Pain syndrome, chronic 07/20/2016  . Depression 10/06/2015  . Adjustment disorder with mixed anxiety and depressed mood 10/06/2015    Past Surgical History:  Procedure Laterality Date  . benign neck tumor  2012  . CESAREAN SECTION    . ESOPHAGOGASTRODUODENOSCOPY (EGD) WITH PROPOFOL N/A 10/12/2016   Procedure: ESOPHAGOGASTRODUODENOSCOPY (EGD) WITH PROPOFOL;  Surgeon: Wyline Mood, MD;  Location: [facility] ENDOSCOPY;  Service: Endoscopy;  Laterality: N/A;  . NECK SURGERY      Prior to Admission medications   Medication Sig Start Date End Date Taking? Authorizing Provider  albuterol (PROVENTIL HFA;VENTOLIN HFA) 108 (90 BASE) MCG/ACT inhaler Inhale 2 puffs into the lungs every 6 (six) hours as needed for wheezing or shortness of breath. 04/25/15   Tommi Rumps, PA-C  amoxicillin (AMOXIL) 500 MG capsule Take 1 capsule (500 mg total) by mouth 3 (three) times daily. 05/16/17   Fisher, Roselyn Bering, PA-C  atorvastatin (LIPITOR) 20 MG tablet TK 1 T PO QD 08/23/16   [provider]  atorvastatin (LIPITOR) 20 MG tablet Take by mouth. 09/28/16 09/28/17  [provider]  escitalopram (LEXAPRO) 10 MG tablet Take by mouth.    [provider]  fluticasone (FLONASE) 50 MCG/ACT nasal spray SHAKE LQ AND U 2 SPRAYS IEN QD PRF RHINITIS OR ALLERGIES 08/14/16   [provider]  glipiZIDE (GLUCOTROL) 10 MG tablet Take 1 tablet (10 mg total) by mouth 2 (two) times daily.  06/13/16 07/13/16  Jene EveryKinner, Robert, MD  Levonorgestrel-Ethinyl Estradiol (AMETHIA,CAMRESE) 0.15-0.03 &0.01 MG tablet Take 1 tablet by mouth daily. 06/19/17   Nadara MustardHarris, Robert P, MD  lisinopril (PRINIVIL,ZESTRIL) 10 MG tablet Take 1 tablet (10 mg total) by mouth daily. 06/13/16 07/13/16  Jene EveryKinner, Robert, MD  lisinopril (PRINIVIL,ZESTRIL) 10 MG tablet TK 1 T PO  BID. 08/13/16   [provider]  loperamide (IMODIUM A-D) 2 MG  tablet Take 1 tablet (2 mg total) by mouth 4 (four) times daily as needed for diarrhea or loose stools. 02/22/17   Rockne MenghiniNorman, Anne-Caroline, MD  LORazepam (ATIVAN) 1 MG tablet Take 1 tablet (1 mg total) by mouth 2 (two) times daily as needed for anxiety. 07/28/16   Jennye MoccasinQuigley, Brian S, MD  meloxicam (MOBIC) 15 MG tablet Take 1 tablet (15 mg total) 2 (two) times daily as needed by mouth for pain. 04/08/17   Nadara MustardHarris, Robert P, MD  metFORMIN (GLUCOPHAGE) 1000 MG tablet TK 1 T PO  BID 08/13/16   [provider]  omeprazole (PRILOSEC) 40 MG capsule TAKE 1 CAPSULE BY MOUTH TWICE A DAY 05/07/17   Wyline MoodAnna, Kiran, MD  predniSONE (DELTASONE) 10 MG tablet Take 3 tablets (30 mg total) by mouth daily with breakfast. 05/16/17   Faythe GheeFisher, Susan W, PA-C  SYNJARDY XR 12.10-998 MG TB24  10/10/16   [provider]    Allergies Tramadol  Family History  Problem Relation Age of Onset  . Diabetes Mother   . Diabetes Father   . Diabetes Maternal Grandfather   . Diabetes Paternal Grandmother     Social History Social History   Tobacco Use  . Smoking status: Never Smoker  . Smokeless tobacco: Never Used  Substance Use Topics  . Alcohol use: No    Comment: occasional  . Drug use: No    Review of Systems Constitutional: No fever/chills Eyes: No visual changes. ENT: No sore throat. No stiff neck no neck pain Cardiovascular: Denies chest pain. Respiratory: Denies shortness of breath. Gastrointestinal:   no vomiting.  No diarrhea.  No constipation. Genitourinary: Negative for dysuria. Musculoskeletal: Negative lower extremity swelling Skin: Negative for rash. Neurological: Negative for severe headaches, focal weakness or numbness.   ____________________________________________   PHYSICAL EXAM:  VITAL SIGNS: ED Triage Vitals [06/25/17 1649]  Enc Vitals Group     BP (!) 124/59     Pulse Rate 76     Resp 18     Temp 98.2 F (36.8 C)     Temp Source Oral     SpO2 98 %     Weight      Height       Head Circumference      Peak Flow      Pain Score 10     Pain Loc      Pain Edu?      Excl. in GC?     Constitutional: Alert and oriented. Well appearing and in no acute distress. Eyes: Conjunctivae are normal Head: Atraumatic HEENT: No congestion/rhinnorhea. Mucous membranes are moist.  Oropharynx non-erythematous Neck:   Nontender with no meningismus, no masses, no stridor Cardiovascular: Normal rate, regular rhythm. Grossly normal heart sounds.  Good peripheral circulation. Respiratory: Normal respiratory effort.  No retractions. Lungs CTAB. Abdominal: Soft and nontender. No distention. No guarding no rebound Back:  There is no focal tenderness or step off.  there is no midline tenderness there are no lesions noted. there is no CVA tenderness Musculoskeletal: No lower extremity tenderness, no upper extremity  tenderness. No joint effusions, no DVT signs strong distal pulses no edema Neurologic:  Normal speech and language. No gross focal neurologic deficits are appreciated.  Skin:  Skin is warm, dry and intact. No rash noted. Psychiatric: Mood and affect are normal. Speech and behavior are normal.  ____________________________________________   LABS (all labs ordered are listed, but only abnormal results are displayed)  Labs Reviewed  COMPREHENSIVE METABOLIC PANEL - Abnormal; Notable for the following components:      Result Value   CO2 20 (*)    Glucose, Bld 138 (*)    Alkaline Phosphatase 33 (*)    All other components within normal limits  CBC - Abnormal; Notable for the following components:   RDW 14.7 (*)    All other components within normal limits  URINALYSIS, COMPLETE (UACMP) WITH MICROSCOPIC - Abnormal; Notable for the following components:   Color, Urine YELLOW (*)    APPearance CLOUDY (*)    Specific Gravity, Urine 1.036 (*)    Glucose, UA >=500 (*)    Hgb urine dipstick LARGE (*)    Protein, ur 30 (*)    Squamous Epithelial / LPF 0-5 (*)    All other  components within normal limits  LIPASE, BLOOD  PREGNANCY, URINE    Pertinent labs  results that were available during my care of the patient were reviewed by me and considered in my medical decision making (see chart for details). ____________________________________________  EKG  I personally interpreted any EKGs ordered by me or triage ____________________________________________  RADIOLOGY  Pertinent labs & imaging results that were available during my care of the patient were reviewed by me and considered in my medical decision making (see chart for details). If possible, patient and/or family made aware of any abnormal findings.  No results found. ____________________________________________    PROCEDURES  Procedure(s) performed: None  Procedures  Critical Care performed: None  ____________________________________________   INITIAL IMPRESSION / ASSESSMENT AND PLAN / ED COURSE  Pertinent labs & imaging results that were available during my care of the patient were reviewed by me and considered in my medical decision making (see chart for details).  Patient with 12 visits to the emergency room for pain and/or swelling related complaints in the last year presents today with abdominal pain.  She has abdominal suprapubic cramping in the context of her menstrual period.  She would prefer not to have a pelvic exam at this time as she does not think it would show anything.  She understands I cannot rule out PID or other associated illness.  Certainly nothing to suggest appendicitis, TOA or anything else of that variety significantly intra-abdominally at this time.  Considering the patient's symptoms, medical history, and physical examination today, I have low suspicion for cholecystitis or biliary pathology, pancreatitis, perforation or bowel obstruction, hernia, intra-abdominal abscess, AAA or dissection, volvulus or intussusception, mesenteric ischemia, ischemic gut,  pyelonephritis or appendicitis.  We will treat her pain which is why she is here, patient declines cath urine, no evidence of urinary tract infection.  Hemoglobin is stable vital signs are stable very frequent visitor to the emergency department for abdominal issues with no evidence of acute pathology today extensive return precautions and follow-up given    ____________________________________________   FINAL CLINICAL IMPRESSION(S) / ED DIAGNOSES  Final diagnoses:  None      This chart was dictated using voice recognition software.  Despite best efforts to proofread,  errors can occur which can change meaning.  Jeanmarie Plant, MD 06/25/17 [phone]

## 2017-06-25 NOTE — ED Notes (Signed)
FN: pt to ed via acems with reports of lower abd pain since Friday, pt sch to have a hysterectomy in Feb, called her Dr and they told her to come to ER. Ems reports all VSS. CBG [phone].

## 2017-06-25 NOTE — Telephone Encounter (Signed)
Pt states bleeding and pain all weekend, I told her [facility] if [facility] wanted, pt states she will go to the ER this afternoon , pt aware I will let [facility] know.

## 2017-06-25 NOTE — ED Triage Notes (Signed)
Pt to ed with c/o abd pain since Friday.  Pt states scheduled for hysterectomy on Feb 19th.  Pt states she was put on birth control pills to stop period, however, she states no period in Dec, heavy bleeding all of Jan.  Pt also reports shooting abd pain, intermittently.

## 2017-06-25 NOTE — Telephone Encounter (Signed)
Pt states on triage line she is having non-stop pain. She wants [facility] or his nurse to call her back.  Pt has an H&P for surgery scheduled for February. Does she need to be seen before that appointment? CB# [phone]

## 2017-06-25 NOTE — ED Notes (Signed)
Pt to ER c/o lower abdominal pain worsening over night. Pt reports scheduled hysterectomy in February. Vaginal bleeding X 1 month.

## 2017-06-26 NOTE — Telephone Encounter (Signed)
Patient is [facility] on 06/28/17 @ 10:50am, Pre-admit Testing phone interview to be rescheduled, and OR on 07/04/17.

## 2017-06-26 NOTE — Telephone Encounter (Signed)
If available can move surgery.  I reviewed ER notes and am reassured that nothing else more serious is going on.

## 2017-06-26 NOTE — Telephone Encounter (Signed)
Pt is calling about following up on her ER follow up. Pt is wanting to move her surgery to a closer date due to severe pain. Please advise

## 2017-06-28 ENCOUNTER — Encounter
Admission: RE | Admit: 2017-06-28 | Discharge: 2017-06-28 | Disposition: A | Discharge status: Home / Self Care | Payer: BLUE CROSS/BLUE SHIELD | Source: Ambulatory Visit | Attending: Obstetrics & Gynecology | Admitting: Obstetrics & Gynecology

## 2017-06-28 ENCOUNTER — Ambulatory Visit (INDEPENDENT_AMBULATORY_CARE_PROVIDER_SITE_OTHER): Payer: BLUE CROSS/BLUE SHIELD | Admitting: Obstetrics & Gynecology

## 2017-06-28 ENCOUNTER — Encounter: Payer: Self-pay | Admitting: Obstetrics & Gynecology

## 2017-06-28 ENCOUNTER — Other Ambulatory Visit: Payer: Self-pay

## 2017-06-28 VITALS — BP 140/80 | HR 79 | Ht 64.0 in | Wt 220.0 lb

## 2017-06-28 DIAGNOSIS — R102 Pelvic and perineal pain: Secondary | ICD-10-CM | POA: Diagnosis not present

## 2017-06-28 DIAGNOSIS — N938 Other specified abnormal uterine and vaginal bleeding: Secondary | ICD-10-CM | POA: Diagnosis not present

## 2017-06-28 HISTORY — DX: Gastro-esophageal reflux disease without esophagitis: K21.9

## 2017-06-28 NOTE — Patient Instructions (Signed)
  Your procedure is scheduled on: 07-04-17 THURSDAY Report to Same Day Surgery 2nd floor medical mall [facility](Medical Mall Entrance-take elevator on left to 2nd floor.  Check in with surgery information desk.) To find out your arrival time please call [phone] between 1PM - 3PM [facility]  Remember: Instructions that are not followed completely may result in serious medical risk, up to and including death, or upon the discretion of your surgeon and anesthesiologist your surgery may need to be rescheduled.    _x___ 1. Do not eat food after midnight the night before your procedure. NO GUM OR CANDY AFTER MIDNIGHT.  You may drink WATER up to 2 hours before you are scheduled to arrive at the hospital for your procedure.  Do not drink WATER within 2 hours of your scheduled arrival to the hospital.  Type 1 and type 2 diabetics should only drink water.     __x__ 2. No Alcohol for 24 hours before or after surgery.   __x__3. No Smoking for 24 prior to surgery.   ____  4. Bring all medications with you on the day of surgery if instructed.    __x__ 5. Notify your doctor if there is any change in your medical condition     (cold, fever, infections).     Do not wear jewelry, make-up, hairpins, clips or nail polish.  Do not wear lotions, powders, or perfumes. You may wear deodorant.  Do not shave 48 hours prior to surgery. Men may shave face and neck.  Do not bring valuables to the hospital.    [facility] any belongings or valuables.               Contacts, dentures or bridgework may not be worn into surgery.  Leave your suitcase in the car. After surgery it may be brought to your room.  For patients admitted to the hospital, discharge time is determined by your treatment team.   Patients discharged the day of surgery will not be allowed to drive home.  You will need someone to drive you home and stay with you the night of your procedure.    Please read over the  following fact sheets that you were given:   [facility] Preparing [facility] and or MRSA Information   _x___ TAKE THE FOLLOWING MEDICATION THE MORNING OF SURGERY WITH A SMALL SIP OF WATER. These include:  1. PRILOSEC  2. YOU MAY TAKE A XANAX AM OF SURGERY IF NEEDED  3.  4.  5.  6.  ____Fleets enema or Magnesium Citrate as directed.   _x___ Use CHG Soap or sage wipes as directed on instruction sheet   _X___ Use inhalers on the day of surgery and bring to hospital day of surgery-USE ALBUTEROL NEBULIZER AM OF SURGERY AND BRING ALBUTEROL INHALER TO HOSPITAL  _X___ Stop Metformin 2 days prior to surgery-[facility]  ____ Take 1/2 of usual insulin dose the night before surgery and none on the morning     surgery.   ____ Follow recommendations from Cardiologist, Pulmonologist or PCP regarding stopping Aspirin, Coumadin, Plavix ,Eliquis, Effient, or Pradaxa, and Pletal.  X____Stop Anti-inflammatories such as Advil, Aleve, Ibuprofen, Motrin, Naproxen, MELOXICAM, Naprosyn, Goodies powders, EXCEDRIN MIGRAINE or aspirin products NOW-OK to take Tylenol    ____ Stop supplements until after surgery.     ____ Bring C-Pap to the hospital.

## 2017-06-28 NOTE — Patient Instructions (Signed)

## 2017-06-28 NOTE — Progress Notes (Signed)
PRE-OPERATIVE HISTORY AND PHYSICAL EXAM  HPI:  Virginia Peterson is a 42 y.o. G1P1001 Patient's last menstrual period was 06/25/2017.; she is being admitted for surgery related to abnormal uterine bleeding and pelvic pain.  Dennie Bible has had chronic pelvic pain of many months, severe at times, radiating to back, associated with bleeding or blackish vag discharge; unresponsive ot NDAIs inclusing Meloxicam and OCPs.    PMHx: Past Medical History:  Diagnosis Date  . Anxiety   . Asthma   . Depressed   . Diabetes mellitus without complication (HCC)   . Dysthymia 2016  . Hyperlipidemia   . Hypertension    Past Surgical History:  Procedure Laterality Date  . benign neck tumor  2012  . CESAREAN SECTION    . ESOPHAGOGASTRODUODENOSCOPY (EGD) WITH PROPOFOL N/A 10/12/2016   Procedure: ESOPHAGOGASTRODUODENOSCOPY (EGD) WITH PROPOFOL;  Surgeon: Wyline Mood, MD;  Location: [facility] ENDOSCOPY;  Service: Endoscopy;  Laterality: N/A;  . NECK SURGERY     Family History  Problem Relation Age of Onset  . Diabetes Mother   . Diabetes Father   . Diabetes Maternal Grandfather   . Diabetes Paternal Grandmother    Social History   Tobacco Use  . Smoking status: Never Smoker  . Smokeless tobacco: Never Used  Substance Use Topics  . Alcohol use: No    Comment: occasional  . Drug use: No    Current Outpatient Medications:  .  albuterol (ACCUNEB) 1.25 MG/3ML nebulizer solution, Inhale 1 ampule into the lungs every 6 (six) hours as needed for wheezing or shortness of breath. , Disp: , Rfl:  .  albuterol (PROVENTIL HFA;VENTOLIN HFA) 108 (90 BASE) MCG/ACT inhaler, Inhale 2 puffs into the lungs every 6 (six) hours as needed for wheezing or shortness of breath., Disp: 1 Inhaler, Rfl: 2 .  ALPRAZolam (XANAX) 0.25 MG tablet, TAKE 1 TABLET (0.25 MG TOTAL) BY MOUTH DAILY AS NEEDED FOR ANXIETY, Disp: , Rfl: 1 .  amoxicillin (AMOXIL) 500 MG capsule, Take 1 capsule (500 mg total) by mouth 3 (three) times daily., Disp: 30  capsule, Rfl: 0 .  aspirin-acetaminophen-caffeine (EXCEDRIN MIGRAINE) 250-250-65 MG tablet, Take 2 tablets by mouth every 8 (eight) hours as needed for headache., Disp: , Rfl:  .  atorvastatin (LIPITOR) 20 MG tablet, Take 20 mg by mouth at bedtime. , Disp: , Rfl:  .  escitalopram (LEXAPRO) 20 MG tablet, Take 30 mg by mouth at bedtime. Takes 1.5 tablets, Disp: , Rfl:  .  fluticasone (FLONASE) 50 MCG/ACT nasal spray, SHAKE LQ AND U 2 SPRAYS IEN QD PRF RHINITIS OR ALLERGIES, Disp: , Rfl: 3 .  glipiZIDE (GLUCOTROL) 10 MG tablet, Take 1 tablet (10 mg total) by mouth 2 (two) times daily., Disp: 60 tablet, Rfl: 0 .  glipiZIDE (GLUCOTROL) 10 MG tablet, Take 10 mg by mouth 2 (two) times daily before a meal., Disp: , Rfl:  .  Levonorgestrel-Ethinyl Estradiol (AMETHIA,CAMRESE) 0.15-0.03 &0.01 MG tablet, Take 1 tablet by mouth daily., Disp: 1 Package, Rfl: 4 .  lisinopril (PRINIVIL,ZESTRIL) 10 MG tablet, Take 1 tablet (10 mg total) by mouth daily. (Patient taking differently: Take 10 mg by mouth 2 (two) times daily. ), Disp: 30 tablet, Rfl: 0 .  loperamide (IMODIUM A-D) 2 MG tablet, Take 1 tablet (2 mg total) by mouth 4 (four) times daily as needed for diarrhea or loose stools., Disp: 12 tablet, Rfl: 0 .  LORazepam (ATIVAN) 1 MG tablet, Take 1 tablet (1 mg total) by mouth 2 (two) times daily  as needed for anxiety. (Patient not taking: Reported on 06/26/2017), Disp: 10 tablet, Rfl: 0 .  omeprazole (PRILOSEC) 40 MG capsule, TAKE 1 CAPSULE BY MOUTH TWICE A DAY, Disp: 30 capsule, Rfl: 0 .  predniSONE (DELTASONE) 10 MG tablet, Take 3 tablets (30 mg total) by mouth daily with breakfast. (Patient not taking: Reported on 06/26/2017), Disp: 9 tablet, Rfl: 0 .  SYNJARDY XR 12.10-998 MG TB24, Take 2 tablets by mouth daily. , Disp: , Rfl: 4 Allergies: Tramadol  Review of Systems  Constitutional: Negative for chills, fever and malaise/fatigue.  HENT: Negative for congestion, sinus pain and sore throat.   Eyes: Negative for  blurred vision and pain.  Respiratory: Negative for cough and wheezing.   Cardiovascular: Negative for chest pain and leg swelling.  Gastrointestinal: Negative for abdominal pain, constipation, diarrhea, heartburn, nausea and vomiting.  Genitourinary: Negative for dysuria, frequency, hematuria and urgency.  Musculoskeletal: Negative for back pain, joint pain, myalgias and neck pain.  Skin: Negative for itching and rash.  Neurological: Negative for dizziness, tremors and weakness.  Endo/Heme/Allergies: Does not bruise/bleed easily.  Psychiatric/Behavioral: Negative for depression. The patient is not nervous/anxious and does not have insomnia.     Objective: LMP 06/25/2017  There were no vitals filed for this visit. Physical Exam  Constitutional: She is oriented to person, place, and time. She appears well-developed and well-nourished. No distress.  Genitourinary: Rectum normal, vagina normal and uterus normal. Pelvic exam was performed with patient supine. There is no rash or lesion on the right labia. There is no rash or lesion on the left labia. Vagina exhibits no lesion. No bleeding in the vagina. Right adnexum does not display mass and does not display tenderness. Left adnexum does not display mass and does not display tenderness. Cervix does not exhibit motion tenderness, lesion, friability or polyp.   Uterus is mobile and midaxial. Uterus is not enlarged or exhibiting a mass.  HENT:  Head: Normocephalic and atraumatic. Head is without laceration.  Right Ear: Hearing normal.  Left Ear: Hearing normal.  Nose: No epistaxis.  No foreign bodies.  Mouth/Throat: Uvula is midline, oropharynx is clear and moist and mucous membranes are normal.  Eyes: Pupils are equal, round, and reactive to light.  Neck: Normal range of motion. Neck supple. No thyromegaly present.  Cardiovascular: Normal rate and regular rhythm. Exam reveals no gallop and no friction rub.  No murmur heard. Pulmonary/Chest:  Effort normal and breath sounds normal. No respiratory distress. She has no wheezes. Right breast exhibits no mass, no skin change and no tenderness. Left breast exhibits no mass, no skin change and no tenderness.  Abdominal: Soft. Bowel sounds are normal. She exhibits no distension. There is no tenderness. There is no rebound.  Musculoskeletal: Normal range of motion.  Neurological: She is alert and oriented to person, place, and time. No cranial nerve deficit.  Skin: Skin is warm and dry.  Psychiatric: She has a normal mood and affect. Judgment normal.  Vitals reviewed.  Assessment: 1. Pelvic pain in female   2. Dysfunctional uterine bleeding   All options discussed, desires hysterectomy.  I have had a careful discussion with this patient about all the options available and the risk/benefits of each. I have fully informed this patient that surgery may subject her to a variety of discomforts and risks: She understands that most patients have surgery with little difficulty, but problems can happen ranging from minor to fatal. These include nausea, vomiting, pain, bleeding, infection, poor healing, hernia, or  formation of adhesions. Unexpected reactions may occur from any drug or anesthetic given. Unintended injury may occur to other pelvic or abdominal structures such as Fallopian tubes, ovaries, bladder, ureter (tube from kidney to bladder), or bowel. Nerves going from the pelvis to the legs may be injured. Any such injury may require immediate or later additional surgery to correct the problem. Excessive blood loss requiring transfusion is very unlikely but possible. Dangerous blood clots may form in the legs or lungs. Physical and sexual activity will be restricted in varying degrees for an indeterminate period of time but most often 2-6 weeks.  Finally, she understands that it is impossible to list every possible undesirable effect and that the condition for which surgery is done is not always cured  or significantly improved, and in rare cases may be even worse.Ample time was given to answer all questions.  [name], MD, [facility], [facility] 06/28/2017  10:55 AM

## 2017-07-01 ENCOUNTER — Encounter
Admission: RE | Admit: 2017-07-01 | Discharge: 2017-07-01 | Disposition: A | Discharge status: Home / Self Care | Payer: BLUE CROSS/BLUE SHIELD | Source: Ambulatory Visit | Attending: Obstetrics & Gynecology | Admitting: Obstetrics & Gynecology

## 2017-07-01 DIAGNOSIS — K219 Gastro-esophageal reflux disease without esophagitis: Secondary | ICD-10-CM | POA: Diagnosis not present

## 2017-07-01 DIAGNOSIS — I10 Essential (primary) hypertension: Secondary | ICD-10-CM | POA: Diagnosis not present

## 2017-07-01 DIAGNOSIS — N921 Excessive and frequent menstruation with irregular cycle: Secondary | ICD-10-CM | POA: Diagnosis present

## 2017-07-01 DIAGNOSIS — Z6837 Body mass index (BMI) 37.0-37.9, adult: Secondary | ICD-10-CM | POA: Diagnosis not present

## 2017-07-01 DIAGNOSIS — E119 Type 2 diabetes mellitus without complications: Secondary | ICD-10-CM | POA: Diagnosis not present

## 2017-07-01 DIAGNOSIS — Z79899 Other long term (current) drug therapy: Secondary | ICD-10-CM | POA: Diagnosis not present

## 2017-07-01 DIAGNOSIS — N888 Other specified noninflammatory disorders of cervix uteri: Secondary | ICD-10-CM | POA: Diagnosis not present

## 2017-07-01 DIAGNOSIS — N72 Inflammatory disease of cervix uteri: Secondary | ICD-10-CM | POA: Diagnosis not present

## 2017-07-01 DIAGNOSIS — Z87891 Personal history of nicotine dependence: Secondary | ICD-10-CM | POA: Diagnosis not present

## 2017-07-01 DIAGNOSIS — N838 Other noninflammatory disorders of ovary, fallopian tube and broad ligament: Secondary | ICD-10-CM | POA: Diagnosis not present

## 2017-07-01 LAB — CBC
HCT: 36.7 % (ref 35.0–47.0)
Hemoglobin: 12.2 g/dL (ref 12.0–16.0)
MCH: 27.7 pg (ref 26.0–34.0)
MCHC: 33.3 g/dL (ref 32.0–36.0)
MCV: 83.3 fL (ref 80.0–100.0)
Platelets: 399 10*3/uL (ref 150–440)
RBC: 4.41 MIL/uL (ref 3.80–5.20)
RDW: 14.7 % — AB (ref 11.5–14.5)
WBC: 8.9 10*3/uL (ref 3.6–11.0)

## 2017-07-01 LAB — TYPE AND SCREEN
ABO/RH(D): A NEG
Antibody Screen: NEGATIVE

## 2017-07-03 MED ORDER — CEFOXITIN SODIUM-DEXTROSE 2-2.2 GM-%(50ML) IV SOLR
2.0000 g | INTRAVENOUS | Status: AC
  Administered 2017-07-04: 2 g via INTRAVENOUS

## 2017-07-04 ENCOUNTER — Ambulatory Visit: Payer: BLUE CROSS/BLUE SHIELD | Admitting: Anesthesiology

## 2017-07-04 ENCOUNTER — Other Ambulatory Visit: Payer: Self-pay

## 2017-07-04 ENCOUNTER — Ambulatory Visit
Admission: RE | Admit: 2017-07-04 | Discharge: 2017-07-04 | Disposition: A | Discharge status: Home / Self Care | Payer: BLUE CROSS/BLUE SHIELD | Source: Ambulatory Visit | Attending: Obstetrics & Gynecology | Admitting: Obstetrics & Gynecology

## 2017-07-04 ENCOUNTER — Encounter
Admission: RE | Disposition: A | Discharge status: Home / Self Care | Payer: Self-pay | Source: Ambulatory Visit | Attending: Obstetrics & Gynecology

## 2017-07-04 ENCOUNTER — Encounter: Payer: Self-pay | Admitting: *Deleted

## 2017-07-04 DIAGNOSIS — N838 Other noninflammatory disorders of ovary, fallopian tube and broad ligament: Secondary | ICD-10-CM | POA: Insufficient documentation

## 2017-07-04 DIAGNOSIS — N72 Inflammatory disease of cervix uteri: Secondary | ICD-10-CM | POA: Diagnosis not present

## 2017-07-04 DIAGNOSIS — K219 Gastro-esophageal reflux disease without esophagitis: Secondary | ICD-10-CM | POA: Insufficient documentation

## 2017-07-04 DIAGNOSIS — Z6837 Body mass index (BMI) 37.0-37.9, adult: Secondary | ICD-10-CM | POA: Insufficient documentation

## 2017-07-04 DIAGNOSIS — E119 Type 2 diabetes mellitus without complications: Secondary | ICD-10-CM | POA: Insufficient documentation

## 2017-07-04 DIAGNOSIS — Z79899 Other long term (current) drug therapy: Secondary | ICD-10-CM | POA: Insufficient documentation

## 2017-07-04 DIAGNOSIS — I10 Essential (primary) hypertension: Secondary | ICD-10-CM | POA: Insufficient documentation

## 2017-07-04 DIAGNOSIS — R102 Pelvic and perineal pain: Secondary | ICD-10-CM | POA: Diagnosis present

## 2017-07-04 DIAGNOSIS — N888 Other specified noninflammatory disorders of cervix uteri: Secondary | ICD-10-CM | POA: Insufficient documentation

## 2017-07-04 DIAGNOSIS — Z87891 Personal history of nicotine dependence: Secondary | ICD-10-CM | POA: Insufficient documentation

## 2017-07-04 DIAGNOSIS — N921 Excessive and frequent menstruation with irregular cycle: Secondary | ICD-10-CM | POA: Insufficient documentation

## 2017-07-04 DIAGNOSIS — N938 Other specified abnormal uterine and vaginal bleeding: Secondary | ICD-10-CM | POA: Diagnosis present

## 2017-07-04 HISTORY — PX: LAPAROSCOPIC HYSTERECTOMY: SHX1926

## 2017-07-04 HISTORY — PX: CYSTOSCOPY: SHX5120

## 2017-07-04 LAB — ABO/RH: ABO/RH(D): A NEG

## 2017-07-04 LAB — GLUCOSE, CAPILLARY
GLUCOSE-CAPILLARY: 153 mg/dL — AB (ref 65–99)
Glucose-Capillary: 147 mg/dL — ABNORMAL HIGH (ref 65–99)

## 2017-07-04 LAB — POCT PREGNANCY, URINE: Preg Test, Ur: NEGATIVE

## 2017-07-04 SURGERY — HYSTERECTOMY, TOTAL, LAPAROSCOPIC
Anesthesia: General

## 2017-07-04 MED ORDER — FENTANYL CITRATE (PF) 100 MCG/2ML IJ SOLN
INTRAMUSCULAR | Status: DC | PRN
  Administered 2017-07-04: 25 ug via INTRAVENOUS
  Administered 2017-07-04: 50 ug via INTRAVENOUS
  Administered 2017-07-04 (×2): 25 ug via INTRAVENOUS
  Administered 2017-07-04: 100 ug via INTRAVENOUS
  Administered 2017-07-04: 25 ug via INTRAVENOUS

## 2017-07-04 MED ORDER — HYDROMORPHONE HCL 1 MG/ML IJ SOLN
0.5000 mg | INTRAMUSCULAR | Status: DC | PRN
  Administered 2017-07-04 (×2): 0.5 mg via INTRAVENOUS

## 2017-07-04 MED ORDER — FENTANYL CITRATE (PF) 250 MCG/5ML IJ SOLN
INTRAMUSCULAR | Status: AC
  Filled 2017-07-04: qty 5

## 2017-07-04 MED ORDER — MORPHINE SULFATE (PF) 4 MG/ML IV SOLN: 1.0000 mg | INTRAVENOUS | Status: DC | PRN

## 2017-07-04 MED ORDER — ROCURONIUM BROMIDE 50 MG/5ML IV SOLN
INTRAVENOUS | Status: AC
  Filled 2017-07-04: qty 1

## 2017-07-04 MED ORDER — SUGAMMADEX SODIUM 500 MG/5ML IV SOLN
INTRAVENOUS | Status: AC
  Filled 2017-07-04: qty 5

## 2017-07-04 MED ORDER — SUCCINYLCHOLINE CHLORIDE 20 MG/ML IJ SOLN
INTRAMUSCULAR | Status: DC | PRN
  Administered 2017-07-04: 100 mg via INTRAVENOUS
  Administered 2017-07-04: 40 mg via INTRAVENOUS

## 2017-07-04 MED ORDER — ACETAMINOPHEN NICU IV SYRINGE 10 MG/ML
INTRAVENOUS | Status: AC
  Filled 2017-07-04: qty 1

## 2017-07-04 MED ORDER — FENTANYL CITRATE (PF) 100 MCG/2ML IJ SOLN
INTRAMUSCULAR | Status: AC
  Administered 2017-07-04: 25 ug via INTRAVENOUS
  Filled 2017-07-04: qty 2

## 2017-07-04 MED ORDER — LIDOCAINE HCL (CARDIAC) 20 MG/ML IV SOLN: INTRAVENOUS | Status: DC | PRN

## 2017-07-04 MED ORDER — KETOROLAC TROMETHAMINE 30 MG/ML IJ SOLN
INTRAMUSCULAR | Status: AC
  Administered 2017-07-04: 30 mg via INTRAVENOUS
  Filled 2017-07-04: qty 1

## 2017-07-04 MED ORDER — SUGAMMADEX SODIUM 500 MG/5ML IV SOLN
INTRAVENOUS | Status: DC | PRN
  Administered 2017-07-04: 200 mg via INTRAVENOUS

## 2017-07-04 MED ORDER — ROCURONIUM BROMIDE 100 MG/10ML IV SOLN: INTRAVENOUS | Status: DC | PRN

## 2017-07-04 MED ORDER — ACETAMINOPHEN 10 MG/ML IV SOLN
INTRAVENOUS | Status: DC | PRN
  Administered 2017-07-04: 1000 mg via INTRAVENOUS

## 2017-07-04 MED ORDER — PROPOFOL 10 MG/ML IV BOLUS
INTRAVENOUS | Status: AC
  Filled 2017-07-04: qty 20

## 2017-07-04 MED ORDER — BUPIVACAINE HCL (PF) 0.5 % IJ SOLN
INTRAMUSCULAR | Status: DC | PRN
  Administered 2017-07-04: 15 mL

## 2017-07-04 MED ORDER — CEFAZOLIN SODIUM-DEXTROSE 2-3 GM-%(50ML) IV SOLR: INTRAVENOUS | Status: DC | PRN

## 2017-07-04 MED ORDER — PROPOFOL 10 MG/ML IV BOLUS
INTRAVENOUS | Status: DC | PRN
  Administered 2017-07-04: 100 mg via INTRAVENOUS

## 2017-07-04 MED ORDER — MIDAZOLAM HCL 2 MG/2ML IJ SOLN
INTRAMUSCULAR | Status: AC
  Filled 2017-07-04: qty 2

## 2017-07-04 MED ORDER — CEFOXITIN SODIUM-DEXTROSE 2-2.2 GM-%(50ML) IV SOLR
INTRAVENOUS | Status: AC
  Filled 2017-07-04: qty 50

## 2017-07-04 MED ORDER — MIDAZOLAM HCL 2 MG/2ML IJ SOLN
INTRAMUSCULAR | Status: DC | PRN
  Administered 2017-07-04: 2 mg via INTRAVENOUS

## 2017-07-04 MED ORDER — ONDANSETRON HCL 4 MG/2ML IJ SOLN
INTRAMUSCULAR | Status: AC
  Filled 2017-07-04: qty 2

## 2017-07-04 MED ORDER — ROCURONIUM BROMIDE 100 MG/10ML IV SOLN
INTRAVENOUS | Status: DC | PRN
  Administered 2017-07-04: 20 mg via INTRAVENOUS
  Administered 2017-07-04: 5 mg via INTRAVENOUS
  Administered 2017-07-04: 45 mg via INTRAVENOUS

## 2017-07-04 MED ORDER — GLYCOPYRROLATE 0.2 MG/ML IJ SOLN: INTRAMUSCULAR | Status: DC | PRN

## 2017-07-04 MED ORDER — SUCCINYLCHOLINE CHLORIDE 20 MG/ML IJ SOLN
INTRAMUSCULAR | Status: AC
  Filled 2017-07-04: qty 1

## 2017-07-04 MED ORDER — OXYCODONE-ACETAMINOPHEN 5-325 MG PO TABS
ORAL_TABLET | ORAL | Status: AC
  Administered 2017-07-04: 1 via ORAL
  Filled 2017-07-04: qty 1

## 2017-07-04 MED ORDER — EPHEDRINE SULFATE 50 MG/ML IJ SOLN
INTRAMUSCULAR | Status: AC
  Filled 2017-07-04: qty 1

## 2017-07-04 MED ORDER — ONDANSETRON HCL 4 MG/2ML IJ SOLN
INTRAMUSCULAR | Status: DC | PRN
  Administered 2017-07-04: 4 mg via INTRAVENOUS

## 2017-07-04 MED ORDER — FENTANYL CITRATE (PF) 100 MCG/2ML IJ SOLN
25.0000 ug | INTRAMUSCULAR | Status: AC | PRN
  Administered 2017-07-04 (×6): 25 ug via INTRAVENOUS

## 2017-07-04 MED ORDER — ONDANSETRON HCL 4 MG/2ML IJ SOLN: 4.0000 mg | Freq: Once | INTRAMUSCULAR | Status: DC | PRN

## 2017-07-04 MED ORDER — KETOROLAC TROMETHAMINE 30 MG/ML IJ SOLN
30.0000 mg | Freq: Four times a day (QID) | INTRAMUSCULAR | Status: DC
  Administered 2017-07-04: 30 mg via INTRAVENOUS
  Filled 2017-07-04: qty 1

## 2017-07-04 MED ORDER — HYDROMORPHONE HCL 1 MG/ML IJ SOLN
INTRAMUSCULAR | Status: AC
  Administered 2017-07-04: 0.5 mg via INTRAVENOUS
  Filled 2017-07-04: qty 1

## 2017-07-04 MED ORDER — OXYCODONE-ACETAMINOPHEN 5-325 MG PO TABS: 1.0000 | ORAL_TABLET | ORAL | 0 refills | Status: DC | PRN

## 2017-07-04 MED ORDER — FENTANYL CITRATE (PF) 100 MCG/2ML IJ SOLN: INTRAMUSCULAR | Status: DC | PRN

## 2017-07-04 MED ORDER — ACETAMINOPHEN 325 MG PO TABS: 650.0000 mg | ORAL_TABLET | ORAL | Status: DC | PRN

## 2017-07-04 MED ORDER — DEXAMETHASONE SODIUM PHOSPHATE 10 MG/ML IJ SOLN
INTRAMUSCULAR | Status: AC
  Filled 2017-07-04: qty 1

## 2017-07-04 MED ORDER — LIDOCAINE HCL (PF) 2 % IJ SOLN
INTRAMUSCULAR | Status: AC
  Filled 2017-07-04: qty 20

## 2017-07-04 MED ORDER — EPHEDRINE SULFATE 50 MG/ML IJ SOLN
INTRAMUSCULAR | Status: DC | PRN
  Administered 2017-07-04 (×2): 5 mg via INTRAVENOUS

## 2017-07-04 MED ORDER — LACTATED RINGERS IV SOLN
Freq: Once | INTRAVENOUS | Status: AC
  Administered 2017-07-04: 07:00:00 via INTRAVENOUS

## 2017-07-04 MED ORDER — LIDOCAINE HCL (PF) 2 % IJ SOLN
INTRAMUSCULAR | Status: AC
  Filled 2017-07-04: qty 10

## 2017-07-04 MED ORDER — MIDAZOLAM HCL 2 MG/2ML IJ SOLN: INTRAMUSCULAR | Status: DC | PRN

## 2017-07-04 MED ORDER — SUCCINYLCHOLINE CHLORIDE 20 MG/ML IJ SOLN
INTRAMUSCULAR | Status: DC | PRN
Start: 2017-07-04 — End: 2017-07-04

## 2017-07-04 MED ORDER — DEXAMETHASONE SODIUM PHOSPHATE 10 MG/ML IJ SOLN
INTRAMUSCULAR | Status: DC | PRN
  Administered 2017-07-04: 5 mg via INTRAVENOUS

## 2017-07-04 MED ORDER — DEXAMETHASONE SODIUM PHOSPHATE 10 MG/ML IJ SOLN: INTRAMUSCULAR | Status: DC | PRN

## 2017-07-04 MED ORDER — LACTATED RINGERS IV SOLN: INTRAVENOUS | Status: DC

## 2017-07-04 MED ORDER — OXYCODONE-ACETAMINOPHEN 5-325 MG PO TABS
1.0000 | ORAL_TABLET | ORAL | Status: DC | PRN
  Administered 2017-07-04: 1 via ORAL

## 2017-07-04 MED ORDER — PHENYLEPHRINE HCL 10 MG/ML IJ SOLN
INTRAMUSCULAR | Status: AC
  Filled 2017-07-04: qty 1

## 2017-07-04 MED ORDER — PROPOFOL 10 MG/ML IV BOLUS: INTRAVENOUS | Status: DC | PRN

## 2017-07-04 MED ORDER — ACETAMINOPHEN 650 MG RE SUPP
650.0000 mg | RECTAL | Status: DC | PRN
  Filled 2017-07-04: qty 1

## 2017-07-04 MED ORDER — BUPIVACAINE HCL (PF) 0.5 % IJ SOLN
INTRAMUSCULAR | Status: AC
  Filled 2017-07-04: qty 30

## 2017-07-04 MED ORDER — LIDOCAINE HCL (CARDIAC) 20 MG/ML IV SOLN
INTRAVENOUS | Status: DC | PRN
  Administered 2017-07-04: 80 mg via INTRAVENOUS

## 2017-07-04 MED ORDER — SODIUM CHLORIDE 0.9 % IV SOLN
INTRAVENOUS | Status: DC
  Administered 2017-07-04: 07:00:00 via INTRAVENOUS

## 2017-07-04 SURGICAL SUPPLY — 54 items
BAG URINE DRAINAGE (UROLOGICAL SUPPLIES) ×4 IMPLANT
BLADE SURG SZ11 CARB STEEL (BLADE) ×4 IMPLANT
CANISTER SUCT 1200ML W/VALVE (MISCELLANEOUS) ×4 IMPLANT
CATH FOLEY 2WAY  5CC 16FR (CATHETERS) ×2
CATH URTH 16FR FL 2W BLN LF (CATHETERS) ×2 IMPLANT
CHLORAPREP W/TINT 26ML (MISCELLANEOUS) ×4 IMPLANT
DEFOGGER SCOPE WARMER CLEARIFY (MISCELLANEOUS) ×4 IMPLANT
DERMABOND ADVANCED (GAUZE/BANDAGES/DRESSINGS) ×2
DERMABOND ADVANCED .7 DNX12 (GAUZE/BANDAGES/DRESSINGS) ×2 IMPLANT
DEVICE SUTURE ENDOST 10MM (ENDOMECHANICALS) ×4 IMPLANT
DRAPE CAMERA CLOSED 9X96 (DRAPES) ×4 IMPLANT
DRSG TEGADERM 2-3/8X2-3/4 SM (GAUZE/BANDAGES/DRESSINGS) IMPLANT
ENDOSTITCH 0 SINGLE 48 (SUTURE) ×4 IMPLANT
GAUZE SPONGE NON-WVN 2X2 STRL (MISCELLANEOUS) IMPLANT
GLOVE BIO SURGEON STRL SZ8 (GLOVE) ×20 IMPLANT
GLOVE INDICATOR 8.0 STRL GRN (GLOVE) ×4 IMPLANT
GOWN STRL REUS W/ TWL LRG LVL3 (GOWN DISPOSABLE) ×2 IMPLANT
GOWN STRL REUS W/ TWL XL LVL3 (GOWN DISPOSABLE) ×4 IMPLANT
GOWN STRL REUS W/TWL LRG LVL3 (GOWN DISPOSABLE) ×2
GOWN STRL REUS W/TWL XL LVL3 (GOWN DISPOSABLE) ×4
GRASPER SUT TROCAR 14GX15 (MISCELLANEOUS) ×4 IMPLANT
IRRIGATION STRYKERFLOW (MISCELLANEOUS) ×2 IMPLANT
IRRIGATOR STRYKERFLOW (MISCELLANEOUS) ×4
IV LACTATED RINGERS 1000ML (IV SOLUTION) ×8 IMPLANT
KIT PINK PAD W/HEAD ARE REST (MISCELLANEOUS) ×4
KIT PINK PAD W/HEAD ARM REST (MISCELLANEOUS) ×2 IMPLANT
KIT TURNOVER CYSTO (KITS) ×4 IMPLANT
LABEL OR SOLS (LABEL) ×4 IMPLANT
MANIPULATOR VCARE LG CRV RETR (MISCELLANEOUS) IMPLANT
MANIPULATOR VCARE SML CRV RETR (MISCELLANEOUS) ×4 IMPLANT
MANIPULATOR VCARE STD CRV RETR (MISCELLANEOUS) IMPLANT
NEEDLE INSUFFLATION 150MM (ENDOMECHANICALS) ×4 IMPLANT
NEEDLE VERESS 14GA 120MM (NEEDLE) ×4 IMPLANT
NS IRRIG 500ML POUR BTL (IV SOLUTION) ×4 IMPLANT
OCCLUDER COLPOPNEUMO (BALLOONS) ×4 IMPLANT
PACK GYN LAPAROSCOPIC (MISCELLANEOUS) ×4 IMPLANT
PAD OB MATERNITY 4.3X12.25 (PERSONAL CARE ITEMS) ×4 IMPLANT
PAD PREP 24X41 OB/GYN DISP (PERSONAL CARE ITEMS) ×4 IMPLANT
SCISSORS METZENBAUM CVD 33 (INSTRUMENTS) ×4 IMPLANT
SET CYSTO W/LG BORE CLAMP LF (SET/KITS/TRAYS/PACK) ×4 IMPLANT
SHEARS HARMONIC ACE PLUS 36CM (ENDOMECHANICALS) ×4 IMPLANT
SLEEVE ENDOPATH XCEL 5M (ENDOMECHANICALS) ×4 IMPLANT
SPONGE VERSALON 2X2 STRL (MISCELLANEOUS)
SUT ENDO VLOC 180-0-8IN (SUTURE) ×4 IMPLANT
SUT VIC AB 0 CT1 36 (SUTURE) ×8 IMPLANT
SUT VIC AB 2-0 CT1 27 (SUTURE) ×2
SUT VIC AB 2-0 CT1 TAPERPNT 27 (SUTURE) ×2 IMPLANT
SUT VIC AB 4-0 FS2 27 (SUTURE) ×4 IMPLANT
SYR 10ML LL (SYRINGE) ×4 IMPLANT
SYR 50ML LL SCALE MARK (SYRINGE) ×4 IMPLANT
TROCAR 5M 150ML BLDLS (TROCAR) ×4 IMPLANT
TROCAR ENDO BLADELESS 11MM (ENDOMECHANICALS) ×4 IMPLANT
TROCAR XCEL NON-BLD 5MMX100MML (ENDOMECHANICALS) ×4 IMPLANT
TUBING INSUF HEATED (TUBING) ×4 IMPLANT

## 2017-07-04 NOTE — Anesthesia Preprocedure Evaluation (Signed)
Anesthesia Evaluation  Patient identified by MRN, date of birth, ID band Patient awake    Reviewed: Allergy & Precautions, NPO status , Patient's Chart, lab work & pertinent test results, reviewed documented beta blocker date and time   Airway Mallampati: III  TM Distance: >3 FB     Dental  (+) Chipped   Pulmonary asthma , former smoker,           Cardiovascular hypertension,      Neuro/Psych PSYCHIATRIC DISORDERS Anxiety Depression    GI/Hepatic GERD  Controlled,  Endo/Other  diabetes, Type obesity  Renal/GU      Musculoskeletal  (+) Arthritis ,   Abdominal   Peds  Hematology   Anesthesia Other Findings   Reproductive/Obstetrics                             Anesthesia Physical Anesthesia Plan  ASA: III  Anesthesia Plan: General   Post-op Pain Management:    Induction: Intravenous  PONV Risk Score and Plan:   Airway Management Planned: Oral ETT  Additional Equipment:   Intra-op Plan:   Post-operative Plan:   Informed Consent: I have reviewed the patients History and Physical, chart, labs and discussed the procedure including the risks, benefits and alternatives for the proposed anesthesia with the patient or authorized representative who has indicated his/her understanding and acceptance.     Plan Discussed with: CRNA  Anesthesia Plan Comments:         Anesthesia Quick Evaluation

## 2017-07-04 NOTE — Discharge Instructions (Signed)
AMBULATORY SURGERY  DISCHARGE INSTRUCTIONS   1) The drugs that you were given will stay in your system until tomorrow so for the next 24 hours you should not:  A) Drive an automobile B) Make any legal decisions C) Drink any alcoholic beverage   2) You may resume regular meals tomorrow.  Today it is better to start with liquids and gradually work up to solid foods.  You may eat anything you prefer, but it is better to start with liquids, then soup and crackers, and gradually work up to solid foods.   3) Please notify your doctor immediately if you have any unusual bleeding, trouble breathing, redness and pain at the surgery site, drainage, fever, or pain not relieved by medication.    4) Additional Instructions:        Please contact your physician with any problems or Same Day Surgery at [phone], Monday through Friday 6 am to 4 pm, or [place] at [facility] number at [phone].Total Laparoscopic Hysterectomy, Care After Refer to this sheet in the next few weeks. These instructions provide you with information on caring for yourself after your procedure. Your health care provider may also give you more specific instructions. Your treatment has been planned according to current medical practices, but problems sometimes occur. Call your health care provider if you have any problems or questions after your procedure. What can I expect after the procedure?  Pain and bruising at the incision sites. You will be given pain medicine to control it.  Menopausal symptoms such as hot flashes, night sweats, and insomnia if your ovaries were removed.  Sore throat from the breathing tube that was inserted during surgery. Follow these instructions at home:  Only take over-the-counter or prescription medicines for pain, discomfort, or fever as directed by your health care provider.  Do not take aspirin. It can cause bleeding.  Do not drive when taking pain medicine.  Follow  your health care provider's advice regarding diet, exercise, lifting, driving, and general activities.  Resume your usual diet as directed and allowed.  Get plenty of rest and sleep.  Do not douche, use tampons, or have sexual intercourse for at least 6 weeks, or until your health care provider gives you permission.  Change your bandages (dressings) as directed by your health care provider.  Monitor your temperature and notify your health care provider of a fever.  Take showers instead of baths for 2-3 weeks.  Do not drink alcohol until your health care provider gives you permission.  If you develop constipation, you may take a mild laxative with your health care provider's permission. Bran foods may help with constipation problems. Drinking enough fluids to keep your urine clear or pale yellow may help as well.  Try to have someone home with you for 1-2 weeks to help around the house.  Keep all of your follow-up appointments as directed by your health care provider. Contact a health care provider if:  You have swelling, redness, or increasing pain around your incision sites.  You have pus coming from your incision.  You notice a bad smell coming from your incision.  Your incision breaks open.  You feel dizzy or lightheaded.  You have pain or bleeding when you urinate.  You have persistent diarrhea.  You have persistent nausea and vomiting.  You have abnormal vaginal discharge.  You have a rash.  You have any type of abnormal reaction or develop an allergy to your medicine.  You have poor pain  control with your prescribed medicine. Get help right away if:  You have chest pain or shortness of breath.  You have severe abdominal pain that is not relieved with pain medicine.  You have pain or swelling in your legs. This information is not intended to replace advice given to you by your health care provider. Make sure you discuss any questions you have with your health  care provider. Document Released: 03/11/2013 Document Revised: 10/27/2015 Document Reviewed: 12/09/2012 Elsevier Interactive Patient Education  2017 [employer]     AMBULATORY SURGERY  DISCHARGE INSTRUCTIONS   5) The drugs that you were given will stay in your system until tomorrow so for the next 24 hours you should not:  D) Drive an automobile E) Make any legal decisions F) Drink any alcoholic beverage   6) You may resume regular meals tomorrow.  Today it is better to start with liquids and gradually work up to solid foods.  You may eat anything you prefer, but it is better to start with liquids, then soup and crackers, and gradually work up to solid foods.   7) Please notify your doctor immediately if you have any unusual bleeding, trouble breathing, redness and pain at the surgery site, drainage, fever, or pain not relieved by medication.    8) Additional Instructions:        Please contact your physician with any problems or Same Day Surgery at [phone], Monday through Friday 6 am to 4 pm, or [place] at [facility] number at [phone].AMBULATORY SURGERY  DISCHARGE INSTRUCTIONS   9) The drugs that you were given will stay in your system until tomorrow so for the next 24 hours you should not:  G) Drive an automobile H) Make any legal decisions I) Drink any alcoholic beverage   10) You may resume regular meals tomorrow.  Today it is better to start with liquids and gradually work up to solid foods.  You may eat anything you prefer, but it is better to start with liquids, then soup and crackers, and gradually work up to solid foods.   11) Please notify your doctor immediately if you have any unusual bleeding, trouble breathing, redness and pain at the surgery site, drainage, fever, or pain not relieved by medication.    12) Additional Instructions:        Please contact your physician with any problems or Same Day Surgery at  [phone], Monday through Friday 6 am to 4 pm, or [place] at [facility] Main number at [phone].

## 2017-07-04 NOTE — Op Note (Signed)
Operative Report:  PRE-OP DIAGNOSIS: menomotrorrhagia, pelvic pain   POST-OP DIAGNOSIS: menomotrorrhagia, pelvic pain   PROCEDURE: Procedure(s): HYSTERECTOMY TOTAL LAPAROSCOPIC BILATERAL SALPINGECTOMY  SURGEON: [name], MD, FACOG  ASSISTANT: Dr Jean Rosenthal   ANESTHESIA: General endotracheal anesthesia  ESTIMATED BLOOD LOSS: less than 100   SPECIMENS: Uterus, Tubes.  COMPLICATIONS: None  DISPOSITION: stable to PACU  FINDINGS: Intraabdominal adhesions were minimally noted along the lower uterine segment noted. Normal ovaries.  PROCEDURE:  The patient was taken to the OR where anesthesia was administed. She was prepped and draped in the normal sterile fashion in the dorsal lithotomy position in the McMullen stirrups. A time out was performed. A Graves speculum was inserted, the cervix was grasped with a single tooth tenaculum and the endometrial cavity was sounded. The cervix was progressively dilated to a size 18 [place] with [employer] dilators. A V-Care uterine manipulator was inserted in the usual fashion without incident. Gloves were changed and attention was turned to the abdomen.   An infraumbilical transverse 5mm skin incision was made with the scalpel after local anesthesia applied to the skin. A Veress-step needle was inserted in the usual fashion and confirmed using the hanging drop technique. A pneumoperitoneum was obtained by insufflation of CO2 (opening pressure of ) to . A diagnostic laparoscopy was performed yielding the previously described findings. Attention was turned to the left lower quadrant where after visualization of the inferior epigastric vessels a 5mm skin incision was made with the scalpel. A 5 mm laparoscopic port was inserted. The same procedure was repeated in the right lower quadrant with a 11mm trocar. Attention was turned to the left aspect of the uterus, where after visualization of the ureter, the round ligament was coagulated and transected using the  5mm Harmonic Scapel. The anterior and posterior leafs of the broad ligament were dissected off as the anterior one was coagulated and transected in a caudal direction towards the cuff of the uterine manipulator.  Attention was then turned to the left fallopian tube which was recognized by visualization of the fimbria. The tube is excised to its attachment to the uterus. The uterine-ovarian ligament and its blood vessels were carefully coagulated and transected using the Harmonic scapel.  Attention was turned to the right aspect of the uterus where the same procedure was performed.  The vesicouterine reflection of the peritoneum was dissected with the harmonic scapel and the bladder flap was created bluntly.  The uterine vessels were coagulated and transected bilaterally using first bipolar cautery and then the harmonic scapel. A 360 degree, circumferential colpotomy was done to completely amputate the uterus with cervix and tubes. Once the specimen was amputated it was delivered through the vagina.   The colpotomy was repaired in a running fashion using a delayed absorbable suture with an endo-stitch device.  Vaginal exam confirms complete closure.  The cavity was copiously irrigated. A survey of the pelvic cavity revealed adequate hemostasis and no injury to bowel, bladder, or ureter.   A diagnostic cystoscopy was performed using saline distension of bladder with no lesions or injuries noted.  Bilateral urine flow from each ureteral orifice is visualized.  At this point the procedure was finalized. All the instruments were removed from the patient's body. Gas was expelled and patient is leveled.  Incisions are closed with skin adhesive.    Patient goes to recovery room in stable condition.  All sponge, instrument, and needle counts are correct x2.     [name], MD, FACOG [facility], [facility] 07/04/2017  9:20 AM

## 2017-07-04 NOTE — Anesthesia Procedure Notes (Deleted)
Procedure Name: Intubation Date/Time: 07/04/2017 7:38 AM Performed by: Ginger CarneMichelet, Detravion Tester, CRNA Pre-anesthesia Checklist: Patient identified, Emergency Drugs available, Suction available, Patient being monitored and Timeout performed Patient Re-evaluated:Patient Re-evaluated prior to induction Oxygen Delivery Method: Circle system utilized Preoxygenation: Pre-oxygenation with 100% oxygen Induction Type: IV induction and Cricoid Pressure applied Ventilation: Mask ventilation without difficulty and Oral airway inserted - appropriate to patient size Laryngoscope Size: Hyacinth MeekerMiller and 2 Grade View: Grade II Tube type: Oral Tube size: 7.5 mm Number of attempts: 1 Airway Equipment and Method: Stylet Placement Confirmation: ETT inserted through vocal cords under direct vision,  positive ETCO2 and breath sounds checked- equal and bilateral Secured at: 22 cm Tube secured with: Tape Dental Injury: Teeth and Oropharynx as per pre-operative assessment  Comments: Floppy epiglotis

## 2017-07-04 NOTE — Anesthesia Procedure Notes (Signed)
Procedure Name: Intubation Date/Time: 07/04/2017 7:38 AM Performed by: Karoline CaldwellStarr, Virginia Hulme, CRNA Pre-anesthesia Checklist: Patient identified, Patient being monitored, Timeout performed, Emergency Drugs available and Suction available Patient Re-evaluated:Patient Re-evaluated prior to induction Oxygen Delivery Method: Circle system utilized Preoxygenation: Pre-oxygenation with 100% oxygen Induction Type: IV induction Ventilation: Mask ventilation without difficulty and Oral airway inserted - appropriate to patient size Laryngoscope Size: 3 and McGraph Grade View: Grade I Tube type: Oral Tube size: 7.0 mm Number of attempts: 1 Airway Equipment and Method: Stylet Placement Confirmation: ETT inserted through vocal cords under direct vision,  positive ETCO2 and breath sounds checked- equal and bilateral Secured at: 21 cm Tube secured with: Tape Dental Injury: Teeth and Oropharynx as per pre-operative assessment  Difficulty Due To: Difficulty was anticipated, Difficult Airway- due to anterior larynx and Difficult Airway- due to large tongue

## 2017-07-04 NOTE — Anesthesia Postprocedure Evaluation (Signed)
Anesthesia Post Note  Patient: Virginia Peterson  Procedure(s) Performed: HYSTERECTOMY TOTAL LAPAROSCOPIC BILATERAL SALPINGECTOMY (Bilateral ) CYSTOSCOPY (N/A )  Patient location during evaluation: PACU Anesthesia Type: General Level of consciousness: awake and alert Pain management: pain level controlled Vital Signs Assessment: post-procedure vital signs reviewed and stable Respiratory status: spontaneous breathing, nonlabored ventilation, respiratory function stable and patient connected to nasal cannula oxygen Cardiovascular status: blood pressure returned to baseline and stable Postop Assessment: no apparent nausea or vomiting Anesthetic complications: no     Last Vitals:  Vitals:   07/04/17 1047 07/04/17 1100  BP: (!) 124/55 (!) 120/59  Pulse: 85 94  Resp:    Temp:  37.1 C  SpO2: 94% 98%    Last Pain:  Vitals:   07/04/17 1145  TempSrc:   PainSc: 5                  Danika Kluender S

## 2017-07-04 NOTE — Transfer of Care (Signed)
Immediate Anesthesia Transfer of Care Note  Patient: Virginia ShutterKelly Closser  Procedure(s) Performed: HYSTERECTOMY TOTAL LAPAROSCOPIC BILATERAL SALPINGECTOMY (Bilateral )  Patient Location: PACU  Anesthesia Type:General  Level of Consciousness: awake, alert  and oriented  Airway & Oxygen Therapy: Patient Spontanous Breathing and Patient connected to face mask oxygen  Post-op Assessment: Report given to RN and Post -op Vital signs reviewed and stable  Post vital signs: Reviewed and stable  Last Vitals:  Vitals:   07/04/17 0605 07/04/17 0933  BP: (!) 150/74 (!) 153/75  Pulse: 84 98  Resp: 18 12  Temp: 37.2 C 36.4 C  SpO2: 99% 98%    Last Pain:  Vitals:   07/04/17 0605  TempSrc: Tympanic  PainSc: 8          Complications: No apparent anesthesia complications

## 2017-07-04 NOTE — H&P (Signed)
History and Physical Interval Note:  07/04/2017 7:13 AM  Virginia Peterson  has presented today for surgery, with the diagnosis of menomotrorrhagia, pelvic pain  The various methods of treatment have been discussed with the patient and family. After consideration of risks, benefits and other options for treatment, the patient has consented to  Procedure(s): HYSTERECTOMY TOTAL LAPAROSCOPIC BILATERAL SALPINGECTOMY (Bilateral) as a surgical intervention .  The patient's history has been reviewed, patient examined, no change in status, stable for surgery.  Pt has the following beta blocker history-  Not taking Beta Blocker.  I have reviewed the patient's chart and labs.  Questions were answered to the patient's satisfaction.    Annamarie MajorPaul Teshawn Moan, MD, Merlinda FrederickFACOG [facility], [facility] 07/04/2017  7:13 AM

## 2017-07-04 NOTE — OR Nursing (Signed)
Pt moving around and walking well. Voided x 2.  Discharge instructions discussed with pt and family. Both voice understanding.

## 2017-07-04 NOTE — Anesthesia Post-op Follow-up Note (Signed)
Anesthesia QCDR form completed.        

## 2017-07-05 LAB — SURGICAL PATHOLOGY

## 2017-07-15 ENCOUNTER — Other Ambulatory Visit: Payer: Self-pay | Admitting: Gastroenterology

## 2017-07-15 DIAGNOSIS — K219 Gastro-esophageal reflux disease without esophagitis: Secondary | ICD-10-CM

## 2017-07-16 ENCOUNTER — Encounter: Payer: BLUE CROSS/BLUE SHIELD | Admitting: Obstetrics & Gynecology

## 2017-07-16 ENCOUNTER — Other Ambulatory Visit: Payer: BLUE CROSS/BLUE SHIELD

## 2017-07-19 ENCOUNTER — Encounter: Payer: Self-pay | Admitting: Obstetrics & Gynecology

## 2017-07-19 ENCOUNTER — Ambulatory Visit (INDEPENDENT_AMBULATORY_CARE_PROVIDER_SITE_OTHER): Payer: BLUE CROSS/BLUE SHIELD | Admitting: Obstetrics & Gynecology

## 2017-07-19 VITALS — BP 130/80 | HR 84 | Ht 64.0 in | Wt 216.0 lb

## 2017-07-19 DIAGNOSIS — R102 Pelvic and perineal pain: Secondary | ICD-10-CM

## 2017-07-19 DIAGNOSIS — N921 Excessive and frequent menstruation with irregular cycle: Secondary | ICD-10-CM

## 2017-07-19 NOTE — Progress Notes (Signed)
  Postoperative Follow-up [facility], 2 weeks ago. Images:   Pathology: DIAGNOSIS:  A. 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  - ENDOMETRIUM WITH DECIDUALIZED STROMA AND BREAKDOWN.  - UNREMARKABLE MYOMETRIUM.  - RIGHT FALLOPIAN TUBE WITH BENIGN PARATUBAL CYST.  - UNREMARKABLE LEFT FALLOPIAN TUBE.  - NEGATIVE FOR ATYPIA AND MALIGNANCY.   Subjective: Patient reports marked improvement in her preop symptoms. Eating a regular diet without difficulty. The patient is not having any pain.  Activity: normal activities of daily living. Patient reports vaginal sx's of None  Objective: LMP 06/25/2017  Physical Exam  Constitutional: She is oriented to person, place, and time. She appears well-developed and well-nourished. No distress.  Cardiovascular: Normal rate.  Pulmonary/Chest: Effort normal.  Abdominal: Soft. She exhibits no distension. There is no tenderness.  Incision Healing Well   Musculoskeletal: Normal range of motion.  Neurological: She is alert and oriented to person, place, and time. No cranial nerve deficit.  Skin: Skin is warm and dry.  Psychiatric: She has a normal mood and affect.   Assessment: s/p :  total laparoscopic hysterectomy with bilateral salpingectomy stable  Plan: Patient has done well after surgery with no apparent complications.  I have discussed the post-operative course to date, and the expected progress moving forward.  The patient understands what complications to be concerned about.  I will see the patient in routine follow up, or sooner if needed.    Activity plan: No heavy lifting. Pelvic rest  Virginia LibraRobert Peterson Virginia Peterson 07/19/2017, 10:49 AM

## 2017-07-29 ENCOUNTER — Other Ambulatory Visit: Payer: Self-pay | Admitting: Gastroenterology

## 2017-07-29 DIAGNOSIS — K219 Gastro-esophageal reflux disease without esophagitis: Secondary | ICD-10-CM

## 2017-07-31 ENCOUNTER — Telehealth: Payer: Self-pay

## 2017-07-31 NOTE — Telephone Encounter (Signed)
Pt wants to know if PH wants her to continue taking the bcp or stop them.  [phone].

## 2017-07-31 NOTE — Telephone Encounter (Signed)
No need for bcp since having the hysterectomy

## 2017-08-01 NOTE — Telephone Encounter (Signed)
Left detailed msg that she can stop bcp.

## 2017-08-06 ENCOUNTER — Other Ambulatory Visit: Payer: Self-pay | Admitting: Obstetrics & Gynecology

## 2017-08-07 ENCOUNTER — Ambulatory Visit (INDEPENDENT_AMBULATORY_CARE_PROVIDER_SITE_OTHER): Payer: BLUE CROSS/BLUE SHIELD | Admitting: Obstetrics & Gynecology

## 2017-08-07 ENCOUNTER — Ambulatory Visit: Payer: BLUE CROSS/BLUE SHIELD | Admitting: Obstetrics & Gynecology

## 2017-08-07 ENCOUNTER — Encounter: Payer: Self-pay | Admitting: Obstetrics & Gynecology

## 2017-08-07 ENCOUNTER — Other Ambulatory Visit: Payer: Self-pay | Admitting: Gastroenterology

## 2017-08-07 VITALS — BP 140/80 | Ht 64.0 in | Wt 215.0 lb

## 2017-08-07 DIAGNOSIS — K219 Gastro-esophageal reflux disease without esophagitis: Secondary | ICD-10-CM

## 2017-08-07 DIAGNOSIS — R102 Pelvic and perineal pain: Secondary | ICD-10-CM

## 2017-08-07 DIAGNOSIS — Z6841 Body Mass Index (BMI) 40.0 and over, adult: Secondary | ICD-10-CM

## 2017-08-07 DIAGNOSIS — N938 Other specified abnormal uterine and vaginal bleeding: Secondary | ICD-10-CM

## 2017-08-07 NOTE — Progress Notes (Signed)
  Postoperative Follow-up Patient [facility] uterine bleeding and pelvic pain, 6 weeks ago.  Subjective: Patient reports marked improvement in her preop symptoms. Eating a regular diet without difficulty. The patient is not having any pain.  Activity: normal activities of daily living. Patient reports vaginal sx's of None  Objective: BP 140/80   Ht 5\' 4"  (1.626 m)   Wt 215 lb (97.5 kg)   LMP 06/25/2017   BMI 36.90 kg/m  Physical Exam  Constitutional: She is oriented to person, place, and time. She appears well-developed and well-nourished. No distress.  Genitourinary: Rectum normal and vagina normal. Pelvic exam was performed with patient supine. There is no rash, tenderness or lesion on the right labia. There is no rash, tenderness or lesion on the left labia. No erythema or bleeding in the vagina. Right adnexum does not display mass and does not display tenderness. Left adnexum does not display mass and does not display tenderness.  Genitourinary Comments: Cervix and uterus absent. Vaginal cuff healing well. Stitch seen not all the way dissolved/resorbed, tissue intact though  Cardiovascular: Normal rate.  Pulmonary/Chest: Effort normal.  Abdominal: Soft. She exhibits no distension. There is no tenderness.  Incision healing well.  Musculoskeletal: Normal range of motion.  Neurological: She is alert and oriented to person, place, and time. No cranial nerve deficit.  Skin: Skin is warm and dry.  Psychiatric: She has a normal mood and affect.   Assessment: s/p :  total laparoscopic hysterectomy with bilateral salpingectomy stable  Plan: Patient has done well after surgery with no apparent complications.  I have discussed the post-operative course to date, and the expected progress moving forward.  The patient understands what complications to be concerned about.  I will see the patient in routine follow up, or sooner if needed.    Activity plan: No  restriction.  Weight loss discussed  Letitia LibraRobert Paul Ilyaas Musto 08/07/2017, 2:17 PM

## 2017-08-14 ENCOUNTER — Ambulatory Visit: Payer: BLUE CROSS/BLUE SHIELD | Admitting: Obstetrics & Gynecology

## 2017-08-16 ENCOUNTER — Other Ambulatory Visit: Payer: Self-pay | Admitting: Gastroenterology

## 2017-08-16 DIAGNOSIS — K219 Gastro-esophageal reflux disease without esophagitis: Secondary | ICD-10-CM

## 2017-09-03 ENCOUNTER — Telehealth: Payer: Self-pay | Admitting: Gastroenterology

## 2017-09-03 ENCOUNTER — Ambulatory Visit: Payer: BLUE CROSS/BLUE SHIELD | Admitting: Gastroenterology

## 2017-09-19 ENCOUNTER — Ambulatory Visit: Payer: BLUE CROSS/BLUE SHIELD | Admitting: Gastroenterology

## 2017-09-27 NOTE — Telephone Encounter (Signed)
error 

## 2017-12-08 ENCOUNTER — Emergency Department: Payer: BLUE CROSS/BLUE SHIELD

## 2017-12-08 ENCOUNTER — Emergency Department
Admission: EM | Admit: 2017-12-08 | Discharge: 2017-12-08 | Disposition: A | Discharge status: Home / Self Care | Payer: BLUE CROSS/BLUE SHIELD | Attending: Emergency Medicine | Admitting: Emergency Medicine

## 2017-12-08 ENCOUNTER — Other Ambulatory Visit: Payer: Self-pay

## 2017-12-08 DIAGNOSIS — R0602 Shortness of breath: Secondary | ICD-10-CM | POA: Insufficient documentation

## 2017-12-08 DIAGNOSIS — Z87891 Personal history of nicotine dependence: Secondary | ICD-10-CM | POA: Diagnosis not present

## 2017-12-08 DIAGNOSIS — E119 Type 2 diabetes mellitus without complications: Secondary | ICD-10-CM | POA: Diagnosis not present

## 2017-12-08 DIAGNOSIS — R0789 Other chest pain: Secondary | ICD-10-CM

## 2017-12-08 DIAGNOSIS — I10 Essential (primary) hypertension: Secondary | ICD-10-CM | POA: Diagnosis not present

## 2017-12-08 DIAGNOSIS — Z79899 Other long term (current) drug therapy: Secondary | ICD-10-CM | POA: Insufficient documentation

## 2017-12-08 LAB — CBC
HCT: 38.3 % (ref 35.0–47.0)
Hemoglobin: 12.8 g/dL (ref 12.0–16.0)
MCH: 28.1 pg (ref 26.0–34.0)
MCHC: 33.3 g/dL (ref 32.0–36.0)
MCV: 84.3 fL (ref 80.0–100.0)
Platelets: 449 10*3/uL — ABNORMAL HIGH (ref 150–440)
RBC: 4.54 MIL/uL (ref 3.80–5.20)
RDW: 14.6 % — AB (ref 11.5–14.5)
WBC: 12.2 10*3/uL — ABNORMAL HIGH (ref 3.6–11.0)

## 2017-12-08 LAB — BASIC METABOLIC PANEL
Anion gap: 10 (ref 5–15)
BUN: 17 mg/dL (ref 6–20)
CALCIUM: 9.3 mg/dL (ref 8.9–10.3)
CHLORIDE: 103 mmol/L (ref 98–111)
CO2: 23 mmol/L (ref 22–32)
CREATININE: 0.74 mg/dL (ref 0.44–1.00)
GFR calc Af Amer: >60 mL/min (ref >60)
GFR calc non Af Amer: >60 mL/min (ref >60)
GLUCOSE: 148 mg/dL — AB (ref 70–99)
Potassium: 4.5 mmol/L (ref 3.5–5.1)
Sodium: 136 mmol/L (ref 135–145)

## 2017-12-08 LAB — TROPONIN I: Troponin I: <0.03 ng/mL (ref <0.03)

## 2017-12-08 MED ORDER — IPRATROPIUM-ALBUTEROL 0.5-2.5 (3) MG/3ML IN SOLN
3.0000 mL | Freq: Once | RESPIRATORY_TRACT | Status: AC
  Administered 2017-12-08: 3 mL via RESPIRATORY_TRACT
  Filled 2017-12-08: qty 3

## 2017-12-08 MED ORDER — KETOROLAC TROMETHAMINE 30 MG/ML IJ SOLN
30.0000 mg | Freq: Once | INTRAMUSCULAR | Status: DC
  Filled 2017-12-08: qty 1

## 2017-12-08 MED ORDER — NAPROXEN 500 MG PO TABS: 500.0000 mg | ORAL_TABLET | Freq: Two times a day (BID) | ORAL | 2 refills | Status: DC

## 2017-12-08 MED ORDER — KETOROLAC TROMETHAMINE 30 MG/ML IJ SOLN
30.0000 mg | Freq: Once | INTRAMUSCULAR | Status: AC
  Administered 2017-12-08: 30 mg via INTRAVENOUS

## 2017-12-08 NOTE — ED Triage Notes (Signed)
Pt arrived via POV with reports of chest tightness that feels like someone is punching her in the chest. Pt states she has hx of asthma and has been using inhalers and nebs at home without relief.   Pt states "i'm worried I'm having a heart attack"

## 2017-12-08 NOTE — ED Notes (Signed)

## 2017-12-08 NOTE — ED Notes (Signed)
.   Pt is resting, Respirations even and unlabored, NAD. Stretcher lowest postion and locked. Call bell within reach. Denies any needs at this time RN will continue to monitor.    

## 2017-12-08 NOTE — ED Provider Notes (Signed)
[facility] Emergency Department Provider Note   ____________________________________________    I have reviewed the triage vital signs and the nursing notes.   HISTORY  Chief Complaint Chest Pain and Shortness of Breath     HPI Virginia Peterson is a 42 y.o. female who presents with complaints of chest pain.  She describes a feeling of mild diffuse chest tightness constantly over the last week.  She reports mild shortness of breath as well.  Ports a history of asthma.  She also states she has been under significant stress.  Also complains of dry mouth.  Denies radiation of discomfort, has not taken anything for this.  No fevers or chills or cough.  No calf pain or swelling.  No recent travel.  No pleurisy.  Past Medical History:  Diagnosis Date  . Anxiety   . Asthma    WELL CONTROLLED  . Depressed   . Diabetes mellitus without complication (HCC)   . Dysthymia 2016  . GERD (gastroesophageal reflux disease)   . Hyperlipidemia   . Hypertension     Patient Active Problem List   Diagnosis Date Noted  . Pelvic pain in female 06/28/2017  . Morbid obesity with BMI of 40.0-44.9, adult (HCC) 10/03/2016  . Asthma without status asthmaticus 07/20/2016  . Diabetes mellitus type 2, uncomplicated (HCC) 07/20/2016  . Disc disease, degenerative, lumbar or lumbosacral 07/20/2016  . Generalized anxiety disorder 07/20/2016  . Hyperlipidemia, unspecified 07/20/2016  . Hypertension 07/20/2016  . Pain syndrome, chronic 07/20/2016  . Depression 10/06/2015  . Adjustment disorder with mixed anxiety and depressed mood 10/06/2015    Past Surgical History:  Procedure Laterality Date  . benign neck tumor  2012  . CESAREAN SECTION    . CYSTOSCOPY N/A 07/04/2017   Procedure: CYSTOSCOPY;  Surgeon: Nadara Mustard, MD;  Location: [facility] ORS;  Service: Gynecology;  Laterality: N/A;  . ESOPHAGOGASTRODUODENOSCOPY (EGD) WITH PROPOFOL N/A 10/12/2016   Procedure:  ESOPHAGOGASTRODUODENOSCOPY (EGD) WITH PROPOFOL;  Surgeon: Wyline Mood, MD;  Location: [facility] ENDOSCOPY;  Service: Endoscopy;  Laterality: N/A;  . LAPAROSCOPIC HYSTERECTOMY Bilateral 07/04/2017   Procedure: HYSTERECTOMY TOTAL LAPAROSCOPIC BILATERAL SALPINGECTOMY;  Surgeon: Nadara Mustard, MD;  Location: [facility] ORS;  Service: Gynecology;  Laterality: Bilateral;    Prior to Admission medications   Medication Sig Start Date End Date Taking? Authorizing Provider  albuterol (ACCUNEB) 1.25 MG/3ML nebulizer solution Inhale 1 ampule into the lungs every 6 (six) hours as needed for wheezing or shortness of breath.  06/18/17 06/18/18  [provider]  albuterol (PROVENTIL HFA;VENTOLIN HFA) 108 (90 BASE) MCG/ACT inhaler Inhale 2 puffs into the lungs every 6 (six) hours as needed for wheezing or shortness of breath. 04/25/15   Tommi Rumps, PA-C  ALPRAZolam (XANAX) 0.25 MG tablet TAKE 1 TABLET (0.25 MG TOTAL) BY MOUTH DAILY AS NEEDED FOR ANXIETY 05/17/17   [provider]  amoxicillin (AMOXIL) 500 MG capsule Take 1 capsule (500 mg total) by mouth 3 (three) times daily. Patient not taking: Reported on 06/28/2017 05/16/17   Faythe Ghee, PA-C  aspirin-acetaminophen-caffeine (EXCEDRIN MIGRAINE) [phone] MG tablet Take 2 tablets by mouth every 8 (eight) hours as needed for headache (PT HAS NOT STARTED TAKING YET).     [provider]  atorvastatin (LIPITOR) 20 MG tablet Take 20 mg by mouth at bedtime.  09/28/16 09/28/17  [provider]  azithromycin (ZITHROMAX) 250 MG tablet 2 TABS ON DAY ONE, THEN 1 TAB DAILY X 4 DAYS 07/23/17  [provider]  escitalopram (LEXAPRO) 20 MG tablet Take 30 mg by mouth at bedtime. Takes 1.5 tablets    [provider]  fluticasone (FLONASE) 50 MCG/ACT nasal spray SHAKE LQ AND U 2 SPRAYS IEN QD PRF RHINITIS OR ALLERGIES 08/14/16   [provider]  glipiZIDE (GLUCOTROL) 10 MG tablet Take 1 tablet (10 mg total) by mouth 2  (two) times daily. 06/13/16 07/04/17  Jene Every, MD  lisinopril (PRINIVIL,ZESTRIL) 10 MG tablet Take 1 tablet (10 mg total) by mouth daily. Patient taking differently: Take 10 mg by mouth 2 (two) times daily.  06/13/16 06/28/17  Jene Every, MD  loperamide (IMODIUM A-D) 2 MG tablet Take 1 tablet (2 mg total) by mouth 4 (four) times daily as needed for diarrhea or loose stools. 02/22/17   Rockne Menghini, MD  meloxicam (MOBIC) 15 MG tablet Take 15 mg by mouth at bedtime.    [provider]  naproxen (NAPROSYN) 500 MG tablet Take 1 tablet (500 mg total) by mouth 2 (two) times daily with a meal. 12/08/17   Jene Every, MD  omeprazole (PRILOSEC) 40 MG capsule TAKE 1 CAPSULE BY MOUTH TWICE A DAY 07/30/17   Wyline Mood, MD  oxyCODONE-acetaminophen (PERCOCET) 5-325 MG tablet Take 1 tablet by mouth every 4 (four) hours as needed for moderate pain or severe pain. Patient not taking: Reported on 08/07/2017 07/04/17   Nadara Mustard, MD  SYNJARDY XR 12.10-998 MG TB24 Take 2 tablets by mouth every morning.  10/10/16   [provider]     Allergies Tramadol and Tegaderm ag mesh [silver]  Family History  Problem Relation Age of Onset  . Diabetes Mother   . Diabetes Father   . Diabetes Maternal Grandfather   . Diabetes Paternal Grandmother     Social History Social History   Tobacco Use  . Smoking status: Former Smoker    Years: 4.00    Types: Cigarettes    Last attempt to quit: 06/28/2004    Years since quitting: 13.4  . Smokeless tobacco: Never Used  . Tobacco comment: 1 CIG DAILY  Substance Use Topics  . Alcohol use: No  . Drug use: No    Review of Systems  Constitutional: No fever/chills Eyes: No visual changes.  ENT: No sore throat. Cardiovascular: As above Respiratory: Denies shortness of breath. Gastrointestinal: No abdominal pain.  No nausea, no vomiting.   Genitourinary: Negative for dysuria. Musculoskeletal: Negative for back pain. Skin: Negative for  rash. Neurological: Negative for headaches   ____________________________________________   PHYSICAL EXAM:  VITAL SIGNS: ED Triage Vitals  Enc Vitals Group     BP 12/08/17 1017 132/72     Pulse Rate 12/08/17 1017 95     Resp 12/08/17 1017 20     Temp 12/08/17 1017 98.4 F (36.9 C)     Temp Source 12/08/17 1017 Oral     SpO2 12/08/17 1017 97 %     Weight 12/08/17 1017 93.9 kg (207 lb)     Height 12/08/17 1017 1.626 m (5\' 4" )     Head Circumference --      Peak Flow --      Pain Score 12/08/17 1022 10     Pain Loc --      Pain Edu? --      Excl. in GC? --     Constitutional: Alert and oriented. No acute distress. Pleasant and interactive Eyes: Conjunctivae are normal.  . Nose: No congestion/rhinnorhea. Mouth/Throat: Mucous membranes are moist.  Cardiovascular: Normal rate, regular rhythm. Grossly normal heart sounds.  Good peripheral circulation. Respiratory: Normal respiratory effort.  No retractions.  Scattered mild wheezes Gastrointestinal: Soft and nontender. No distention.   Genitourinary: deferred Musculoskeletal: No lower extremity tenderness nor edema.  Warm and well perfused Neurologic:  Normal speech and language. No gross focal neurologic deficits are appreciated.  Skin:  Skin is warm, dry and intact. No rash noted. Psychiatric: Mood and affect are normal. Speech and behavior are normal.  ____________________________________________   LABS (all labs ordered are listed, but only abnormal results are displayed)  Labs Reviewed  BASIC METABOLIC PANEL - Abnormal; Notable for the following components:      Result Value   Glucose, Bld 148 (*)    All other components within normal limits  CBC - Abnormal; Notable for the following components:   WBC 12.2 (*)    RDW 14.6 (*)    Platelets 449 (*)    All other components within normal limits  TROPONIN I  POC URINE PREG, ED   ____________________________________________  EKG  ED ECG REPORT I, Jene Everyobert  Lorely Bubb, the attending physician, personally viewed and interpreted this ECG.  Date: 12/08/2017  Rhythm: normal sinus rhythm QRS Axis: normal Intervals: normal ST/T Wave abnormalities: normal Narrative Interpretation: no evidence of acute ischemia  ____________________________________________  RADIOLOGY  Normal chest x-ray, no pneumonia ____________________________________________   PROCEDURES  Procedure(s) performed: No  Procedures   Critical Care performed: No ____________________________________________   INITIAL IMPRESSION / ASSESSMENT AND PLAN / ED COURSE  Pertinent labs & imaging results that were available during my care of the patient were reviewed by me and considered in my medical decision making (see chart for details).  Patient well-appearing and in no acute distress.  EKG is reassuring.  Scattered mild wheezes on exam, suspect bronchospasm as the cause of her chest tightness.  Will treat with DuoNeb's, check labs including chest x-ray.  Patient had improvement with DuoNeb, lab work is unremarkable, chest x-ray negative.  Probably a component of chest wall discomfort, given Toradol with even more relief.  Patient continues to complain of dry mouth which is been occurring over the last several weeks, unclear etiology for this, referred to PCP.    ____________________________________________   FINAL CLINICAL IMPRESSION(S) / ED DIAGNOSES  Final diagnoses:  Atypical chest pain        Note:  This document was prepared using Dragon voice recognition software and may include unintentional dictation errors.    Jene EveryKinner, Kody Brandl, MD 12/08/17 1344

## 2017-12-12 ENCOUNTER — Ambulatory Visit: Payer: BLUE CROSS/BLUE SHIELD | Admitting: Obstetrics & Gynecology

## 2018-01-20 ENCOUNTER — Telehealth: Payer: Self-pay

## 2018-01-20 MED ORDER — TERCONAZOLE 0.4 % VA CREA: 1.0000 | TOPICAL_CREAM | Freq: Every day | VAGINAL | 0 refills | Status: DC

## 2018-01-20 NOTE — Telephone Encounter (Signed)
Pt is itching and burning in vaginal area, having white discharge. Pt has no insurance. Would like something to take for it preferably a creme. Pt gets medication through a medication clinic  Phone number for clinic [phone], fax [phone]. Please advise

## 2018-01-20 NOTE — Telephone Encounter (Signed)
ERx done and cancel appt next week

## 2018-01-20 NOTE — Telephone Encounter (Signed)
Pt aware and appt canceled

## 2018-01-29 ENCOUNTER — Telehealth: Payer: Self-pay | Admitting: Pharmacy Technician

## 2018-01-29 NOTE — Telephone Encounter (Signed)
Received updated proof of income.  Patient eligible to receive medication assistance at [facility] as long as eligibility requirements continue to be met.  Virginia Peterson Virginia Peterson Care Manager [facility] 

## 2018-01-31 ENCOUNTER — Ambulatory Visit: Payer: Self-pay | Admitting: Obstetrics & Gynecology

## 2018-03-03 ENCOUNTER — Other Ambulatory Visit: Payer: Self-pay

## 2018-03-03 ENCOUNTER — Emergency Department: Payer: Self-pay

## 2018-03-03 ENCOUNTER — Encounter: Payer: Self-pay | Admitting: Emergency Medicine

## 2018-03-03 ENCOUNTER — Emergency Department
Admission: EM | Admit: 2018-03-03 | Discharge: 2018-03-03 | Disposition: A | Discharge status: Home / Self Care | Payer: Self-pay | Attending: Emergency Medicine | Admitting: Emergency Medicine

## 2018-03-03 DIAGNOSIS — R0602 Shortness of breath: Secondary | ICD-10-CM | POA: Insufficient documentation

## 2018-03-03 DIAGNOSIS — Z7984 Long term (current) use of oral hypoglycemic drugs: Secondary | ICD-10-CM | POA: Insufficient documentation

## 2018-03-03 DIAGNOSIS — Z79899 Other long term (current) drug therapy: Secondary | ICD-10-CM | POA: Insufficient documentation

## 2018-03-03 DIAGNOSIS — I10 Essential (primary) hypertension: Secondary | ICD-10-CM | POA: Insufficient documentation

## 2018-03-03 DIAGNOSIS — Z87891 Personal history of nicotine dependence: Secondary | ICD-10-CM | POA: Insufficient documentation

## 2018-03-03 DIAGNOSIS — J45909 Unspecified asthma, uncomplicated: Secondary | ICD-10-CM | POA: Insufficient documentation

## 2018-03-03 DIAGNOSIS — R110 Nausea: Secondary | ICD-10-CM | POA: Insufficient documentation

## 2018-03-03 DIAGNOSIS — E119 Type 2 diabetes mellitus without complications: Secondary | ICD-10-CM | POA: Insufficient documentation

## 2018-03-03 DIAGNOSIS — R079 Chest pain, unspecified: Secondary | ICD-10-CM | POA: Insufficient documentation

## 2018-03-03 LAB — BASIC METABOLIC PANEL
Anion gap: 9 (ref 5–15)
BUN: 21 mg/dL — AB (ref 6–20)
CALCIUM: 9.2 mg/dL (ref 8.9–10.3)
CO2: 24 mmol/L (ref 22–32)
CREATININE: 0.65 mg/dL (ref 0.44–1.00)
Chloride: 103 mmol/L (ref 98–111)
Glucose, Bld: 52 mg/dL — ABNORMAL LOW (ref 70–99)
Potassium: 4 mmol/L (ref 3.5–5.1)
SODIUM: 136 mmol/L (ref 135–145)

## 2018-03-03 LAB — CBC
HCT: 33.7 % — ABNORMAL LOW (ref 35.0–47.0)
Hemoglobin: 11.5 g/dL — ABNORMAL LOW (ref 12.0–16.0)
MCH: 28.2 pg (ref 26.0–34.0)
MCHC: 34.2 g/dL (ref 32.0–36.0)
MCV: 82.4 fL (ref 80.0–100.0)
PLATELETS: 400 10*3/uL (ref 150–440)
RBC: 4.09 MIL/uL (ref 3.80–5.20)
RDW: 14.6 % — AB (ref 11.5–14.5)
WBC: 9.1 10*3/uL (ref 3.6–11.0)

## 2018-03-03 LAB — TROPONIN I

## 2018-03-03 MED ORDER — IBUPROFEN 400 MG PO TABS
600.0000 mg | ORAL_TABLET | Freq: Once | ORAL | Status: AC
  Administered 2018-03-03: 600 mg via ORAL
  Filled 2018-03-03: qty 2

## 2018-03-03 MED ORDER — SUCRALFATE 1 G PO TABS: 1.0000 g | ORAL_TABLET | Freq: Four times a day (QID) | ORAL | 0 refills | Status: DC

## 2018-03-03 MED ORDER — GI COCKTAIL ~~LOC~~
30.0000 mL | Freq: Once | ORAL | Status: AC
  Administered 2018-03-03: 30 mL via ORAL
  Filled 2018-03-03: qty 30

## 2018-03-03 MED ORDER — KETOROLAC TROMETHAMINE 60 MG/2ML IM SOLN
60.0000 mg | Freq: Once | INTRAMUSCULAR | Status: DC
  Filled 2018-03-03: qty 2

## 2018-03-03 NOTE — ED Provider Notes (Signed)
[facility] Emergency Department Provider Note  ____________________________________________   I have reviewed the triage vital signs and the nursing notes.   HISTORY  Chief Complaint Chest Pain   History limited by: Not Limited   HPI Virginia Peterson is a 42 y.o. female who presents to the emergency department today because of concerns for chest pain.  It is been present for roughly 1 week.  Is located in her center chest.  There is some radiation to her right arm.  She describes the pain as pressure-like.  She states it is been constant.  She has not had any associated shortness of breath.  She has had some nausea.  Patient denies any fevers.  States she is tried both her inhaler at home as well as anxiety medications for this and neither of helped.  Per medical record review patient has a history of anxiety and anxiety, DM, HTN, HLD, GERD.  Past Medical History:  Diagnosis Date  . Anxiety   . Asthma    WELL CONTROLLED  . Depressed   . Diabetes mellitus without complication (HCC)   . Dysthymia 2016  . GERD (gastroesophageal reflux disease)   . Hyperlipidemia   . Hypertension     Patient Active Problem List   Diagnosis Date Noted  . Pelvic pain in female 06/28/2017  . Morbid obesity with BMI of 40.0-44.9, adult (HCC) 10/03/2016  . Asthma without status asthmaticus 07/20/2016  . Diabetes mellitus type 2, uncomplicated (HCC) 07/20/2016  . Disc disease, degenerative, lumbar or lumbosacral 07/20/2016  . Generalized anxiety disorder 07/20/2016  . Hyperlipidemia, unspecified 07/20/2016  . Hypertension 07/20/2016  . Pain syndrome, chronic 07/20/2016  . Depression 10/06/2015  . Adjustment disorder with mixed anxiety and depressed mood 10/06/2015    Past Surgical History:  Procedure Laterality Date  . benign neck tumor  2012  . CESAREAN SECTION    . CYSTOSCOPY N/A 07/04/2017   Procedure: CYSTOSCOPY;  Surgeon: Nadara Mustard, MD;  Location: [facility] ORS;   Service: Gynecology;  Laterality: N/A;  . ESOPHAGOGASTRODUODENOSCOPY (EGD) WITH PROPOFOL N/A 10/12/2016   Procedure: ESOPHAGOGASTRODUODENOSCOPY (EGD) WITH PROPOFOL;  Surgeon: Wyline Mood, MD;  Location: [facility] ENDOSCOPY;  Service: Endoscopy;  Laterality: N/A;  . LAPAROSCOPIC HYSTERECTOMY Bilateral 07/04/2017   Procedure: HYSTERECTOMY TOTAL LAPAROSCOPIC BILATERAL SALPINGECTOMY;  Surgeon: Nadara Mustard, MD;  Location: [facility] ORS;  Service: Gynecology;  Laterality: Bilateral;    Prior to Admission medications   Medication Sig Start Date End Date Taking? Authorizing Provider  albuterol (ACCUNEB) 1.25 MG/3ML nebulizer solution Inhale 1 ampule into the lungs every 6 (six) hours as needed for wheezing or shortness of breath.  06/18/17 06/18/18  [provider]  albuterol (PROVENTIL HFA;VENTOLIN HFA) 108 (90 BASE) MCG/ACT inhaler Inhale 2 puffs into the lungs every 6 (six) hours as needed for wheezing or shortness of breath. 04/25/15   Tommi Rumps, PA-C  ALPRAZolam (XANAX) 0.25 MG tablet TAKE 1 TABLET (0.25 MG TOTAL) BY MOUTH DAILY AS NEEDED FOR ANXIETY 05/17/17   [provider]  amoxicillin (AMOXIL) 500 MG capsule Take 1 capsule (500 mg total) by mouth 3 (three) times daily. Patient not taking: Reported on 06/28/2017 05/16/17   Faythe Ghee, PA-C  aspirin-acetaminophen-caffeine (EXCEDRIN MIGRAINE) [phone] MG tablet Take 2 tablets by mouth every 8 (eight) hours as needed for headache (PT HAS NOT STARTED TAKING YET).     [provider]  atorvastatin (LIPITOR) 20 MG tablet Take 20 mg by mouth at bedtime.  09/28/16 09/28/17  [provider]  azithromycin (ZITHROMAX) 250 MG tablet 2 TABS ON DAY ONE, THEN 1 TAB DAILY X 4 DAYS 07/23/17   [provider]  escitalopram (LEXAPRO) 20 MG tablet Take 30 mg by mouth at bedtime. Takes 1.5 tablets    [provider]  fluticasone (FLONASE) 50 MCG/ACT nasal spray SHAKE LQ AND U 2 SPRAYS IEN QD PRF RHINITIS OR  ALLERGIES 08/14/16   [provider]  glipiZIDE (GLUCOTROL) 10 MG tablet Take 1 tablet (10 mg total) by mouth 2 (two) times daily. 06/13/16 07/04/17  Jene Every, MD  lisinopril (PRINIVIL,ZESTRIL) 10 MG tablet Take 1 tablet (10 mg total) by mouth daily. Patient taking differently: Take 10 mg by mouth 2 (two) times daily.  06/13/16 06/28/17  Jene Every, MD  loperamide (IMODIUM A-D) 2 MG tablet Take 1 tablet (2 mg total) by mouth 4 (four) times daily as needed for diarrhea or loose stools. 02/22/17   Rockne Menghini, MD  meloxicam (MOBIC) 15 MG tablet Take 15 mg by mouth at bedtime.    [provider]  naproxen (NAPROSYN) 500 MG tablet Take 1 tablet (500 mg total) by mouth 2 (two) times daily with a meal. 12/08/17   Jene Every, MD  omeprazole (PRILOSEC) 40 MG capsule TAKE 1 CAPSULE BY MOUTH TWICE A DAY 07/30/17   Wyline Mood, MD  oxyCODONE-acetaminophen (PERCOCET) 5-325 MG tablet Take 1 tablet by mouth every 4 (four) hours as needed for moderate pain or severe pain. Patient not taking: Reported on 08/07/2017 07/04/17   Nadara Mustard, MD  SYNJARDY XR 12.10-998 MG TB24 Take 2 tablets by mouth every morning.  10/10/16   [provider]  terconazole (TERAZOL 7) 0.4 % vaginal cream Place 1 applicator vaginally at bedtime. 01/20/18   Nadara Mustard, MD    Allergies Tramadol and Tegaderm ag mesh [silver]  Family History  Problem Relation Age of Onset  . Diabetes Mother   . Diabetes Father   . Diabetes Maternal Grandfather   . Diabetes Paternal Grandmother     Social History Social History   Tobacco Use  . Smoking status: Former Smoker    Years: 4.00    Types: Cigarettes    Last attempt to quit: 06/28/2004    Years since quitting: 13.6  . Smokeless tobacco: Never Used  . Tobacco comment: 1 CIG DAILY  Substance Use Topics  . Alcohol use: No  . Drug use: No    Review of Systems Constitutional: No fever/chills Eyes: No visual changes. ENT: No sore  throat. Cardiovascular: Positive for chest pain. Respiratory: Denies shortness of breath. Gastrointestinal: No abdominal pain.  No nausea, no vomiting.  No diarrhea.   Genitourinary: Negative for dysuria. Musculoskeletal: Positive for right arm pain.  Skin: Negative for rash. Neurological: Negative for headaches, focal weakness or numbness.  ____________________________________________   PHYSICAL EXAM:  VITAL SIGNS: ED Triage Vitals  Enc Vitals Group     BP 03/03/18 1452 122/66     Pulse Rate 03/03/18 1452 83     Resp 03/03/18 1452 20     Temp 03/03/18 1452 98 F (36.7 C)     Temp Source 03/03/18 1452 Oral     SpO2 03/03/18 1452 100 %     Weight 03/03/18 1453 214 lb (97.1 kg)     Height 03/03/18 1453 5\' 4"  (1.626 m)     Head Circumference --      Peak Flow --      Pain Score 03/03/18 1452 9  Constitutional: Alert and oriented.  Eyes: Conjunctivae are normal.  ENT      Head: Normocephalic and atraumatic.      Nose: No congestion/rhinnorhea.      Mouth/Throat: Mucous membranes are moist.      Neck: No stridor. Hematological/Lymphatic/Immunilogical: No cervical lymphadenopathy. Cardiovascular: Normal rate, regular rhythm.  No murmurs, rubs, or gallops.  Respiratory: Normal respiratory effort without tachypnea nor retractions. Breath sounds are clear and equal bilaterally. No wheezes/rales/rhonchi. Gastrointestinal: Soft and non tender. No rebound. No guarding.  Genitourinary: Deferred Musculoskeletal: Normal range of motion in all extremities. No lower extremity edema. Neurologic:  Normal speech and language. No gross focal neurologic deficits are appreciated.  Skin:  Skin is warm, dry and intact. No rash noted. Psychiatric: Mood and affect are normal. Speech and behavior are normal. Patient exhibits appropriate insight and judgment.  ____________________________________________    LABS (pertinent positives/negatives)  Trop <0.03 CBC wbc 9.1, hgb 11.5, plt 400 BMP  wnl except glu 52, bun 21  ____________________________________________   EKG  I, Phineas Semen, attending physician, personally viewed and interpreted this EKG  EKG Time: 1454 Rate: 77 Rhythm: normal sinus rhythm Axis: normal Intervals: qtc 450 QRS: narrow ST changes: no st elevation Impression: normal ekg   ____________________________________________    RADIOLOGY  CXR Normal chest   ____________________________________________   PROCEDURES  Procedures  ____________________________________________   INITIAL IMPRESSION / ASSESSMENT AND PLAN / ED COURSE  Pertinent labs & imaging results that were available during my care of the patient were reviewed by me and considered in my medical decision making (see chart for details).   Patient presented to the emergency department because of concerns for chest pain.  Differential would be broad including PE dissection pneumonia pneumothorax costochondritis esophagitis anxiety.  Patient blood work and x-ray without concerning findings.  She did get some relief with GI cocktail.  Did have a discussion.  Sounds like the patient is under a lot of stress.  Do think this could be contributing to the patient's symptoms.  Encouraged patient to re-follow-up with [facility].  ____________________________________________   FINAL CLINICAL IMPRESSION(S) / ED DIAGNOSES  Final diagnoses:  Nonspecific chest pain     Note: This dictation was prepared with Dragon dictation. Any transcriptional errors that result from this process are unintentional     Phineas Semen, MD 03/03/18 1821

## 2018-03-03 NOTE — Discharge Instructions (Addendum)
Please seek medical attention for any high fevers, chest pain, shortness of breath, change in behavior, persistent vomiting, bloody stool or any other new or concerning symptoms.  

## 2018-03-03 NOTE — ED Triage Notes (Signed)
Pt arrived via POV with reports of chest pain and right arm pain x 2 weeks, shortness of breath and back pain. Pt reports she also had diarrhea x 2 days   Pt reports 3 episodes of diarrhea that is watery stools today.  Pt states she tried nebs, inhaler and xanax at home with no relief of sxs.

## 2018-04-06 ENCOUNTER — Other Ambulatory Visit: Payer: Self-pay

## 2018-04-06 ENCOUNTER — Emergency Department
Admission: EM | Admit: 2018-04-06 | Discharge: 2018-04-06 | Disposition: A | Discharge status: Home / Self Care | Payer: Self-pay | Attending: Emergency Medicine | Admitting: Emergency Medicine

## 2018-04-06 DIAGNOSIS — R109 Unspecified abdominal pain: Secondary | ICD-10-CM

## 2018-04-06 DIAGNOSIS — Z87891 Personal history of nicotine dependence: Secondary | ICD-10-CM | POA: Insufficient documentation

## 2018-04-06 DIAGNOSIS — N3000 Acute cystitis without hematuria: Secondary | ICD-10-CM | POA: Insufficient documentation

## 2018-04-06 DIAGNOSIS — R110 Nausea: Secondary | ICD-10-CM | POA: Insufficient documentation

## 2018-04-06 DIAGNOSIS — E119 Type 2 diabetes mellitus without complications: Secondary | ICD-10-CM | POA: Insufficient documentation

## 2018-04-06 DIAGNOSIS — I10 Essential (primary) hypertension: Secondary | ICD-10-CM | POA: Insufficient documentation

## 2018-04-06 DIAGNOSIS — J45909 Unspecified asthma, uncomplicated: Secondary | ICD-10-CM | POA: Insufficient documentation

## 2018-04-06 LAB — URINALYSIS, COMPLETE (UACMP) WITH MICROSCOPIC
BILIRUBIN URINE: NEGATIVE
HGB URINE DIPSTICK: NEGATIVE
KETONES UR: NEGATIVE mg/dL
Nitrite: NEGATIVE
Protein, ur: NEGATIVE mg/dL
Specific Gravity, Urine: 1.033 — ABNORMAL HIGH (ref 1.005–1.030)
pH: 5 (ref 5.0–8.0)

## 2018-04-06 LAB — COMPREHENSIVE METABOLIC PANEL
ALBUMIN: 4.3 g/dL (ref 3.5–5.0)
ALT: 17 U/L (ref 0–44)
ANION GAP: 7 (ref 5–15)
AST: 15 U/L (ref 15–41)
Alkaline Phosphatase: 48 U/L (ref 38–126)
BILIRUBIN TOTAL: 0.3 mg/dL (ref 0.3–1.2)
BUN: 16 mg/dL (ref 6–20)
CO2: 27 mmol/L (ref 22–32)
Calcium: 9.2 mg/dL (ref 8.9–10.3)
Chloride: 104 mmol/L (ref 98–111)
Creatinine, Ser: 0.79 mg/dL (ref 0.44–1.00)
GFR calc Af Amer: >60 mL/min (ref >60)
GFR calc non Af Amer: >60 mL/min (ref >60)
GLUCOSE: 104 mg/dL — AB (ref 70–99)
POTASSIUM: 4.1 mmol/L (ref 3.5–5.1)
SODIUM: 138 mmol/L (ref 135–145)
Total Protein: 7.6 g/dL (ref 6.5–8.1)

## 2018-04-06 LAB — CBC
HCT: 37.2 % (ref 36.0–46.0)
HEMOGLOBIN: 11.9 g/dL — AB (ref 12.0–15.0)
MCH: 27.2 pg (ref 26.0–34.0)
MCHC: 32 g/dL (ref 30.0–36.0)
MCV: 84.9 fL (ref 80.0–100.0)
Platelets: 464 10*3/uL — ABNORMAL HIGH (ref 150–400)
RBC: 4.38 MIL/uL (ref 3.87–5.11)
RDW: 14.1 % (ref 11.5–15.5)
WBC: 10 10*3/uL (ref 4.0–10.5)
nRBC: 0 % (ref 0.0–0.2)

## 2018-04-06 LAB — LIPASE, BLOOD: Lipase: 38 U/L (ref 11–51)

## 2018-04-06 MED ORDER — CEPHALEXIN 500 MG PO CAPS
500.0000 mg | ORAL_CAPSULE | Freq: Once | ORAL | Status: AC
  Administered 2018-04-06: 500 mg via ORAL
  Filled 2018-04-06: qty 1

## 2018-04-06 MED ORDER — ONDANSETRON 4 MG PO TBDP: 4.0000 mg | ORAL_TABLET | Freq: Three times a day (TID) | ORAL | 0 refills | Status: DC | PRN

## 2018-04-06 MED ORDER — CEPHALEXIN 500 MG PO CAPS: 500.0000 mg | ORAL_CAPSULE | Freq: Four times a day (QID) | ORAL | 0 refills | Status: AC

## 2018-04-06 MED ORDER — KETOROLAC TROMETHAMINE 10 MG PO TABS
10.0000 mg | ORAL_TABLET | Freq: Once | ORAL | Status: AC
  Administered 2018-04-06: 10 mg via ORAL
  Filled 2018-04-06: qty 1

## 2018-04-06 NOTE — ED Triage Notes (Addendum)
Pt states lower abd pain shooting to back x few weeks. Hx of UTI, denies hx of kidney stones. States "a little" dysuria. States vomiting "clear"   A&O, ambulatory.   Pt also c/o of R shoulder pain and states when her hands fall asleep they get hot and wants to have that checked out as well.

## 2018-04-06 NOTE — ED Notes (Signed)
Patient c/o lower abdominal pain radiating to left back described as sharp. Patient reports dysuria, denies vaginal discharge or bleeding. Patient reports malodorous urine. Patient reports hx of hysterectomy with fallopian tube removal; patient reports she still has both ovaries. Patient reports hx of ovarian cysts.   Patient c/o nausea and dry heaves. Patient denies actual emesis, however reports clear spit when she dry heaves. Patient denies diarrhea, reports normal bowel habits. Patient is tender to palpation of abdomen.

## 2018-04-06 NOTE — Discharge Instructions (Addendum)
Drink plenty of fluids to stay well-hydrated and to help clear your urinary tract infection.  Please take the entire course of antibiotics, even if you are feeling better.  Zofran is for nausea.  Please follow-up with Dr. Judithann Sheen; he can follow-up the results of your urine culture.  Return to the emergency department if you develop severe pain, lightheadedness or fainting, fever, inability to keep down fluids, or any other symptoms concerning to you.

## 2018-04-06 NOTE — ED Notes (Signed)
Reviewed discharge instructions, follow-up care, and prescriptions with patient. Patient verbalized understanding of all information reviewed. Patient stable, with no distress noted at this time.    

## 2018-04-06 NOTE — ED Notes (Signed)
ED Provider at bedside. 

## 2018-04-06 NOTE — ED Notes (Signed)
FIRST NURSE NOTE: Pt ambulatory into lobby and talking on the phone. Pt stood away from check in desk until called over and then did not hang up phone. pt continued to talk on the phone while being registered.

## 2018-04-06 NOTE — ED Provider Notes (Signed)
[facility] Emergency Department Provider Note  ____________________________________________  Time seen: Approximately 9:56 PM  I have reviewed the triage vital signs and the nursing notes.   HISTORY  Chief Complaint Abdominal Pain    HPI Janise Gora is a 42 y.o. female with a history of anxiety and depression, DM, HTN, HL, status post hysterectomy, presenting with left flank pain radiating to the left lower quadrant and dysuria.  The symptoms have been going on for 3 weeks.  She has had some mild nausea but no vomiting.  The pain is constant and nothing makes it better or worse.  She has tried Tylenol and Motrin without improvement.  She denies any fevers or chills, hematuria, she has occasional constipation but no diarrhea.  Past Medical History:  Diagnosis Date  . Anxiety   . Asthma    WELL CONTROLLED  . Depressed   . Diabetes mellitus without complication (HCC)   . Dysthymia 2016  . GERD (gastroesophageal reflux disease)   . Hyperlipidemia   . Hypertension     Patient Active Problem List   Diagnosis Date Noted  . Pelvic pain in female 06/28/2017  . Morbid obesity with BMI of 40.0-44.9, adult (HCC) 10/03/2016  . Asthma without status asthmaticus 07/20/2016  . Diabetes mellitus type 2, uncomplicated (HCC) 07/20/2016  . Disc disease, degenerative, lumbar or lumbosacral 07/20/2016  . Generalized anxiety disorder 07/20/2016  . Hyperlipidemia, unspecified 07/20/2016  . Hypertension 07/20/2016  . Pain syndrome, chronic 07/20/2016  . Depression 10/06/2015  . Adjustment disorder with mixed anxiety and depressed mood 10/06/2015    Past Surgical History:  Procedure Laterality Date  . benign neck tumor  2012  . CESAREAN SECTION    . CYSTOSCOPY N/A 07/04/2017   Procedure: CYSTOSCOPY;  Surgeon: Nadara Mustard, MD;  Location: [facility] ORS;  Service: Gynecology;  Laterality: N/A;  . ESOPHAGOGASTRODUODENOSCOPY (EGD) WITH PROPOFOL N/A 10/12/2016   Procedure:  ESOPHAGOGASTRODUODENOSCOPY (EGD) WITH PROPOFOL;  Surgeon: Wyline Mood, MD;  Location: [facility] ENDOSCOPY;  Service: Endoscopy;  Laterality: N/A;  . LAPAROSCOPIC HYSTERECTOMY Bilateral 07/04/2017   Procedure: HYSTERECTOMY TOTAL LAPAROSCOPIC BILATERAL SALPINGECTOMY;  Surgeon: Nadara Mustard, MD;  Location: [facility] ORS;  Service: Gynecology;  Laterality: Bilateral;    Current Outpatient Rx  . Order #: [id number] Class: Historical Med  . Order #: [id number] Class: Print  . Order #: [id number] Class: Historical Med  . Order #: [id number] Class: Print  . Order #: [id number] Class: Historical Med  . Order #: [id number] Class: Historical Med  . Order #: [id number] Class: Historical Med  . Order #: [id number] Class: Historical Med  . Order #: [id number] Class: Historical Med  . Order #: [id number] Class: Print  . Order #: [id number] Class: Print  . Order #: [id number] Class: Print  . Order #: [id number] Class: Historical Med  . Order #: [id number] Class: Print  . Order #: [id number] Class: Normal  . Order #: [id number] Class: Normal  . Order #: [id number] Class: Print  . Order #: [id number] Class: Historical Med  . Order #: [id number] Class: Normal    Allergies Tramadol and Tegaderm ag mesh [silver]  Family History  Problem Relation Age of Onset  . Diabetes Mother   . Diabetes Father   . Diabetes Maternal Grandfather   . Diabetes Paternal Grandmother     Social History Social History   Tobacco Use  . Smoking status: Former Smoker    Years: 4.00    Types: Cigarettes    Last attempt to quit: 06/28/2004    Years since quitting: 13.7  . Smokeless tobacco: Never Used  . Tobacco  comment: 1 CIG DAILY  Substance Use Topics  . Alcohol use: No  . Drug use: No    Review of Systems Constitutional: No fever/chills.  No lightheadedness or syncope. Eyes: No visual changes. ENT: No sore throat. No congestion or rhinorrhea. Cardiovascular: Denies chest pain. Denies palpitations. Respiratory: Denies shortness of breath.  No  cough. Gastrointestinal: As of left flank pain radiating to the left lower quadrant.  +nausea, no vomiting.  No diarrhea.  No constipation. Genitourinary: Positive for dysuria.  No urinary frequency.  No hematuria.  No change in vaginal discharge; patient states she has not been sexually active in a long time. Musculoskeletal: Negative for back pain. Skin: Negative for rash. Neurological: Negative for headaches. No focal numbness, tingling or weakness.     ____________________________________________   PHYSICAL EXAM:  VITAL SIGNS: ED Triage Vitals  Enc Vitals Group     BP 04/06/18 1811 (!) 147/78     Pulse Rate 04/06/18 1811 96     Resp 04/06/18 1811 18     Temp 04/06/18 1811 98.1 F (36.7 C)     Temp Source 04/06/18 1811 Oral     SpO2 04/06/18 1811 99 %     Weight 04/06/18 1812 215 lb (97.5 kg)     Height 04/06/18 1812 5\' 4"  (1.626 m)     Head Circumference --      Peak Flow --      Pain Score --      Pain Loc --      Pain Edu? --      Excl. in GC? --     Constitutional: Alert and oriented.  Answers questions appropriately. Eyes: Conjunctivae are normal.  EOMI. No scleral icterus. Head: Atraumatic. Nose: No congestion/rhinnorhea. Mouth/Throat: Mucous membranes are moist.  Neck: No stridor.  Supple.   Cardiovascular: Normal rate, regular rhythm. No murmurs, rubs or gallops.  Respiratory: Normal respiratory effort.  No accessory muscle use or retractions. Lungs CTAB.  No wheezes, rales or ronchi. Gastrointestinal: Obese.  Soft, and nondistended.  Minimal diffuse suprapubic tenderness to palpation.  No guarding or rebound.  No peritoneal signs. Musculoskeletal: No LE edema. Neurologic:  A&Ox3.  Speech is clear.  Face and smile are symmetric.  EOMI.  Moves all extremities well. Skin:  Skin is warm, dry and intact. No rash noted. Psychiatric: Mood and affect are normal.   ____________________________________________   LABS (all labs ordered are listed, but only abnormal  results are displayed)  Labs Reviewed  COMPREHENSIVE METABOLIC PANEL - Abnormal; Notable for the following components:      Result Value   Glucose, Bld 104 (*)    All other components within normal limits  CBC - Abnormal; Notable for the following components:   Hemoglobin 11.9 (*)    Platelets 464 (*)    All other components within normal limits  URINALYSIS, COMPLETE (UACMP) WITH MICROSCOPIC - Abnormal; Notable for the following components:   Color, Urine YELLOW (*)    APPearance HAZY (*)    Specific Gravity, Urine 1.033 (*)    Glucose, UA >=500 (*)    Leukocytes, UA TRACE (*)    Bacteria, UA RARE (*)    All other components within normal limits  URINE CULTURE  LIPASE, BLOOD   ____________________________________________  EKG  Not indicated ____________________________________________  RADIOLOGY  No results found.  ____________________________________________   PROCEDURES  Procedure(s) performed: None  Procedures  Critical Care performed: No ____________________________________________   INITIAL IMPRESSION / ASSESSMENT AND PLAN / ED COURSE  Pertinent labs & imaging results that were available during my care of the patient were reviewed by me and considered in my medical decision making (see chart for details).  42 y.o. female with a history of HTN, HL, DM, depression and anxiety, presenting with left flank pain radiating to the left lower quadrant with dysuria.  Overall, the patient is afebrile and has a reassuring examination.  Her laboratory studies do show signs of a UTI and I have sent a urine culture.  The patient will receive a dose of Keflex here.  She has a normal white blood cell count and normal electrolytes with normal creatinine.  The most likely etiology for the patient's symptoms is a UTI.  Diverticulitis, constipation, and renal colic are considered but much less likely.  We will plan to treat the patient symptomatically and have her take a 7-day  course of Keflex.  She will follow-up with a primary care physician.  Return precautions were discussed.  ____________________________________________  FINAL CLINICAL IMPRESSION(S) / ED DIAGNOSES  Final diagnoses:  Acute cystitis without hematuria  Left flank pain  Nausea without vomiting         NEW MEDICATIONS STARTED DURING THIS VISIT:  New Prescriptions   No medications on file      Rockne Menghini, MD 04/06/18 2201

## 2018-04-08 LAB — URINE CULTURE

## 2018-05-07 ENCOUNTER — Encounter: Payer: Self-pay | Admitting: Obstetrics and Gynecology

## 2018-05-07 ENCOUNTER — Telehealth: Payer: Self-pay

## 2018-05-07 ENCOUNTER — Ambulatory Visit (INDEPENDENT_AMBULATORY_CARE_PROVIDER_SITE_OTHER): Payer: Self-pay | Admitting: Obstetrics and Gynecology

## 2018-05-07 VITALS — BP 108/70 | Ht 64.0 in | Wt 228.0 lb

## 2018-05-07 DIAGNOSIS — S39012A Strain of muscle, fascia and tendon of lower back, initial encounter: Secondary | ICD-10-CM

## 2018-05-07 DIAGNOSIS — N760 Acute vaginitis: Secondary | ICD-10-CM

## 2018-05-07 DIAGNOSIS — R300 Dysuria: Secondary | ICD-10-CM

## 2018-05-07 LAB — POCT URINALYSIS DIPSTICK
Bilirubin, UA: NEGATIVE
GLUCOSE UA: POSITIVE — AB
Ketones, UA: NEGATIVE
LEUKOCYTES UA: NEGATIVE
NITRITE UA: NEGATIVE
PROTEIN UA: NEGATIVE
RBC UA: NEGATIVE
Spec Grav, UA: 1.02 (ref 1.010–1.025)
pH, UA: 6 (ref 5.0–8.0)

## 2018-05-07 LAB — POCT WET PREP WITH KOH
Clue Cells Wet Prep HPF POC: NEGATIVE
KOH PREP POC: NEGATIVE
TRICHOMONAS UA: NEGATIVE
Yeast Wet Prep HPF POC: NEGATIVE

## 2018-05-07 MED ORDER — FLUCONAZOLE 150 MG PO TABS: 150.0000 mg | ORAL_TABLET | Freq: Once | ORAL | 0 refills | Status: AC

## 2018-05-07 NOTE — Telephone Encounter (Signed)
Pt had a bunch of blood come out while urinating yesterday; none today but still has pain on left side today.  [phone]  Adv to be seen.  Pt doesn't have ins.  Wants to be billed. Tx'd to SP to sched.

## 2018-05-07 NOTE — Patient Instructions (Signed)
I value your feedback and entrusting us with your care. If you get a [facility] patient survey, I would appreciate you taking the time to let us know about your experience today. Thank you! 

## 2018-05-07 NOTE — Progress Notes (Signed)
Virginia Arbour, MD   Chief Complaint  Patient presents with  . Urinary Tract Infection    itchiness, little odor, saw blood when she wiped last night, having left lower back pain, urinary frequency, no burning sensation or discharge all this since yesterday    HPI:      Virginia Peterson is a 42 y.o. G1P1001 who LMP was Patient's last menstrual period was 06/25/2017., presents today for vaginal irritation with odor and dysuria since yesterday. Noticed blood with wiping one time but unsure if vaginal or from urine. No increased d/c. Pt then also developed significant LT LBP and buttocks pain last night. Hurt so bad she couldn't sleep. Pt concerned about kidney stones. Pt is s/p hyst 3/19. Pos recent abx use for UTI a few months ago. Pt is not sex active.    Past Medical History:  Diagnosis Date  . Anxiety   . Asthma    WELL CONTROLLED  . Depressed   . Diabetes mellitus without complication (HCC)   . Dysthymia 2016  . GERD (gastroesophageal reflux disease)   . Hyperlipidemia   . Hypertension     Past Surgical History:  Procedure Laterality Date  . benign neck tumor  2012  . CESAREAN SECTION    . CYSTOSCOPY N/A 07/04/2017   Procedure: CYSTOSCOPY;  Surgeon: Nadara Mustard, MD;  Location: [facility] ORS;  Service: Gynecology;  Laterality: N/A;  . ESOPHAGOGASTRODUODENOSCOPY (EGD) WITH PROPOFOL N/A 10/12/2016   Procedure: ESOPHAGOGASTRODUODENOSCOPY (EGD) WITH PROPOFOL;  Surgeon: Wyline Mood, MD;  Location: [facility] ENDOSCOPY;  Service: Endoscopy;  Laterality: N/A;  . LAPAROSCOPIC HYSTERECTOMY Bilateral 07/04/2017   Procedure: HYSTERECTOMY TOTAL LAPAROSCOPIC BILATERAL SALPINGECTOMY;  Surgeon: Nadara Mustard, MD;  Location: [facility] ORS;  Service: Gynecology;  Laterality: Bilateral;    Family History  Problem Relation Age of Onset  . Diabetes Mother   . Diabetes Father   . Diabetes Maternal Grandfather   . Diabetes Paternal Grandmother     Social History   Socioeconomic History  .  Marital status: Married    Spouse name: Not on file  . Number of children: Not on file  . Years of education: Not on file  . Highest education level: Not on file  Occupational History  . Not on file  Social Needs  . Financial resource strain: Not on file  . Food insecurity:    Worry: Not on file    Inability: Not on file  . Transportation needs:    Medical: Not on file    Non-medical: Not on file  Tobacco Use  . Smoking status: Former Smoker    Years: 4.00    Types: Cigarettes    Last attempt to quit: 06/28/2004    Years since quitting: 13.8  . Smokeless tobacco: Never Used  . Tobacco comment: 1 CIG DAILY  Substance and Sexual Activity  . Alcohol use: No  . Drug use: No  . Sexual activity: Not Currently    Birth control/protection: None  Lifestyle  . Physical activity:    Days per week: Not on file    Minutes per session: Not on file  . Stress: Not on file  Relationships  . Social connections:    Talks on phone: Not on file    Gets together: Not on file    Attends religious service: Not on file    Active member of club or organization: Not on file    Attends meetings of clubs or organizations: Not on file  Relationship status: Not on file  . Intimate partner violence:    Fear of current or ex partner: Not on file    Emotionally abused: Not on file    Physically abused: Not on file    Forced sexual activity: Not on file  Other Topics Concern  . Not on file  Social History Narrative  . Not on file    Outpatient Medications Prior to Visit  Medication Sig Dispense Refill  . albuterol (ACCUNEB) 1.25 MG/3ML nebulizer solution Inhale 1 ampule into the lungs every 6 (six) hours as needed for wheezing or shortness of breath.     Marland Kitchen albuterol (PROVENTIL HFA;VENTOLIN HFA) 108 (90 BASE) MCG/ACT inhaler Inhale 2 puffs into the lungs every 6 (six) hours as needed for wheezing or shortness of breath. 1 Inhaler 2  . ALPRAZolam (XANAX) 1 MG tablet Take by mouth.    .  escitalopram (LEXAPRO) 20 MG tablet Take 30 mg by mouth at bedtime. Takes 1.5 tablets    . fluticasone (FLONASE) 50 MCG/ACT nasal spray SHAKE LQ AND U 2 SPRAYS IEN QD PRF RHINITIS OR ALLERGIES  3  . Fluticasone-Salmeterol (ADVAIR) 250-50 MCG/DOSE AEPB Inhale into the lungs.    . loperamide (IMODIUM A-D) 2 MG tablet Take 1 tablet (2 mg total) by mouth 4 (four) times daily as needed for diarrhea or loose stools. 12 tablet 0  . omeprazole (PRILOSEC) 20 MG capsule Take by mouth.    . ondansetron (ZOFRAN ODT) 4 MG disintegrating tablet Take 1 tablet (4 mg total) by mouth every 8 (eight) hours as needed for nausea or vomiting. 6 tablet 0  . SYNJARDY XR 12.10-998 MG TB24 Take 2 tablets by mouth every morning.   4  . terconazole (TERAZOL 7) 0.4 % vaginal cream Place 1 applicator vaginally at bedtime. 45 g 0  . atorvastatin (LIPITOR) 20 MG tablet Take 20 mg by mouth at bedtime.     . clindamycin (CLINDAGEL) 1 % gel Apply topically.    Marland Kitchen glipiZIDE (GLUCOTROL) 10 MG tablet Take 1 tablet (10 mg total) by mouth 2 (two) times daily. 60 tablet 0  . lisinopril (PRINIVIL,ZESTRIL) 10 MG tablet Take 1 tablet (10 mg total) by mouth daily. (Patient taking differently: Take 10 mg by mouth 2 (two) times daily. ) 30 tablet 0  . phentermine (ADIPEX-P) 37.5 MG tablet Take by mouth.    . ALPRAZolam (XANAX) 0.25 MG tablet TAKE 1 TABLET (0.25 MG TOTAL) BY MOUTH DAILY AS NEEDED FOR ANXIETY  1  . amoxicillin (AMOXIL) 500 MG capsule Take 1 capsule (500 mg total) by mouth 3 (three) times daily. (Patient not taking: Reported on 06/28/2017) 30 capsule 0  . aspirin-acetaminophen-caffeine (EXCEDRIN MIGRAINE) 250-250-65 MG tablet Take 2 tablets by mouth every 8 (eight) hours as needed for headache (PT HAS NOT STARTED TAKING YET).     Marland Kitchen azithromycin (ZITHROMAX) 250 MG tablet 2 TABS ON DAY ONE, THEN 1 TAB DAILY X 4 DAYS  0  . meloxicam (MOBIC) 15 MG tablet Take 15 mg by mouth at bedtime.    . naproxen (NAPROSYN) 500 MG tablet Take 1  tablet (500 mg total) by mouth 2 (two) times daily with a meal. 20 tablet 2  . omeprazole (PRILOSEC) 40 MG capsule TAKE 1 CAPSULE BY MOUTH TWICE A DAY 30 capsule 0  . oxyCODONE-acetaminophen (PERCOCET) 5-325 MG tablet Take 1 tablet by mouth every 4 (four) hours as needed for moderate pain or severe pain. (Patient not taking: Reported on 08/07/2017) 30 tablet  0  . sucralfate (CARAFATE) 1 g tablet Take 1 tablet (1 g total) by mouth 4 (four) times daily. 60 tablet 0   No facility-administered medications prior to visit.       ROS:  Review of Systems  Constitutional: Negative for fatigue, fever and unexpected weight change.  Respiratory: Negative for cough, shortness of breath and wheezing.   Cardiovascular: Negative for chest pain, palpitations and leg swelling.  Gastrointestinal: Negative for blood in stool, constipation, diarrhea, nausea and vomiting.  Endocrine: Negative for cold intolerance, heat intolerance and polyuria.  Genitourinary: Positive for dysuria, frequency and vaginal bleeding. Negative for dyspareunia, flank pain, genital sores, hematuria, menstrual problem, pelvic pain, urgency, vaginal discharge and vaginal pain.  Musculoskeletal: Positive for back pain. Negative for joint swelling and myalgias.  Skin: Negative for rash.  Neurological: Negative for dizziness, syncope, light-headedness, numbness and headaches.  Hematological: Negative for adenopathy.  Psychiatric/Behavioral: Positive for agitation and dysphoric mood. Negative for confusion, sleep disturbance and suicidal ideas. The patient is not nervous/anxious.    OBJECTIVE:   Vitals:  BP 108/70   Ht 5\' 4"  (1.626 m)   Wt 228 lb (103.4 kg)   LMP 06/25/2017   BMI 39.14 kg/m   Physical Exam  Constitutional: She is oriented to person, place, and time. Vital signs are normal. She appears well-developed.  Pulmonary/Chest: Effort normal.  Abdominal: There is no CVA tenderness.  Genitourinary: There is no rash,  tenderness or lesion on the right labia. There is no rash, tenderness or lesion on the left labia. Uterus is not tender. Right adnexum displays no mass and no tenderness. Left adnexum displays no mass and no tenderness. There is erythema in the vagina. No tenderness in the vagina. No vaginal discharge found.  Musculoskeletal: Normal range of motion.       Back:  Neurological: She is alert and oriented to person, place, and time.  Psychiatric: She has a normal mood and affect. Her behavior is normal. Thought content normal.  Vitals reviewed.   Results: Results for orders placed or performed in visit on 05/07/18 (from the past 24 hour(s))  POCT Urinalysis Dipstick     Status: Abnormal   Collection Time: 05/07/18  4:54 PM  Result Value Ref Range   Color, UA yellow    Clarity, UA clear    Glucose, UA Positive (A) Negative   Bilirubin, UA neg    Ketones, UA neg    Spec Grav, UA 1.020 1.010 - 1.025   Blood, UA neg    pH, UA 6.0 5.0 - 8.0   Protein, UA Negative Negative   Urobilinogen, UA     Nitrite, UA neg    Leukocytes, UA Negative Negative   Appearance     Odor    POCT Wet Prep with KOH     Status: Normal   Collection Time: 05/07/18  4:54 PM  Result Value Ref Range   Trichomonas, UA Negative    Clue Cells Wet Prep HPF POC neg    Epithelial Wet Prep HPF POC     Yeast Wet Prep HPF POC neg    Bacteria Wet Prep HPF POC     RBC Wet Prep HPF POC     WBC Wet Prep HPF POC     KOH Prep POC Negative Negative     Assessment/Plan: Acute vaginitis - Neg wet prep/pos sx and exam. Rx diflucan. F/u prn.  - Plan: fluconazole (DIFLUCAN) 150 MG tablet  Dysuria - Neg wet prep/UA. Check C&S.  Will call if pos. If neg, see if sx improve after diflucan tx. F/u prn.  - Plan: POCT Urinalysis Dipstick, POCT Wet Prep with KOH, Urine Culture  Strain of lumbar region, initial encounter - Neg CVAT. Rest/stretch/ice/heat/NSAIDs.    Meds ordered this encounter  Medications  . fluconazole (DIFLUCAN)  150 MG tablet    Sig: Take 1 tablet (150 mg total) by mouth once for 1 dose.    Dispense:  1 tablet    Refill:  0    Order Specific Question:   Supervising Provider    Answer:   Nadara Mustard [[id number]]      Return if symptoms worsen or fail to improve.  Virginia Peterson B. Denim Start, PA-C 05/07/2018 4:56 PM

## 2018-05-10 LAB — URINE CULTURE: Organism ID, Bacteria: NO GROWTH

## 2018-07-30 ENCOUNTER — Telehealth: Payer: Self-pay | Admitting: Pharmacy Technician

## 2018-07-30 NOTE — Telephone Encounter (Signed)
Received 2020 proof of income.  Patient eligible to receive medication assistance at [facility] as long as eligibility requirements continue to be met.  [facility]

## 2018-10-15 ENCOUNTER — Other Ambulatory Visit: Payer: Self-pay | Admitting: Student

## 2018-10-15 DIAGNOSIS — G8929 Other chronic pain: Secondary | ICD-10-CM

## 2018-10-31 ENCOUNTER — Ambulatory Visit
Admission: RE | Admit: 2018-10-31 | Discharge: 2018-10-31 | Disposition: A | Discharge status: Home / Self Care | Payer: Medicaid Other | Source: Ambulatory Visit | Attending: Student | Admitting: Student

## 2018-10-31 ENCOUNTER — Other Ambulatory Visit: Payer: Self-pay

## 2018-10-31 DIAGNOSIS — G8929 Other chronic pain: Secondary | ICD-10-CM | POA: Insufficient documentation

## 2018-10-31 DIAGNOSIS — M545 Low back pain: Secondary | ICD-10-CM | POA: Diagnosis present

## 2018-11-05 ENCOUNTER — Ambulatory Visit: Payer: Self-pay

## 2019-01-08 NOTE — Addendum Note (Signed)
Encounter addended by: Pricilla Larsson, [facility] on: 01/08/2019 10:28 AM  Actions taken: Order Reconciliation Section accessed

## 2019-02-02 ENCOUNTER — Telehealth: Payer: Self-pay | Admitting: Pharmacy Technician

## 2019-02-02 ENCOUNTER — Encounter: Payer: Self-pay | Admitting: Pharmacist

## 2019-02-02 ENCOUNTER — Ambulatory Visit: Payer: Self-pay | Admitting: Pharmacist

## 2019-02-02 ENCOUNTER — Other Ambulatory Visit: Payer: Self-pay

## 2019-02-02 NOTE — Telephone Encounter (Signed)
Provided patient with information about [facility]'s [facility] Program.  [name] J. [name] Care Manager [facility] 

## 2019-02-02 NOTE — Progress Notes (Cosign Needed)
[facility] Visit Note  Patient: Virginia Peterson MRN: [id number] Date of Birth: 06-Jan-1976 PCP: Marguarite ArbourSparks, Jeffrey D, MD   Virginia Peterson 43 y.o. female was contacted via telephone for MTM. Patient was identified with DOB and address.   LMP 06/25/2017   Patient Information   Past Medical History:  Diagnosis Date  . Allergy   . Anxiety   . Asthma    WELL CONTROLLED  . Depressed   . Diabetes mellitus without complication (HCC)   . Dysthymia 2016  . GERD (gastroesophageal reflux disease)   . Hyperlipidemia   . Hypertension       Past Surgical History:  Procedure Laterality Date  . ABDOMINAL HYSTERECTOMY     partial (fallopian adn uterus removed)  . benign neck tumor  2012  . CESAREAN SECTION    . CYSTOSCOPY N/A 07/04/2017   Procedure: CYSTOSCOPY;  Surgeon: Nadara MustardHarris, Robert P, MD;  Location: [facility] ORS;  Service: Gynecology;  Laterality: N/A;  . ESOPHAGOGASTRODUODENOSCOPY (EGD) WITH PROPOFOL N/A 10/12/2016   Procedure: ESOPHAGOGASTRODUODENOSCOPY (EGD) WITH PROPOFOL;  Surgeon: Wyline MoodAnna, Kiran, MD;  [facility];  Service: Endoscopy;  Laterality: N/A;  . LAPAROSCOPIC HYSTERECTOMY Bilateral 07/04/2017   Procedure: HYSTERECTOMY TOTAL LAPAROSCOPIC BILATERAL SALPINGECTOMY;  Surgeon: Nadara MustardHarris, Robert P, MD;  Location: [facility] ORS;  Service: Gynecology;  Laterality: Bilateral;     Family History  Problem Relation Age of Onset  . Diabetes Mother   . Hyperlipidemia Mother   . Hypertension Mother   . Depression Mother   . Cancer Mother        Low grade, ovarian- remission  . Diabetes Maternal Grandfather   . Kidney disease Brother   . Diabetes Maternal Grandmother     New Diagnoses (since last visit):   Family Support: Good  Lifestyle Diet: Breakfast: Rye toast, egg whites, or oatmeal.  Lunch: skips. Will eat something sweet if sugar drops. On adderall, so appetite is suppressed. Dinner: meat, vegetables, and carb Drinks: crystal light and water. Recently stopped  drinking soda.      Exercise: walks about 3 times a week       Social History   Substance and Sexual Activity  Alcohol Use No      Social History   Tobacco Use  Smoking Status Former Smoker  . Years: 4.00  . Types: Cigarettes  . Quit date: 06/28/2004  . Years since quitting: 14.6  Smokeless Tobacco Never Used  Tobacco Comment   1 CIG DAILY      Health Maintenance  Topic Date Due  . HEMOGLOBIN A1C  004[birth year]Aug-1977  . PNEUMOCOCCAL POLYSACCHARIDE VACCINE AGE 29-64 HIGH RISK  02/16/1978  . FOOT EXAM  02/16/1986  . OPHTHALMOLOGY EXAM  02/16/1986  . URINE MICROALBUMIN  02/16/1986  . HIV Screening  02/17/1991  . TETANUS/TDAP  02/17/1995  . INFLUENZA VACCINE  01/03/2019  . PAP SMEAR-Modifier  03/08/2020   Outpatient Encounter Medications as of 02/02/2019  Medication Sig  . acetaminophen (TYLENOL) 500 MG tablet Take 500 mg by mouth as needed for headache.  . albuterol (PROVENTIL HFA;VENTOLIN HFA) 108 (90 BASE) MCG/ACT inhaler Inhale 2 puffs into the lungs every 6 (six) hours as needed for wheezing or shortness of breath.  . ALPRAZolam (XANAX) 1 MG tablet Take by mouth as needed for anxiety.   Marland Kitchen. amphetamine-dextroamphetamine (ADDERALL) 10 MG tablet Take 10 mg by mouth 2 (two) times daily with a meal.  . atorvastatin (LIPITOR) 20 MG tablet Take 20 mg by mouth at bedtime.   Marland Kitchen. azelastine (  ASTELIN) 0.1 % nasal spray Place 1 spray into both nostrils 2 (two) times daily. Use in each nostril as directed  . buPROPion (WELLBUTRIN XL) 150 MG 24 hr tablet Take 150 mg by mouth daily.  . Fluticasone-Salmeterol (ADVAIR) 250-50 MCG/DOSE AEPB Inhale into the lungs.  Marland Kitchen glipiZIDE (GLUCOTROL) 10 MG tablet Take 1 tablet (10 mg total) by mouth 2 (two) times daily. (Patient taking differently: Take 5 mg by mouth every morning. )  . lisinopril (PRINIVIL,ZESTRIL) 10 MG tablet Take 1 tablet (10 mg total) by mouth daily. (Patient taking differently: Take 10 mg by mouth 2 (two) times daily. )  . loperamide  (IMODIUM A-D) 2 MG tablet Take 1 tablet (2 mg total) by mouth 4 (four) times daily as needed for diarrhea or loose stools.  Marland Kitchen omeprazole (PRILOSEC) 20 MG capsule Take by mouth 2 (two) times daily before a meal.   . SYNJARDY XR 12.10-998 MG TB24 Take 2 tablets by mouth every morning.   Marland Kitchen albuterol (ACCUNEB) 1.25 MG/3ML nebulizer solution Inhale 1 ampule into the lungs every 6 (six) hours as needed for wheezing or shortness of breath.   . fluticasone (FLONASE) 50 MCG/ACT nasal spray SHAKE LQ AND U 2 SPRAYS IEN QD PRF RHINITIS OR ALLERGIES  . tiZANidine (ZANAFLEX) 2 MG tablet Take 2 mg by mouth 3 (three) times daily. prn  . [DISCONTINUED] escitalopram (LEXAPRO) 20 MG tablet Take 30 mg by mouth at bedtime. Takes 1.5 tablets  . [DISCONTINUED] ondansetron (ZOFRAN ODT) 4 MG disintegrating tablet Take 1 tablet (4 mg total) by mouth every 8 (eight) hours as needed for nausea or vomiting.  . [DISCONTINUED] phentermine (ADIPEX-P) 37.5 MG tablet Take by mouth.  . [DISCONTINUED] terconazole (TERAZOL 7) 0.4 % vaginal cream Place 1 applicator vaginally at bedtime.   No facility-administered encounter medications on file as of 02/02/2019.      Assessment and Plan:  Access/adherence: Patient picks up her medications at [facility], which the exception of adderall and zanax being filled at [facility]. She is fully adherence with no reported missed dose.  Acid Reflux: patient takes omeprazole BID with no issues.  Asthma: patient is on ADVAIR and Ventolin. Patient is advised to take the [place] as directed (patient only takes it PRN daily) with no reported issues. Patient reports using ventolin ~1-2 times a week.   DM: Patient is on Synjardy and glipizide. Patient checks BG 1-2 times a day with PBG ~140-150s. Patient reports no issues. Patient is also on an ACEi and Statin  HTN: patient is on lisinopril with no issues  HLD: patient is on atorvastatin with no issues  Anxiety/Depression: patient is on sertraline and  bupropion. Patient is going through many deaths in the family and have been experiencing lots of difficulties. Patient is needing refills on sertraline and bupropion. Otherwise, no issues.    Pain: patient was on tizanidine, pt is out of refills.  PAP/Mammogram: will send out [place], PharmD Candidate [phone] [place] [facility]

## 2019-03-31 ENCOUNTER — Other Ambulatory Visit: Payer: Self-pay | Admitting: Internal Medicine

## 2019-03-31 DIAGNOSIS — R519 Headache, unspecified: Secondary | ICD-10-CM

## 2019-04-09 ENCOUNTER — Other Ambulatory Visit: Payer: Self-pay

## 2019-04-09 ENCOUNTER — Ambulatory Visit
Admission: RE | Admit: 2019-04-09 | Discharge: 2019-04-09 | Disposition: A | Discharge status: Home / Self Care | Payer: Medicaid Other | Source: Ambulatory Visit | Attending: Internal Medicine | Admitting: Internal Medicine

## 2019-04-09 DIAGNOSIS — R519 Headache, unspecified: Secondary | ICD-10-CM

## 2019-07-01 ENCOUNTER — Telehealth: Payer: Self-pay | Admitting: Pharmacy Technician

## 2019-07-01 NOTE — Telephone Encounter (Signed)
Patient has Medicaid with prescription coverage.  No longer meets [facility]'s eligibility criteria.  Patient notified.  Burman Bruington J. Caydan Mctavish Care Manager [facility] 

## 2019-08-01 ENCOUNTER — Observation Stay
Admission: EM | Admit: 2019-08-01 | Discharge: 2019-08-02 | Disposition: A | Discharge status: Home / Self Care | Payer: Medicaid Other | Attending: Family Medicine | Admitting: Family Medicine

## 2019-08-01 ENCOUNTER — Other Ambulatory Visit: Payer: Self-pay

## 2019-08-01 ENCOUNTER — Emergency Department: Payer: Medicaid Other

## 2019-08-01 DIAGNOSIS — Z7984 Long term (current) use of oral hypoglycemic drugs: Secondary | ICD-10-CM | POA: Diagnosis not present

## 2019-08-01 DIAGNOSIS — R0682 Tachypnea, not elsewhere classified: Secondary | ICD-10-CM | POA: Diagnosis not present

## 2019-08-01 DIAGNOSIS — F39 Unspecified mood [affective] disorder: Secondary | ICD-10-CM | POA: Diagnosis not present

## 2019-08-01 DIAGNOSIS — K219 Gastro-esophageal reflux disease without esophagitis: Secondary | ICD-10-CM | POA: Insufficient documentation

## 2019-08-01 DIAGNOSIS — F411 Generalized anxiety disorder: Secondary | ICD-10-CM | POA: Diagnosis not present

## 2019-08-01 DIAGNOSIS — R0789 Other chest pain: Secondary | ICD-10-CM | POA: Insufficient documentation

## 2019-08-01 DIAGNOSIS — E872 Acidosis, unspecified: Secondary | ICD-10-CM

## 2019-08-01 DIAGNOSIS — Z888 Allergy status to other drugs, medicaments and biological substances status: Secondary | ICD-10-CM | POA: Diagnosis not present

## 2019-08-01 DIAGNOSIS — Z7951 Long term (current) use of inhaled steroids: Secondary | ICD-10-CM | POA: Diagnosis not present

## 2019-08-01 DIAGNOSIS — F329 Major depressive disorder, single episode, unspecified: Secondary | ICD-10-CM | POA: Diagnosis not present

## 2019-08-01 DIAGNOSIS — R197 Diarrhea, unspecified: Secondary | ICD-10-CM | POA: Diagnosis not present

## 2019-08-01 DIAGNOSIS — E785 Hyperlipidemia, unspecified: Secondary | ICD-10-CM | POA: Diagnosis not present

## 2019-08-01 DIAGNOSIS — Z833 Family history of diabetes mellitus: Secondary | ICD-10-CM | POA: Insufficient documentation

## 2019-08-01 DIAGNOSIS — Z885 Allergy status to narcotic agent status: Secondary | ICD-10-CM | POA: Insufficient documentation

## 2019-08-01 DIAGNOSIS — Z6838 Body mass index (BMI) 38.0-38.9, adult: Secondary | ICD-10-CM | POA: Insufficient documentation

## 2019-08-01 DIAGNOSIS — E871 Hypo-osmolality and hyponatremia: Secondary | ICD-10-CM | POA: Diagnosis not present

## 2019-08-01 DIAGNOSIS — Z8349 Family history of other endocrine, nutritional and metabolic diseases: Secondary | ICD-10-CM | POA: Insufficient documentation

## 2019-08-01 DIAGNOSIS — U071 COVID-19: Secondary | ICD-10-CM | POA: Diagnosis not present

## 2019-08-01 DIAGNOSIS — Z87891 Personal history of nicotine dependence: Secondary | ICD-10-CM | POA: Diagnosis not present

## 2019-08-01 DIAGNOSIS — Z9981 Dependence on supplemental oxygen: Secondary | ICD-10-CM | POA: Insufficient documentation

## 2019-08-01 DIAGNOSIS — J45909 Unspecified asthma, uncomplicated: Secondary | ICD-10-CM | POA: Insufficient documentation

## 2019-08-01 DIAGNOSIS — E119 Type 2 diabetes mellitus without complications: Secondary | ICD-10-CM | POA: Insufficient documentation

## 2019-08-01 DIAGNOSIS — E860 Dehydration: Secondary | ICD-10-CM | POA: Diagnosis not present

## 2019-08-01 DIAGNOSIS — Z79899 Other long term (current) drug therapy: Secondary | ICD-10-CM | POA: Diagnosis not present

## 2019-08-01 DIAGNOSIS — R739 Hyperglycemia, unspecified: Secondary | ICD-10-CM

## 2019-08-01 DIAGNOSIS — Z8249 Family history of ischemic heart disease and other diseases of the circulatory system: Secondary | ICD-10-CM | POA: Insufficient documentation

## 2019-08-01 DIAGNOSIS — I10 Essential (primary) hypertension: Secondary | ICD-10-CM | POA: Insufficient documentation

## 2019-08-01 LAB — COMPREHENSIVE METABOLIC PANEL WITH GFR
ALT: 14 U/L (ref 0–44)
AST: 28 U/L (ref 15–41)
Albumin: 4.4 g/dL (ref 3.5–5.0)
Alkaline Phosphatase: 51 U/L (ref 38–126)
Anion gap: 16 — ABNORMAL HIGH (ref 5–15)
BUN: 22 mg/dL — ABNORMAL HIGH (ref 6–20)
CO2: 16 mmol/L — ABNORMAL LOW (ref 22–32)
Calcium: 9.7 mg/dL (ref 8.9–10.3)
Chloride: 97 mmol/L — ABNORMAL LOW (ref 98–111)
Creatinine, Ser: 1.05 mg/dL — ABNORMAL HIGH (ref 0.44–1.00)
GFR calc Af Amer: >60 mL/min
GFR calc non Af Amer: >60 mL/min
Glucose, Bld: 334 mg/dL — ABNORMAL HIGH (ref 70–99)
Potassium: 4.8 mmol/L (ref 3.5–5.1)
Sodium: 129 mmol/L — ABNORMAL LOW (ref 135–145)
Total Bilirubin: 0.9 mg/dL (ref 0.3–1.2)
Total Protein: 8.9 g/dL — ABNORMAL HIGH (ref 6.5–8.1)

## 2019-08-01 LAB — LACTIC ACID, PLASMA
Lactic Acid, Venous: 5.4 mmol/L (ref 0.5–1.9)
Lactic Acid, Venous: 2.9 mmol/L (ref 0.5–1.9)

## 2019-08-01 LAB — PROCALCITONIN: Procalcitonin: <0.1 ng/mL

## 2019-08-01 LAB — CBC WITH DIFFERENTIAL/PLATELET
Abs Immature Granulocytes: 0.24 10*3/uL — ABNORMAL HIGH (ref 0.00–0.07)
Basophils Absolute: 0 10*3/uL (ref 0.0–0.1)
Basophils Relative: 0 %
Eosinophils Absolute: 0 10*3/uL (ref 0.0–0.5)
Eosinophils Relative: 0 %
HCT: 43.1 % (ref 36.0–46.0)
Hemoglobin: 13.9 g/dL (ref 12.0–15.0)
Immature Granulocytes: 2 %
Lymphocytes Relative: 7 %
Lymphs Abs: 1.1 10*3/uL (ref 0.7–4.0)
MCH: 26.3 pg (ref 26.0–34.0)
MCHC: 32.3 g/dL (ref 30.0–36.0)
MCV: 81.6 fL (ref 80.0–100.0)
Monocytes Absolute: 0.2 10*3/uL (ref 0.1–1.0)
Monocytes Relative: 1 %
Neutro Abs: 14.5 10*3/uL — ABNORMAL HIGH (ref 1.7–7.7)
Neutrophils Relative %: 90 %
Platelets: 523 10*3/uL — ABNORMAL HIGH (ref 150–400)
RBC: 5.28 MIL/uL — ABNORMAL HIGH (ref 3.87–5.11)
RDW: 16.1 % — ABNORMAL HIGH (ref 11.5–15.5)
WBC: 16 10*3/uL — ABNORMAL HIGH (ref 4.0–10.5)
nRBC: 0 % (ref 0.0–0.2)

## 2019-08-01 LAB — URINALYSIS, COMPLETE (UACMP) WITH MICROSCOPIC
Bacteria, UA: NONE SEEN
Bilirubin Urine: NEGATIVE
Glucose, UA: >=500 mg/dL — AB
Hgb urine dipstick: NEGATIVE
Ketones, ur: 20 mg/dL — AB
Leukocytes,Ua: NEGATIVE
Nitrite: NEGATIVE
Protein, ur: 30 mg/dL — AB
Specific Gravity, Urine: 1.03 (ref 1.005–1.030)
pH: 5 (ref 5.0–8.0)

## 2019-08-01 LAB — TROPONIN I (HIGH SENSITIVITY)
Troponin I (High Sensitivity): 2 ng/L (ref <18)
Troponin I (High Sensitivity): 3 ng/L (ref <18)

## 2019-08-01 LAB — PROTIME-INR
INR: 0.9 (ref 0.8–1.2)
Prothrombin Time: 12.5 s (ref 11.4–15.2)

## 2019-08-01 MED ORDER — SODIUM CHLORIDE 0.9% FLUSH: 3.0000 mL | Freq: Two times a day (BID) | INTRAVENOUS | Status: DC

## 2019-08-01 MED ORDER — FLUTICASONE PROPIONATE 50 MCG/ACT NA SUSP
1.0000 | Freq: Every day | NASAL | Status: DC
  Filled 2019-08-01: qty 16

## 2019-08-01 MED ORDER — ACETAMINOPHEN 325 MG PO TABS: 650.0000 mg | ORAL_TABLET | Freq: Four times a day (QID) | ORAL | Status: DC | PRN

## 2019-08-01 MED ORDER — SODIUM CHLORIDE 0.9 % IV BOLUS
1000.0000 mL | Freq: Once | INTRAVENOUS | Status: AC
  Administered 2019-08-01: 1000 mL via INTRAVENOUS

## 2019-08-01 MED ORDER — PANTOPRAZOLE SODIUM 40 MG IV SOLR
40.0000 mg | Freq: Two times a day (BID) | INTRAVENOUS | Status: DC
  Administered 2019-08-01: 40 mg via INTRAVENOUS
  Filled 2019-08-01: qty 40

## 2019-08-01 MED ORDER — ALBUTEROL SULFATE HFA 108 (90 BASE) MCG/ACT IN AERS
2.0000 | INHALATION_SPRAY | Freq: Four times a day (QID) | RESPIRATORY_TRACT | Status: DC | PRN
  Administered 2019-08-02: 10:00:00 2 via RESPIRATORY_TRACT
  Filled 2019-08-01: qty 6.7

## 2019-08-01 MED ORDER — BUPROPION HCL ER (XL) 150 MG PO TB24
150.0000 mg | ORAL_TABLET | Freq: Every day | ORAL | Status: DC
  Administered 2019-08-01 – 2019-08-02 (×2): 150 mg via ORAL
  Filled 2019-08-01 (×2): qty 1

## 2019-08-01 MED ORDER — MOMETASONE FURO-FORMOTEROL FUM 200-5 MCG/ACT IN AERO
2.0000 | INHALATION_SPRAY | Freq: Two times a day (BID) | RESPIRATORY_TRACT | Status: DC
  Administered 2019-08-02: 2 via RESPIRATORY_TRACT
  Filled 2019-08-01: qty 8.8

## 2019-08-01 MED ORDER — ONDANSETRON HCL 4 MG PO TABS: 4.0000 mg | ORAL_TABLET | Freq: Four times a day (QID) | ORAL | Status: DC | PRN

## 2019-08-01 MED ORDER — ATORVASTATIN CALCIUM 20 MG PO TABS
20.0000 mg | ORAL_TABLET | Freq: Every day | ORAL | Status: DC
  Administered 2019-08-01: 20 mg via ORAL
  Filled 2019-08-01: qty 1

## 2019-08-01 MED ORDER — HYDROCODONE-ACETAMINOPHEN 5-325 MG PO TABS
2.0000 | ORAL_TABLET | Freq: Once | ORAL | Status: AC
  Administered 2019-08-01: 2 via ORAL
  Filled 2019-08-01: qty 2

## 2019-08-01 MED ORDER — ONDANSETRON HCL 4 MG/2ML IJ SOLN: 4.0000 mg | Freq: Four times a day (QID) | INTRAMUSCULAR | Status: DC | PRN

## 2019-08-01 MED ORDER — ALPRAZOLAM 1 MG PO TABS
1.0000 mg | ORAL_TABLET | ORAL | Status: DC | PRN
  Administered 2019-08-01: 1 mg via ORAL
  Filled 2019-08-01: qty 2

## 2019-08-01 MED ORDER — SODIUM CHLORIDE 0.9% FLUSH: 3.0000 mL | INTRAVENOUS | Status: DC | PRN

## 2019-08-01 MED ORDER — HEPARIN SODIUM (PORCINE) 5000 UNIT/ML IJ SOLN
5000.0000 [IU] | Freq: Three times a day (TID) | INTRAMUSCULAR | Status: DC
  Administered 2019-08-01 – 2019-08-02 (×2): 5000 [IU] via SUBCUTANEOUS
  Filled 2019-08-01 (×2): qty 1

## 2019-08-01 MED ORDER — INSULIN ASPART 100 UNIT/ML ~~LOC~~ SOLN: 0.0000 [IU] | Freq: Three times a day (TID) | SUBCUTANEOUS | Status: DC

## 2019-08-01 MED ORDER — LISINOPRIL 10 MG PO TABS
10.0000 mg | ORAL_TABLET | Freq: Every day | ORAL | Status: DC
  Administered 2019-08-01 – 2019-08-02 (×2): 10 mg via ORAL
  Filled 2019-08-01 (×2): qty 1

## 2019-08-01 MED ORDER — ALBUTEROL SULFATE (2.5 MG/3ML) 0.083% IN NEBU: 2.5000 mg | INHALATION_SOLUTION | Freq: Four times a day (QID) | RESPIRATORY_TRACT | Status: DC | PRN

## 2019-08-01 MED ORDER — ACETAMINOPHEN 650 MG RE SUPP: 650.0000 mg | Freq: Four times a day (QID) | RECTAL | Status: DC | PRN

## 2019-08-01 MED ORDER — LACTATED RINGERS IV SOLN: INTRAVENOUS | Status: DC

## 2019-08-01 MED ORDER — AMPHETAMINE-DEXTROAMPHETAMINE 10 MG PO TABS: 10.0000 mg | ORAL_TABLET | Freq: Two times a day (BID) | ORAL | Status: DC

## 2019-08-01 MED ORDER — SODIUM CHLORIDE 0.9 % IV SOLN: 250.0000 mL | INTRAVENOUS | Status: DC | PRN

## 2019-08-01 NOTE — Plan of Care (Signed)
It was communicated to the ED nurse that 2nd lactic acid result was needed per sepsis protocol.

## 2019-08-01 NOTE — Assessment & Plan Note (Signed)
Home regimen with lisinopril continued at 10 mg. Patient's creatinine is mildly elevated at 1.05, ACE inhibitor dose is decreased to 10 mg from the 20 mg. We will follow patient's kidney function. We will renally dose needed medications and avoid nephrotoxic agents.

## 2019-08-01 NOTE — ED Notes (Signed)
The following lab specimens were sent to the lab for further analysis   1 grey top / 1 lav/ 1 light green/ 2 tiger tops

## 2019-08-01 NOTE — ED Triage Notes (Signed)
Patient arrived via EMS from home, Patient is AOx4 and ambulatory. Patient called 911 due to having COVID symptoms which began yesterday when patient also resulted Positive for COVID. Patient has had multiple episode of diarrhea, 1x episode of emesis, A-febrile. Patient does have a dry cough.   Patient does have Asthma and is Type 2 Diabetes.

## 2019-08-01 NOTE — Assessment & Plan Note (Signed)
covid-19 isolation per protocol. Supportive care with ivf as needed.

## 2019-08-01 NOTE — ED Provider Notes (Signed)
[facility] Emergency Department Provider Note  ____________________________________________  Time seen: Approximately 7:05 PM  I have reviewed the triage vital signs and the nursing notes.   HISTORY  Chief Complaint COVID + w/ Symptoms (Pt is a-febrile), Shortness of Breath, and Diarrhea    HPI Virginia Peterson is a 44 y.o. female who presents the emergency department complaining of generalized malaise, chest pressure, shortness of breath, coughing, abdominal cramping, nausea, diarrhea, frequent urination.  Patient states that she developed viral illness symptoms a few days ago, was tested and received a positive Covid test yesterday.  Patient states that her symptoms have drastically worsened.  She states that she is primarily concerned as she feels very weak and "off like something is wrong."  Patient denies any headache, vision changes, substernal chest pain, hematemesis or hematochezia.  Patient has a history of asthma, diabetes, GERD, hypertension, general anxiety.  Patient states that generally her blood sugars have been well maintained, however her last A1c was in the nines.  Patient states that she has had significantly decreased appetite, has had nausea and emesis with eating but is able to drink appropriately.         Past Medical History:  Diagnosis Date  . Allergy   . Anxiety   . Asthma    WELL CONTROLLED  . Depressed   . Diabetes mellitus without complication (HCC)   . Dysthymia 2016  . GERD (gastroesophageal reflux disease)   . Hyperlipidemia   . Hypertension     Patient Active Problem List   Diagnosis Date Noted  . Metabolic acidosis 08/01/2019  . COVID-19 virus infection 08/01/2019  . Pelvic pain in female 06/28/2017  . Morbid obesity with BMI of 40.0-44.9, adult (HCC) 10/03/2016  . Asthma without status asthmaticus 07/20/2016  . Diabetes mellitus type 2, uncomplicated (HCC) 07/20/2016  . Disc disease, degenerative, lumbar or lumbosacral  07/20/2016  . Generalized anxiety disorder 07/20/2016  . Hyperlipidemia, unspecified 07/20/2016  . Hypertension 07/20/2016  . Pain syndrome, chronic 07/20/2016  . Depression 10/06/2015  . Adjustment disorder with mixed anxiety and depressed mood 10/06/2015    Past Surgical History:  Procedure Laterality Date  . ABDOMINAL HYSTERECTOMY     partial (fallopian adn uterus removed)  . benign neck tumor  2012  . CESAREAN SECTION    . CYSTOSCOPY N/A 07/04/2017   Procedure: CYSTOSCOPY;  Surgeon: Nadara Mustard, MD;  Location: [facility] ORS;  Service: Gynecology;  Laterality: N/A;  . ESOPHAGOGASTRODUODENOSCOPY (EGD) WITH PROPOFOL N/A 10/12/2016   Procedure: ESOPHAGOGASTRODUODENOSCOPY (EGD) WITH PROPOFOL;  Surgeon: Wyline Mood, MD;  Location: [facility] ENDOSCOPY;  Service: Endoscopy;  Laterality: N/A;  . LAPAROSCOPIC HYSTERECTOMY Bilateral 07/04/2017   Procedure: HYSTERECTOMY TOTAL LAPAROSCOPIC BILATERAL SALPINGECTOMY;  Surgeon: Nadara Mustard, MD;  Location: [facility] ORS;  Service: Gynecology;  Laterality: Bilateral;    Prior to Admission medications   Medication Sig Start Date End Date Taking? Authorizing Provider  acetaminophen (TYLENOL) 500 MG tablet Take 500 mg by mouth as needed for headache.    [provider]  albuterol (ACCUNEB) 1.25 MG/3ML nebulizer solution Inhale 1 ampule into the lungs every 6 (six) hours as needed for wheezing or shortness of breath.  06/18/17 06/18/18  [provider]  albuterol (PROVENTIL HFA;VENTOLIN HFA) 108 (90 BASE) MCG/ACT inhaler Inhale 2 puffs into the lungs every 6 (six) hours as needed for wheezing or shortness of breath. 04/25/15   Tommi Rumps, PA-C  ALPRAZolam Prudy Feeler) 1 MG tablet Take by mouth as needed for anxiety.  [provider]  amphetamine-dextroamphetamine (ADDERALL) 10 MG tablet Take 10 mg by mouth 2 (two) times daily with a meal.    [provider]  atorvastatin (LIPITOR) 20 MG tablet Take 20 mg by mouth at bedtime.   09/28/16 02/02/19  [provider]  azelastine (ASTELIN) 0.1 % nasal spray Place 1 spray into both nostrils 2 (two) times daily. Use in each nostril as directed    [provider]  buPROPion (WELLBUTRIN XL) 150 MG 24 hr tablet Take 150 mg by mouth daily.    [provider]  fluticasone (FLONASE) 50 MCG/ACT nasal spray SHAKE LQ AND U 2 SPRAYS IEN QD PRF RHINITIS OR ALLERGIES 08/14/16   [provider]  Fluticasone-Salmeterol (ADVAIR) 250-50 MCG/DOSE AEPB Inhale into the lungs. 12/13/17 02/02/19  [provider]  glipiZIDE (GLUCOTROL) 10 MG tablet Take 1 tablet (10 mg total) by mouth 2 (two) times daily. Patient taking differently: Take 5 mg by mouth every morning.  06/13/16 02/02/19  Jene Every, MD  lisinopril (PRINIVIL,ZESTRIL) 10 MG tablet Take 1 tablet (10 mg total) by mouth daily. Patient taking differently: Take 10 mg by mouth 2 (two) times daily.  06/13/16 02/02/19  Jene Every, MD  loperamide (IMODIUM A-D) 2 MG tablet Take 1 tablet (2 mg total) by mouth 4 (four) times daily as needed for diarrhea or loose stools. 02/22/17   Rockne Menghini, MD  omeprazole (PRILOSEC) 20 MG capsule Take by mouth 2 (two) times daily before a meal.  09/03/17 02/02/19  [provider]  SYNJARDY XR 12.10-998 MG TB24 Take 2 tablets by mouth every morning.  10/10/16   [provider]  tiZANidine (ZANAFLEX) 2 MG tablet Take 2 mg by mouth 3 (three) times daily. prn    [provider]    Allergies Tramadol and Tegaderm ag mesh [silver]  Family History  Problem Relation Age of Onset  . Diabetes Mother   . Hyperlipidemia Mother   . Hypertension Mother   . Depression Mother   . Cancer Mother        Low grade, ovarian- remission  . Diabetes Maternal Grandfather   . Kidney disease Brother   . Diabetes Maternal Grandmother     Social History Social History   Tobacco Use  . Smoking status: Former Smoker    Years: 4.00    Types: Cigarettes     Quit date: 06/28/2004    Years since quitting: 15.1  . Smokeless tobacco: Never Used  . Tobacco comment: 1 CIG DAILY  Substance Use Topics  . Alcohol use: No  . Drug use: No     Review of Systems  Constitutional: No fever/chills Eyes: No visual changes. No discharge ENT: No upper respiratory complaints. Cardiovascular: no substernal chest pain.  Positive for chest tightness. Respiratory: Positive cough.  Positive SOB. Gastrointestinal: No abdominal pain.  Nausea with one episode of emesis.Marland Kitchen  Positive diarrhea.  No constipation. Genitourinary: Positive for dysuria. No hematuria Musculoskeletal: Negative for musculoskeletal pain. Skin: Negative for rash, abrasions, lacerations, ecchymosis. Neurological: Negative for headaches, focal weakness or numbness. 10-point ROS otherwise negative.  ____________________________________________   PHYSICAL EXAM:  VITAL SIGNS: ED Triage Vitals  Enc Vitals Group     BP 08/01/19 1700 (!) 158/80     Pulse Rate 08/01/19 1700 (!) 109     Resp 08/01/19 1700 (!) 24     Temp 08/01/19 1700 98 F (36.7 C)     Temp Source 08/01/19 1700 Oral     SpO2 08/01/19  1700 97 %     Weight 08/01/19 1715 222 lb 10.6 oz (101 kg)     Height 08/01/19 1715 5\' 4"  (1.626 m)     Head Circumference --      Peak Flow --      Pain Score 08/01/19 1715 4     Pain Loc --      Pain Edu? --      Excl. in [place]? --      Constitutional: Alert and oriented. Well appearing and in no acute distress. Eyes: Conjunctivae are normal. PERRL. EOMI. Head: Atraumatic. ENT:      Ears:       Nose: No congestion/rhinnorhea.      Mouth/Throat: Mucous membranes are moist.  Neck: No stridor.  Neck is supple full range of motion Hematological/Lymphatic/Immunilogical: No cervical lymphadenopathy. Cardiovascular: Normal rate, regular rhythm. Normal S1 and S2.  Good peripheral circulation. Respiratory: Normal respiratory effort without tachypnea or retractions. Lungs CTAB. Good air  entry to the bases with no decreased or absent breath sounds. Gastrointestinal: Bowel sounds 4 quadrants.  Soft to palpation all quadrants.  Mild diffuse tenderness throughout the abdomen.  No point specific tenderness.  No rebound tenderness.. No guarding or rigidity. No palpable masses. No distention. No CVA tenderness. Musculoskeletal: Full range of motion to all extremities. No gross deformities appreciated. Neurologic:  Normal speech and language. No gross focal neurologic deficits are appreciated.  Skin:  Skin is warm, dry and intact. No rash noted. Psychiatric: Mood and affect are normal. Speech and behavior are normal. Patient exhibits appropriate insight and judgement.   ____________________________________________   LABS (all labs ordered are listed, but only abnormal results are displayed)  Labs Reviewed  COMPREHENSIVE METABOLIC PANEL - Abnormal; Notable for the following components:      Result Value   Sodium 129 (*)    Chloride 97 (*)    CO2 16 (*)    Glucose, Bld 334 (*)    BUN 22 (*)    Creatinine, Ser 1.05 (*)    Total Protein 8.9 (*)    Anion gap 16 (*)    All other components within normal limits  LACTIC ACID, PLASMA - Abnormal; Notable for the following components:   Lactic Acid, Venous 5.4 (*)    All other components within normal limits  CBC WITH DIFFERENTIAL/PLATELET - Abnormal; Notable for the following components:   WBC 16.0 (*)    RBC 5.28 (*)    RDW 16.1 (*)    Platelets 523 (*)    Neutro Abs 14.5 (*)    Abs Immature Granulocytes 0.24 (*)    All other components within normal limits  PROTIME-INR  PROCALCITONIN  URINALYSIS, COMPLETE (UACMP) WITH MICROSCOPIC  TROPONIN I (HIGH SENSITIVITY)  TROPONIN I (HIGH SENSITIVITY)   ____________________________________________  EKG  ED ECG REPORT I, Charline Bills Dontea Corlew,  personally viewed and interpreted this ECG.   Date: 08/01/2019  EKG Time: 1847 hrs.  Rate: 95 bpm  Rhythm: normal sinus rhythm,  PAC's noted  Axis: normal axis  Intervals:none  ST&T Change: No ST elevation or depression noted.  Normal sinus rhythm. No STEMI  ____________________________________________  RADIOLOGY I personally viewed and evaluated these images as part of my medical decision making, as well as reviewing the written report by the radiologist.  DG Chest 1 View  Result Date: 08/01/2019 CLINICAL DATA:  44 year old female with cough.  Positive COVID-19. EXAM: CHEST  1 VIEW COMPARISON:  Chest radiograph dated 03/03/2018. FINDINGS: The heart size and  mediastinal contours are within normal limits. Both lungs are clear. The visualized skeletal structures are unremarkable. IMPRESSION: No active disease. Electronically Signed   By: Elgie Collard M.D.   On: 08/01/2019 18:52    ____________________________________________    PROCEDURES  Procedure(s) performed:    Procedures    Medications  atorvastatin (LIPITOR) tablet 20 mg (has no administration in time range)  lisinopril (ZESTRIL) tablet 10 mg (has no administration in time range)  ALPRAZolam (XANAX) tablet 1 mg (has no administration in time range)  amphetamine-dextroamphetamine (ADDERALL) tablet 10 mg (has no administration in time range)  buPROPion (WELLBUTRIN XL) 24 hr tablet 150 mg (has no administration in time range)  albuterol (ACCUNEB) nebulizer solution 1.25 mg (has no administration in time range)  albuterol (VENTOLIN HFA) 108 (90 Base) MCG/ACT inhaler 2 puff (has no administration in time range)  fluticasone (FLONASE) 50 MCG/ACT nasal spray 1 spray (has no administration in time range)  mometasone-formoterol (DULERA) 200-5 MCG/ACT inhaler 2 puff (has no administration in time range)  sodium chloride 0.9 % bolus 1,000 mL (1,000 mLs Intravenous New Bag/Given 08/01/19 1838)     ____________________________________________   INITIAL IMPRESSION / ASSESSMENT AND PLAN / ED COURSE  Pertinent labs & imaging results that were available  during my care of the patient were reviewed by me and considered in my medical decision making (see chart for details).  Review of the [place] CSRS was performed in accordance of the [facility] prior to dispensing any controlled drugs.           Patient's diagnosis is consistent with COVID-19 with hyperglycemia and metabolic acidosis.  Patient presented to the emergency department as a known Covid positive patient.  Patient presented with weakness, emesis and diarrhea.  Patient was more concerned about other symptoms than any respiratory complaints.  Overall exam was reassuring.  Reassuring lung sounds.  Patient was tender to palpation in the intercostal margins consistent with costochondritis.  Labs are concerning for dehydration versus sepsis versus DKA.  There does appear to be a metabolic acidosis component.  I do not feel at this time that patient is likely in true DKA even though she has an elevated anion gap and elevated blood sugar.  Patient has been on steroids, as well as her ongoing illness.  Between stress hyperglycemia, elevation of blood sugar was steroid, I feel that this is not likely DKA.  As such I will not place the patient on an insulin drip at this time.  At this time fluids, supportive care and reevaluate.  Given multiple complaints, multiple lab findings it was felt the patient would likely worsen at home even with prescribed medications for symptom relief.  I feel that the patient would be best suited with inpatient admission for management.  Patient will be admitted to the hospital service..     ____________________________________________  FINAL CLINICAL IMPRESSION(S) / ED DIAGNOSES  Final diagnoses:  COVID-19  Hyperglycemia  Metabolic acidosis      NEW MEDICATIONS STARTED DURING THIS VISIT:  ED Discharge Orders    None          This chart was dictated using voice recognition software/Dragon. Despite best efforts to proofread, errors can occur which can change the  meaning. Any change was purely unintentional.    Lanette Hampshire 08/01/19 2053    Shaune Pollack, MD 08/04/19 1507    Shaune Pollack, MD 08/04/19 [phone]

## 2019-08-01 NOTE — ED Notes (Signed)
Attempted to call report. Floor unable to accept at this time. Will re-attempt in 

## 2019-08-01 NOTE — H&P (Signed)
History and Physical    Virginia Peterson YPP:[id number] DOB: [birth year] DOA: 08/01/2019   PCP: Idelle Crouch, MD   Patient coming from: home  Chief Complaint: Diarrhea x one day.  HPI: Virginia Peterson is a 44 y.o. female with medical history significant of DM/HTN seen in ed for diarrhea that started a day ago. Patient states that she developed viral illness symptoms a few days ago, was tested and received a positive Covid test yesterday.  Patient states that her symptoms have drastically worsened.  She states that she is primarily concerned as she feels very weak and "off like something is wrong."  Patient denies any headache, vision changes, substernal chest pain, hematemesis or hematochezia.  Patient has a history of asthma, diabetes, GERD, hypertension, general anxiety.     ED Course:  Blood pressure (!) 153/85, pulse (!) 101, temperature 98 F (36.7 C), temperature source Oral, resp. rate 16, height 5\' 4"  (1.626 m), weight 101 kg, last menstrual period 06/25/2017, SpO2 96 %.  Pt was alert and gave history. She has got the infection from the children in her home possibly her grandchildren.   Review of Systems: As per HPI otherwise 10 point review of systems negative.   Past Medical History:  Diagnosis Date  . Allergy   . Anxiety   . Asthma    WELL CONTROLLED  . Depressed   . Diabetes mellitus without complication ([place])   . Dysthymia 2016  . GERD (gastroesophageal reflux disease)   . Hyperlipidemia   . Hypertension     Past Surgical History:  Procedure Laterality Date  . ABDOMINAL HYSTERECTOMY     partial (fallopian adn uterus removed)  . benign neck tumor  2012  . CESAREAN SECTION    . CYSTOSCOPY N/A 07/04/2017   Procedure: CYSTOSCOPY;  Surgeon: Gae Dry, MD;  Location: [facility] ORS;  Service: Gynecology;  Laterality: N/A;  . ESOPHAGOGASTRODUODENOSCOPY (EGD) WITH PROPOFOL N/A 10/12/2016   Procedure: ESOPHAGOGASTRODUODENOSCOPY (EGD) WITH PROPOFOL;  Surgeon: Jonathon Bellows,  MD;  Location: [facility];  Service: Endoscopy;  Laterality: N/A;  . LAPAROSCOPIC HYSTERECTOMY Bilateral 07/04/2017   Procedure: HYSTERECTOMY TOTAL LAPAROSCOPIC BILATERAL SALPINGECTOMY;  Surgeon: Gae Dry, MD;  Location: [facility] ORS;  Service: Gynecology;  Laterality: Bilateral;     reports that she quit smoking about 15 years ago. Her smoking use included cigarettes. She quit after 4.00 years of use. She has never used smokeless tobacco. She reports that she does not drink alcohol or use drugs.  Allergies  Allergen Reactions  . Tramadol Itching  . Tegaderm Ag Mesh [Silver] Rash    Family History  Problem Relation Age of Onset  . Diabetes Mother   . Hyperlipidemia Mother   . Hypertension Mother   . Depression Mother   . Cancer Mother        Low grade, ovarian- remission  . Diabetes Maternal Grandfather   . Kidney disease Brother   . Diabetes Maternal Grandmother     Prior to Admission medications   Medication Sig Start Date End Date Taking? Authorizing Provider  acetaminophen (TYLENOL) 500 MG tablet Take 500 mg by mouth as needed for headache.    [provider]  albuterol (ACCUNEB) 1.25 MG/3ML nebulizer solution Inhale 1 ampule into the lungs every 6 (six) hours as needed for wheezing or shortness of breath.  06/18/17 06/18/18  [provider]  albuterol (PROVENTIL HFA;VENTOLIN HFA) 108 (90 BASE) MCG/ACT inhaler Inhale 2 puffs into the lungs every 6 (six) hours as needed  for wheezing or shortness of breath. 04/25/15   Tommi Rumps, PA-C  ALPRAZolam Prudy Feeler) 1 MG tablet Take by mouth as needed for anxiety.     [provider]  amphetamine-dextroamphetamine (ADDERALL) 10 MG tablet Take 10 mg by mouth 2 (two) times daily with a meal.    [provider]  atorvastatin (LIPITOR) 20 MG tablet Take 20 mg by mouth at bedtime.  09/28/16 02/02/19  [provider]  azelastine (ASTELIN) 0.1 % nasal spray Place 1 spray into both nostrils 2  (two) times daily. Use in each nostril as directed    [provider]  buPROPion (WELLBUTRIN XL) 150 MG 24 hr tablet Take 150 mg by mouth daily.    [provider]  fluticasone (FLONASE) 50 MCG/ACT nasal spray SHAKE LQ AND U 2 SPRAYS IEN QD PRF RHINITIS OR ALLERGIES 08/14/16   [provider]  Fluticasone-Salmeterol (ADVAIR) 250-50 MCG/DOSE AEPB Inhale into the lungs. 12/13/17 02/02/19  [provider]  glipiZIDE (GLUCOTROL) 10 MG tablet Take 1 tablet (10 mg total) by mouth 2 (two) times daily. Patient taking differently: Take 5 mg by mouth every morning.  06/13/16 02/02/19  Jene Every, MD  lisinopril (PRINIVIL,ZESTRIL) 10 MG tablet Take 1 tablet (10 mg total) by mouth daily. Patient taking differently: Take 10 mg by mouth 2 (two) times daily.  06/13/16 02/02/19  Jene Every, MD  loperamide (IMODIUM A-D) 2 MG tablet Take 1 tablet (2 mg total) by mouth 4 (four) times daily as needed for diarrhea or loose stools. 02/22/17   Rockne Menghini, MD  omeprazole (PRILOSEC) 20 MG capsule Take by mouth 2 (two) times daily before a meal.  09/03/17 02/02/19  [provider]  SYNJARDY XR 12.10-998 MG TB24 Take 2 tablets by mouth every morning.  10/10/16   [provider]  tiZANidine (ZANAFLEX) 2 MG tablet Take 2 mg by mouth 3 (three) times daily. prn    [provider]    Physical Exam: Vitals:   08/01/19 2000 08/01/19 2030 08/01/19 2100 08/01/19 2239  BP: (!) 157/87 (!) 176/88 (!) 170/81 (!) 153/85  Pulse: (!) 102 (!) 101 (!) 104 (!) 101  Resp: (!) 22 (!) 23 17 16   Temp:      TempSrc:      SpO2: 98% 100% 97% 96%  Weight:      Height:          Constitutional: NAD, calm, comfortable Vitals:   08/01/19 2000 08/01/19 2030 08/01/19 2100 08/01/19 2239  BP: (!) 157/87 (!) 176/88 (!) 170/81 (!) 153/85  Pulse: (!) 102 (!) 101 (!) 104 (!) 101  Resp: (!) 22 (!) 23 17 16   Temp:      TempSrc:      SpO2: 98% 100% 97% 96%  Weight:      Height:        Eyes: PERRL, lids and conjunctivae normal ENMT: Mucous membranes are moist. Posterior pharynx clear of any exudate or lesions.Normal dentition.  Neck: normal, supple, no masses, no thyromegaly Respiratory: clear to auscultation bilaterally, no wheezing, no crackles. Normal respiratory effort. No accessory muscle use.  Cardiovascular: Regular rate and rhythm, no murmurs / rubs / gallops. No extremity edema. 2+ pedal pulses. No carotid bruits.  Abdomen: no tenderness, no masses palpated. No hepatosplenomegaly. Bowel sounds positive.  Musculoskeletal: no clubbing / cyanosis. No joint deformity upper and lower extremities. Good ROM, no contractures. Normal muscle tone.  Skin: no rashes, lesions, ulcers. No induration Neurologic: CN 2-12 grossly intact.  Strength 5/5 in all 4.  Psychiatric: Normal judgment and insight. Alert and oriented x 3. Normal mood.   Labs on Admission: I have personally reviewed following labs and imaging studies  CBC: Recent Labs  Lab 08/01/19 1708  WBC 16.0*  NEUTROABS 14.5*  HGB 13.9  HCT 43.1  MCV 81.6  PLT 523*   Basic Metabolic Panel: Recent Labs  Lab 08/01/19 1708  NA 129*  K 4.8  CL 97*  CO2 16*  GLUCOSE 334*  BUN 22*  CREATININE 1.05*  CALCIUM 9.7   GFR: Estimated Creatinine Clearance: 79.8 mL/min (A) (by C-G formula based on SCr of 1.05 mg/dL (H)). Liver Function Tests: Recent Labs  Lab 08/01/19 1708  AST 28  ALT 14  ALKPHOS 51  BILITOT 0.9  PROT 8.9*  ALBUMIN 4.4   No results for input(s): LIPASE, AMYLASE in the last 168 hours. No results for input(s): AMMONIA in the last 168 hours. Coagulation Profile: Recent Labs  Lab 08/01/19 1708  INR 0.9   Cardiac Enzymes: No results for input(s): CKTOTAL, CKMB, CKMBINDEX, TROPONINI in the last 168 hours. BNP (last 3 results) No results for input(s): PROBNP in the last 8760 hours. HbA1C: No results for input(s): HGBA1C in the last 72 hours. CBG: No results for input(s): GLUCAP  in the last 168 hours. Lipid Profile: No results for input(s): CHOL, HDL, LDLCALC, TRIG, CHOLHDL, LDLDIRECT in the last 72 hours. Thyroid Function Tests: No results for input(s): TSH, T4TOTAL, FREET4, T3FREE, THYROIDAB in the last 72 hours. Anemia Panel: No results for input(s): VITAMINB12, FOLATE, FERRITIN, TIBC, IRON, RETICCTPCT in the last 72 hours. Urine analysis:    Component Value Date/Time   COLORURINE YELLOW (A) 08/01/2019 2116   APPEARANCEUR CLEAR (A) 08/01/2019 2116   LABSPEC 1.030 08/01/2019 2116   PHURINE 5.0 08/01/2019 2116   GLUCOSEU >=500 (A) 08/01/2019 2116   HGBUR NEGATIVE 08/01/2019 2116   BILIRUBINUR NEGATIVE 08/01/2019 2116   BILIRUBINUR neg 05/07/2018 1654   KETONESUR 20 (A) 08/01/2019 2116   PROTEINUR 30 (A) 08/01/2019 2116   NITRITE NEGATIVE 08/01/2019 2116   LEUKOCYTESUR NEGATIVE 08/01/2019 2116   Sepsis Labs: @LABRCNTIP (procalcitonin:4,lacticidven:4) )No results found for this or any previous visit (from the past 240 hour(s)).   Radiological Exams on Admission: DG Chest 1 View  Result Date: 08/01/2019 CLINICAL DATA:  44 year old female with cough.  Positive COVID-19. EXAM: CHEST  1 VIEW COMPARISON:  Chest radiograph dated 03/03/2018. FINDINGS: The heart size and mediastinal contours are within normal limits. Both lungs are clear. The visualized skeletal structures are unremarkable. IMPRESSION: No active disease. Electronically Signed   By: 03/05/2018 M.D.   On: 08/01/2019 18:52    EKG: Independently reviewed.  Sinus rhythm 95.  Assessment/Plan Principal Problem:   Diarrhea Active Problems:   COVID-19 virus infection   Diabetes mellitus type 2, uncomplicated (HCC)   Generalized anxiety disorder   Hypertension   Diarrhea  Assessment & Plan Attribute to covid-19 gastroenteritis. Supportive care with ivf.  IV ppi therapy for GERD. D/d also include metformin related diarrhea with metabolic acidosis due to lactic acidosis.  Covid-19  Gastroenteritis Assessment & Plan Supportive care with ivf. Antiviral regimen not indicated.  D/C once pt is stable.   Diabetes mellitus type 2, uncomplicated (HCC) Assessment & Plan A1c. SSI/ accucheck/ hypoglycemia protocol as needed.  Regimen with glipizide and insulin is held.   Hypertension Assessment & Plan Home regimen with lisinopril continued at 10 mg. Patient's creatinine is mildly elevated at 1.05, ACE inhibitor  dose is decreased to 10 mg from the 20 mg. We will follow patient's kidney function. We will renally dose needed medications and avoid nephrotoxic agents.  Generalized anxiety disorder Assessment & Plan Home medications of Wellbutrin 50 mg daily continued. Patient's alprazolam 1 mg as needed continued.   DVT prophylaxis: Heparin Code Status: Full code Family Communication: None at bedside Disposition Plan: home Consults called: None Admission status: Observation.   Gertha Calkin MD [facility] If 7PM-7AM, please contact night-coverage [email or web address] Password [facility]  08/01/2019, 11:33 PM

## 2019-08-01 NOTE — ED Notes (Signed)
This RN called the floor to attempt to have pt transferred. Accepting RN stating pt is too unstable to be accepted at this time/ ED Charge RN notified

## 2019-08-01 NOTE — ED Notes (Signed)
Pt given a beverage and a snack at this time

## 2019-08-01 NOTE — Assessment & Plan Note (Addendum)
A1c. SSI/ accucheck/ hypoglycemia protocol as needed.  Regimen with glipizide and insulin is held.

## 2019-08-01 NOTE — Assessment & Plan Note (Signed)
Home medications of Wellbutrin 50 mg daily continued. Patient's alprazolam 1 mg as needed continued.

## 2019-08-01 NOTE — Assessment & Plan Note (Signed)
Attribute to covid-19 gastroenteritis. Supportive care with ivf.  IV ppi therapy for GERD. D/d also include metformin related diarrhea with metabolic acidosis due to lactic acidosis.

## 2019-08-02 DIAGNOSIS — U071 COVID-19: Secondary | ICD-10-CM | POA: Diagnosis not present

## 2019-08-02 LAB — BASIC METABOLIC PANEL
Anion gap: 13 (ref 5–15)
BUN: 21 mg/dL — ABNORMAL HIGH (ref 6–20)
CO2: 21 mmol/L — ABNORMAL LOW (ref 22–32)
Calcium: 8.9 mg/dL (ref 8.9–10.3)
Chloride: 99 mmol/L (ref 98–111)
Creatinine, Ser: 0.82 mg/dL (ref 0.44–1.00)
GFR calc Af Amer: >60 mL/min (ref >60)
GFR calc non Af Amer: >60 mL/min (ref >60)
Glucose, Bld: 127 mg/dL — ABNORMAL HIGH (ref 70–99)
Potassium: 3.8 mmol/L (ref 3.5–5.1)
Sodium: 133 mmol/L — ABNORMAL LOW (ref 135–145)

## 2019-08-02 LAB — HEMOGLOBIN A1C
Hgb A1c MFr Bld: 8.9 % — ABNORMAL HIGH (ref 4.8–5.6)
Mean Plasma Glucose: 208.73 mg/dL

## 2019-08-02 LAB — LIPASE, BLOOD: Lipase: 23 U/L (ref 11–51)

## 2019-08-02 LAB — BETA-HYDROXYBUTYRIC ACID: Beta-Hydroxybutyric Acid: 0.16 mmol/L (ref 0.05–0.27)

## 2019-08-02 LAB — HIV ANTIBODY (ROUTINE TESTING W REFLEX): HIV Screen 4th Generation wRfx: NONREACTIVE

## 2019-08-02 LAB — GLUCOSE, CAPILLARY
Glucose-Capillary: 145 mg/dL — ABNORMAL HIGH (ref 70–99)
Glucose-Capillary: 219 mg/dL — ABNORMAL HIGH (ref 70–99)

## 2019-08-02 LAB — LACTIC ACID, PLASMA: Lactic Acid, Venous: 3.3 mmol/L (ref 0.5–1.9)

## 2019-08-02 MED ORDER — AMLODIPINE BESYLATE 5 MG PO TABS
5.0000 mg | ORAL_TABLET | Freq: Every day | ORAL | Status: DC
  Administered 2019-08-02: 5 mg via ORAL
  Filled 2019-08-02: qty 1

## 2019-08-02 MED ORDER — PANTOPRAZOLE SODIUM 40 MG PO TBEC
40.0000 mg | DELAYED_RELEASE_TABLET | Freq: Two times a day (BID) | ORAL | Status: DC
  Administered 2019-08-02: 40 mg via ORAL

## 2019-08-02 MED ORDER — INSULIN GLARGINE 100 UNIT/ML ~~LOC~~ SOLN
20.0000 [IU] | Freq: Every day | SUBCUTANEOUS | Status: DC
  Filled 2019-08-02: qty 0.2

## 2019-08-02 MED ORDER — SODIUM CHLORIDE 0.9 % IV SOLN: INTRAVENOUS | Status: DC

## 2019-08-02 MED ORDER — BLOOD GLUCOSE MONITOR KIT: PACK | 0 refills | Status: AC

## 2019-08-02 MED ORDER — INSULIN ASPART 100 UNIT/ML ~~LOC~~ SOLN: 0.0000 [IU] | Freq: Three times a day (TID) | SUBCUTANEOUS | Status: DC

## 2019-08-02 MED ORDER — INSULIN ASPART 100 UNIT/ML ~~LOC~~ SOLN: 0.0000 [IU] | Freq: Every day | SUBCUTANEOUS | Status: DC

## 2019-08-02 MED ORDER — INSULIN DETEMIR 100 UNIT/ML ~~LOC~~ SOLN
20.0000 [IU] | Freq: Every day | SUBCUTANEOUS | Status: DC
  Administered 2019-08-02: 10:00:00 20 [IU] via SUBCUTANEOUS
  Filled 2019-08-02: qty 1

## 2019-08-02 NOTE — Discharge Summary (Signed)
Physician Discharge Summary  Virginia Peterson IZT:[id number] DOB: 1976/04/29 DOA: 08/01/2019  PCP: Idelle Crouch, MD  Admit date: 08/01/2019 Discharge date: 08/02/2019  Admitted From: Home  Disposition:  Home   Recommendations for Outpatient Follow-up:  1. Follow up with PCP Dr. Doy Hutching in 1 week 2. Please obtain BMP/CBC in one week    Home Health: None  Equipment/Devices: None  Discharge Condition: Good  CODE STATUS: FULL Diet recommendation: Diabetic  Brief/Interim Summary: Virginia Peterson is a 44 y.o. F with morbid obesity, HTN, DM, and depression who presented with diarrhea.  Developed nonspecific malaise 2-3 days prior to admission, tested positive for COVID.  Was monitoring at home, then developed worsening symptoms of weakness, malaise, diarrhea.  In the ER, lactic acid >5, pulse 101, tachypneic.  CXR clear.  Normoxic.       PRINCIPAL HOSPITAL DIAGNOSIS: Metabolic acidosis from [id number]    Discharge Diagnoses:   Acute anion gap metabolic acidosis Elevated anion gap at presentation.  Patient denied metformin use.  Lactic acidosis can contribute, possibly DKA.  She was thought to be dehydrated from diarrhea.  CXR clear and UA not suggestive of infection.  Doubt bacterial infection or sepsis.   Overnight, she was given fluids, small amount of insulin.  Her acidosis resolved, BHOB was negative, her symptoms resolved and the patient was eager for discharge.    Hyponatremia Mild, Na corrected to 133, no symptoms.  From dehydration.  COVID-19 No hypoxia or pneumonia by CXR.  Needs to isolate for 10 days from diagnosis.  Instructions on home O2 monitoring given.   Return precautions given.  Hypertension Continue lisinopril. Stop amphetamines.  Diabetes Glucometer given.  Continue glipizide.  Hold metformin for now given lactic acidosis at presentation.  Mood disorder Continue bupropion  GERD Continue PPI           Discharge Instructions  Discharge  Instructions    Discharge instructions   Complete by: As directed    From Dr. Loleta Books: You were admitted for coronavirus (Also known as COVID-19) and diarrhea and dehydration.   You should purchase a pulse oximeter at your pharmacy. This is a device that you put on your finger to measure your oxygen level.  They are available at any pharmacy. Use it to check your oxygen level twice daily until you see your primary care doctor. If your oxygen level is ever LESS than 88% and doesn't get better, you should call your primary care doctor immediately.   HOW LONG TO REMAIN IN QUARANTINE: There is no absolutely correct answer to this and so our best answer is to be on the cautious side.  Based on what we know of the virus, you should isolate strictly until 10 days from your first symptoms.  Until you end your quarantine: If you have anyone in the home who has NOT had coronavirus:    -do not be in the same room with them until your self isolation is over    -if you MUST be in the same room, make sure you wear a mask and have them wear a mask and safety glasses (if available)    -clean all hard surfaces (counters, doors, tables) twice a day    -use a separate bathroom at all times    Call Dr. Doy Hutching for a follow up as soon as your isolation period is over.   In the meantime: Drink plenty of fluids (this is REALLY important) Take your glipizide Check your blood sugar twice daily If your  sugars are consistently OVER 250 mg/dL, Call Dr. Park Meo office for instructions  Avoid Synjardy until you are feeling better and your COVID is better (probably a week or two) Synjardy has metformin in it, which can cause acid buildup in the blood when you are dehydrated  Resume all your other home medicines, but probably avoid Adderall until you are better   Increase activity slowly   Complete by: As directed      Allergies as of 08/02/2019      Reactions   Tramadol Itching   Tegaderm Ag Mesh  [silver] Rash      Medication List    TAKE these medications   acetaminophen 500 MG tablet Commonly known as: TYLENOL Take 500 mg by mouth as needed for headache.   albuterol 108 (90 Base) MCG/ACT inhaler Commonly known as: VENTOLIN HFA Inhale 2 puffs into the lungs every 6 (six) hours as needed for wheezing or shortness of breath.   albuterol 1.25 MG/3ML nebulizer solution Commonly known as: ACCUNEB Inhale 1 ampule into the lungs every 6 (six) hours as needed for wheezing or shortness of breath.   ALPRAZolam 1 MG tablet Commonly known as: XANAX Take by mouth as needed for anxiety.   amphetamine-dextroamphetamine 10 MG tablet Commonly known as: ADDERALL Take 10 mg by mouth 2 (two) times daily with a meal.   atorvastatin 20 MG tablet Commonly known as: LIPITOR Take 20 mg by mouth at bedtime.   azelastine 0.1 % nasal spray Commonly known as: ASTELIN Place 1 spray into both nostrils 2 (two) times daily. Use in each nostril as directed   blood glucose meter kit and supplies Kit Dispense based on patient and insurance preference. Use once daily. (FOR ICD-9 250.00, 250.01).   buPROPion 150 MG 24 hr tablet Commonly known as: WELLBUTRIN XL Take 150 mg by mouth daily.   fluticasone 50 MCG/ACT nasal spray Commonly known as: FLONASE SHAKE LQ AND U 2 SPRAYS IEN QD PRF RHINITIS OR ALLERGIES   Fluticasone-Salmeterol 250-50 MCG/DOSE Aepb Commonly known as: ADVAIR Inhale into the lungs.   glipiZIDE 10 MG tablet Commonly known as: GLUCOTROL Take 1 tablet (10 mg total) by mouth 2 (two) times daily. What changed:   how much to take  when to take this   lisinopril 10 MG tablet Commonly known as: ZESTRIL Take 1 tablet (10 mg total) by mouth daily. What changed: when to take this   loperamide 2 MG tablet Commonly known as: IMODIUM A-D Take 1 tablet (2 mg total) by mouth 4 (four) times daily as needed for diarrhea or loose stools.   omeprazole 20 MG capsule Commonly known  as: PRILOSEC Take by mouth 2 (two) times daily before a meal.   Synjardy XR 12.10-998 MG Tb24 Generic drug: Empagliflozin-metFORMIN HCl ER Take 2 tablets by mouth every morning.   tiZANidine 2 MG tablet Commonly known as: ZANAFLEX Take 2 mg by mouth 3 (three) times daily. prn      Follow-up Information    Idelle Crouch, MD. Schedule an appointment as soon as possible for a visit in 2 week(s).   Specialty: Internal Medicine Contact information: [place] [address] [phone]          Allergies  Allergen Reactions  . Tramadol Itching  . Tegaderm Ag Mesh [Silver] Rash    Consultations:     Procedures/Studies: DG Chest 1 View  Result Date: 08/01/2019 CLINICAL DATA:  44 year old female with cough.  Positive COVID-19.  EXAM: CHEST  1 VIEW COMPARISON:  Chest radiograph dated 03/03/2018. FINDINGS: The heart size and mediastinal contours are within normal limits. Both lungs are clear. The visualized skeletal structures are unremarkable. IMPRESSION: No active disease. Electronically Signed   By: Anner Crete M.D.   On: 08/01/2019 18:52       Subjective: Feeling back to normal.  No dyspnea, sputum, chest tightness, leg swelling, hemoptysis.  No malaise or vomiting.  Diarrhea slowed down.  Discharge Exam: Vitals:   08/01/19 2300 08/02/19 0814  BP: (!) 163/95   Pulse: 100   Resp: 18   Temp: 98.3 F (36.8 C) 98.3 F (36.8 C)  SpO2: 100%    Vitals:   08/01/19 2100 08/01/19 2239 08/01/19 2300 08/02/19 0814  BP: (!) 170/81 (!) 153/85 (!) 163/95   Pulse: (!) 104 (!) 101 100   Resp: '17 16 18   ' Temp:   98.3 F (36.8 C) 98.3 F (36.8 C)  TempSrc:   Oral Oral  SpO2: 97% 96% 100%   Weight:      Height:        General: Pt is alert, awake, not in acute distress Cardiovascular: RRR, nl S1-S2, no murmurs appreciated.   No LE edema.   Respiratory: Normal respiratory rate and rhythm.  CTAB without rales or  wheezes. Abdominal: Abdomen soft and non-tender.  No distension or HSM.   Neuro/Psych: Strength symmetric in upper and lower extremities.  Judgment and insight appear normal.   The results of significant diagnostics from this hospitalization (including imaging, microbiology, ancillary and laboratory) are listed below for reference.     Microbiology: No results found for this or any previous visit (from the past 240 hour(s)).   Labs: BNP (last 3 results) No results for input(s): BNP in the last 8760 hours. Basic Metabolic Panel: Recent Labs  Lab 08/01/19 1708 08/02/19 0847  NA 129* 133*  K 4.8 3.8  CL 97* 99  CO2 16* 21*  GLUCOSE 334* 127*  BUN 22* 21*  CREATININE 1.05* 0.82  CALCIUM 9.7 8.9   Liver Function Tests: Recent Labs  Lab 08/01/19 1708  AST 28  ALT 14  ALKPHOS 51  BILITOT 0.9  PROT 8.9*  ALBUMIN 4.4   Recent Labs  Lab 08/02/19 0415  LIPASE 23   No results for input(s): AMMONIA in the last 168 hours. CBC: Recent Labs  Lab 08/01/19 1708  WBC 16.0*  NEUTROABS 14.5*  HGB 13.9  HCT 43.1  MCV 81.6  PLT 523*   Cardiac Enzymes: No results for input(s): CKTOTAL, CKMB, CKMBINDEX, TROPONINI in the last 168 hours. BNP: Invalid input(s): POCBNP CBG: Recent Labs  Lab 08/02/19 0002 08/02/19 0817  GLUCAP 219* 145*   D-Dimer No results for input(s): DDIMER in the last 72 hours. Hgb A1c Recent Labs    08/02/19 0046  HGBA1C 8.9*   Lipid Profile No results for input(s): CHOL, HDL, LDLCALC, TRIG, CHOLHDL, LDLDIRECT in the last 72 hours. Thyroid function studies No results for input(s): TSH, T4TOTAL, T3FREE, THYROIDAB in the last 72 hours.  Invalid input(s): FREET3 Anemia work up No results for input(s): VITAMINB12, FOLATE, FERRITIN, TIBC, IRON, RETICCTPCT in the last 72 hours. Urinalysis    Component Value Date/Time   COLORURINE YELLOW (A) 08/01/2019 2116   APPEARANCEUR CLEAR (A) 08/01/2019 2116   LABSPEC 1.030 08/01/2019 2116   PHURINE 5.0  08/01/2019 2116   GLUCOSEU >=500 (A) 08/01/2019 2116   HGBUR NEGATIVE 08/01/2019 2116   [place] NEGATIVE 08/01/2019 2116  BILIRUBINUR neg 05/07/2018 1654   KETONESUR 20 (A) 08/01/2019 2116   PROTEINUR 30 (A) 08/01/2019 2116   NITRITE NEGATIVE 08/01/2019 2116   LEUKOCYTESUR NEGATIVE 08/01/2019 2116   Sepsis Labs Invalid input(s): PROCALCITONIN,  WBC,  LACTICIDVEN Microbiology No results found for this or any previous visit (from the past 240 hour(s)).   Time coordinating discharge: 35 minutes The  controlled substances registry was reviewed for this patient      SIGNED:   Edwin Dada, MD  [facility] 08/02/2019, 4:07 PM

## 2019-08-02 NOTE — Progress Notes (Signed)
Pt d/c to home via husband. IV removed intact. VSS>Education completed. All belongings sent with pt. All questions answered.

## 2019-08-03 ENCOUNTER — Other Ambulatory Visit: Payer: Self-pay | Admitting: Pharmacist

## 2019-08-17 ENCOUNTER — Other Ambulatory Visit: Payer: Self-pay

## 2020-03-09 ENCOUNTER — Other Ambulatory Visit: Payer: Self-pay | Admitting: Internal Medicine

## 2020-03-09 DIAGNOSIS — Z1231 Encounter for screening mammogram for malignant neoplasm of breast: Secondary | ICD-10-CM

## 2020-08-23 NOTE — Telephone Encounter (Signed)
To close telephone encounter 

## 2021-02-25 ENCOUNTER — Other Ambulatory Visit: Payer: Self-pay

## 2021-02-25 ENCOUNTER — Emergency Department: Payer: Medicaid Other

## 2021-02-25 ENCOUNTER — Emergency Department
Admission: EM | Admit: 2021-02-25 | Discharge: 2021-02-25 | Disposition: A | Discharge status: Home / Self Care | Payer: Medicaid Other | Attending: Emergency Medicine | Admitting: Emergency Medicine

## 2021-02-25 DIAGNOSIS — Z8616 Personal history of COVID-19: Secondary | ICD-10-CM | POA: Diagnosis not present

## 2021-02-25 DIAGNOSIS — I10 Essential (primary) hypertension: Secondary | ICD-10-CM | POA: Insufficient documentation

## 2021-02-25 DIAGNOSIS — Z79899 Other long term (current) drug therapy: Secondary | ICD-10-CM | POA: Diagnosis not present

## 2021-02-25 DIAGNOSIS — E119 Type 2 diabetes mellitus without complications: Secondary | ICD-10-CM | POA: Insufficient documentation

## 2021-02-25 DIAGNOSIS — S93401A Sprain of unspecified ligament of right ankle, initial encounter: Secondary | ICD-10-CM

## 2021-02-25 DIAGNOSIS — J45909 Unspecified asthma, uncomplicated: Secondary | ICD-10-CM | POA: Diagnosis not present

## 2021-02-25 DIAGNOSIS — S99911A Unspecified injury of right ankle, initial encounter: Secondary | ICD-10-CM | POA: Diagnosis present

## 2021-02-25 DIAGNOSIS — Z7984 Long term (current) use of oral hypoglycemic drugs: Secondary | ICD-10-CM | POA: Insufficient documentation

## 2021-02-25 DIAGNOSIS — Z87891 Personal history of nicotine dependence: Secondary | ICD-10-CM | POA: Diagnosis not present

## 2021-02-25 DIAGNOSIS — W010XXA Fall on same level from slipping, tripping and stumbling without subsequent striking against object, initial encounter: Secondary | ICD-10-CM | POA: Insufficient documentation

## 2021-02-25 MED ORDER — OXYCODONE-ACETAMINOPHEN 7.5-325 MG PO TABS: 1.0000 | ORAL_TABLET | Freq: Four times a day (QID) | ORAL | 0 refills | Status: DC | PRN

## 2021-02-25 MED ORDER — IBUPROFEN 800 MG PO TABS: 800.0000 mg | ORAL_TABLET | Freq: Three times a day (TID) | ORAL | 0 refills | Status: AC | PRN

## 2021-02-25 MED ORDER — OXYCODONE-ACETAMINOPHEN 5-325 MG PO TABS
1.0000 | ORAL_TABLET | Freq: Once | ORAL | Status: AC
  Administered 2021-02-25: 1 via ORAL
  Filled 2021-02-25: qty 1

## 2021-02-25 MED ORDER — IBUPROFEN 800 MG PO TABS
800.0000 mg | ORAL_TABLET | Freq: Once | ORAL | Status: AC
  Administered 2021-02-25: 800 mg via ORAL
  Filled 2021-02-25: qty 1

## 2021-02-25 NOTE — ED Notes (Signed)
See triage note. Pt ambulatory but with pain.

## 2021-02-25 NOTE — ED Provider Notes (Signed)
[facility] Provider Note   ____________________________________________   Event Date/Time   First MD Initiated Contact with Patient 02/25/21 1109     (approximate)  I have reviewed the triage vital signs and the nursing notes.   HISTORY  Chief Complaint Foot Pain    HPI Virginia Peterson is a 45 y.o. female patient presents with right ankle and foot pain secondary to trip and fall last night.  Patient denies LOC or head injury.  Patient pain increased with weightbearing.  Denies loss of sensation.  Rates pain as a 7/10.  Described pain as "achy".  No palliative measures prior to arrival.         Past Medical History:  Diagnosis Date   Allergy    Anxiety    Asthma    WELL CONTROLLED   Depressed    Diabetes mellitus without complication ([place])    Dysthymia 2016   GERD (gastroesophageal reflux disease)    Hyperlipidemia    Hypertension     Patient Active Problem List   Diagnosis Date Noted   COVID-19 08/01/2019   Diarrhea 08/01/2019   Pelvic pain in female 06/28/2017   Morbid obesity with BMI of 40.0-44.9, adult ([place]) 10/03/2016   Asthma without status asthmaticus 07/20/2016   Diabetes mellitus type 2, uncomplicated ([place]) 92/33/0076   Disc disease, degenerative, lumbar or lumbosacral 07/20/2016   Generalized anxiety disorder 07/20/2016   Hyperlipidemia, unspecified 07/20/2016   Hypertension 07/20/2016   Pain syndrome, chronic 07/20/2016   Depression 10/06/2015   Adjustment disorder with mixed anxiety and depressed mood 10/06/2015    Past Surgical History:  Procedure Laterality Date   ABDOMINAL HYSTERECTOMY     partial (fallopian adn uterus removed)   benign neck tumor  2012   [place] N/A 07/04/2017   Procedure: CYSTOSCOPY;  Surgeon: Gae Dry, MD;  Location: [facility] ORS;  Service: Gynecology;  Laterality: N/A;   ESOPHAGOGASTRODUODENOSCOPY (EGD) WITH PROPOFOL N/A 10/12/2016   Procedure:  ESOPHAGOGASTRODUODENOSCOPY (EGD) WITH PROPOFOL;  Surgeon: Jonathon Bellows, MD;  Location[facility] ENDOSCOPY;  Service: Endoscopy;  Laterality: N/A;   LAPAROSCOPIC HYSTERECTOMY Bilateral 07/04/2017   Procedure: HYSTERECTOMY TOTAL LAPAROSCOPIC BILATERAL SALPINGECTOMY;  Surgeon: Gae Dry, MD;  Location: [facility] ORS;  Service: Gynecology;  Laterality: Bilateral;    Prior to Admission medications   Medication Sig Start Date End Date Taking? Authorizing Provider  ibuprofen (ADVIL) 800 MG tablet Take 1 tablet (800 mg total) by mouth every 8 (eight) hours as needed for moderate pain. 02/25/21  Yes Sable Feil, PA-C  oxyCODONE-acetaminophen (PERCOCET) 7.5-325 MG tablet Take 1 tablet by mouth every 6 (six) hours as needed for severe pain. 02/25/21  Yes Sable Feil, PA-C  acetaminophen (TYLENOL) 500 MG tablet Take 500 mg by mouth as needed for headache.    [provider]  albuterol (ACCUNEB) 1.25 MG/3ML nebulizer solution Inhale 1 ampule into the lungs every 6 (six) hours as needed for wheezing or shortness of breath.  06/18/17 06/18/18  [provider]  albuterol (PROVENTIL HFA;VENTOLIN HFA) 108 (90 BASE) MCG/ACT inhaler Inhale 2 puffs into the lungs every 6 (six) hours as needed for wheezing or shortness of breath. 04/25/15   [name], PA-C  ALPRAZolam Duanne Moron) 1 MG tablet Take by mouth as needed for anxiety.     [provider]  amphetamine-dextroamphetamine (ADDERALL) 10 MG tablet Take 10 mg by mouth 2 (two) times daily with a meal.    [provider]  atorvastatin (LIPITOR) 20 MG tablet Take 20 mg by mouth at bedtime.  09/28/16 02/02/19  [provider]  azelastine (ASTELIN) 0.1 % nasal spray Place 1 spray into both nostrils 2 (two) times daily. Use in each nostril as directed    [provider]  blood glucose meter kit and supplies KIT Dispense based on patient and insurance preference. Use once daily. (FOR ICD-9 250.00, 250.01). 08/02/19    Edwin Dada, MD  buPROPion (WELLBUTRIN XL) 150 MG 24 hr tablet Take 150 mg by mouth daily.    [provider]  fluticasone (FLONASE) 50 MCG/ACT nasal spray SHAKE LQ AND U 2 SPRAYS IEN QD PRF RHINITIS OR ALLERGIES 08/14/16   [provider]  Fluticasone-Salmeterol (ADVAIR) 250-50 MCG/DOSE AEPB Inhale into the lungs. 12/13/17 02/02/19  [provider]  glipiZIDE (GLUCOTROL) 10 MG tablet Take 1 tablet (10 mg total) by mouth 2 (two) times daily. Patient taking differently: Take 5 mg by mouth every morning.  06/13/16 02/02/19  Lavonia Drafts, MD  lisinopril (PRINIVIL,ZESTRIL) 10 MG tablet Take 1 tablet (10 mg total) by mouth daily. Patient taking differently: Take 10 mg by mouth 2 (two) times daily.  06/13/16 02/02/19  Lavonia Drafts, MD  loperamide (IMODIUM A-D) 2 MG tablet Take 1 tablet (2 mg total) by mouth 4 (four) times daily as needed for diarrhea or loose stools. 02/22/17   Eula Listen, MD  omeprazole (PRILOSEC) 20 MG capsule Take by mouth 2 (two) times daily before a meal.  09/03/17 02/02/19  [provider]  SYNJARDY XR 12.10-998 MG TB24 Take 2 tablets by mouth every morning.  10/10/16   [provider]  tiZANidine (ZANAFLEX) 2 MG tablet Take 2 mg by mouth 3 (three) times daily. prn    [provider]    Allergies Tramadol and Tegaderm ag mesh [silver]  Family History  Problem Relation Age of Onset   Diabetes Mother    Hyperlipidemia Mother    Hypertension Mother    Depression Mother    Cancer Mother        Low grade, ovarian- remission   Diabetes Maternal Grandfather    Kidney disease Brother    Diabetes Maternal Grandmother     Social History Social History   Tobacco Use   Smoking status: Former    Years: 4.00    Types: Cigarettes    Quit date: 06/28/2004    Years since quitting: 16.6   Smokeless tobacco: Never   Tobacco comments:    1 CIG DAILY  Vaping Use   Vaping Use: Never used  Substance Use Topics    Alcohol use: No   Drug use: No    Review of Systems Constitutional: No fever/chills Eyes: No visual changes. ENT: No sore throat. Cardiovascular: Denies chest pain. Respiratory: Denies shortness of breath. Gastrointestinal: No abdominal pain.  No nausea, no vomiting.  No diarrhea.  No constipation. Genitourinary: Negative for dysuria. Musculoskeletal: Right foot and ankle pain.   Skin: Negative for rash. Neurological: Negative for headaches, focal weakness or numbness. Psychiatric: Anxiety and depression. Endocrine: Diabetes, hyperlipidemia, hypertension. Allergic/Immunilogical: Tramadol ____________________________________________   PHYSICAL EXAM:  VITAL SIGNS: ED Triage Vitals  Enc Vitals Group     BP 02/25/21 1035 (!) 155/78     Pulse Rate 02/25/21 1035 80     Resp 02/25/21 1035 18     Temp 02/25/21 1035 98 F (36.7 C)     Temp Source 02/25/21 1035 Oral     SpO2  02/25/21 1035 98 %     Weight 02/25/21 1036 230 lb (104.3 kg)     Height 02/25/21 1036 '5\' 4"'  (1.626 m)     Head Circumference --      Peak Flow --      Pain Score 02/25/21 1033 7     Pain Loc --      Pain Edu? --      Excl. in [employer]? --     Constitutional: Alert and oriented.  Moderate distress s.  BMI is 39.48. Cardiovascular: Normal rate, regular rhythm. Grossly normal heart sounds.  Good peripheral circulation. Respiratory: Normal respiratory effort.  No retractions. Lungs CTAB. Genitourinary: Deferred Musculoskeletal: No obvious deformity to the left foot/ankle.  Patient has moderate lateral ankle edema.  Patient has full and equal range of motion of the ankle and foot.  Patient can wiggle toes.. Neurologic:  Normal speech and language. No gross focal neurologic deficits are appreciated. No gait instability. Skin:  Skin is warm, dry and intact. No rash noted.  Abrasion right lateral ankle Psychiatric: Mood and affect are normal. Speech and behavior are  normal.  ____________________________________________   LABS (all labs ordered are listed, but only abnormal results are displayed)  Labs Reviewed - No data to display ____________________________________________  EKG   ____________________________________________  RADIOLOGY I, Sable Feil, personally viewed and evaluated these images (plain radiographs) as part of my medical decision making, as well as reviewing the written report by the radiologist.  ED MD interpretation: No acute findings x-ray of the left ankle.  Official radiology report(s): DG Ankle Complete Right  Result Date: 02/25/2021 CLINICAL DATA:  45 year old female with ankle pain and swelling. EXAM: RIGHT ANKLE - COMPLETE 3+ VIEW COMPARISON:  None. FINDINGS: There is no evidence of fracture, dislocation, or joint effusion. There is no evidence of arthropathy or other focal bone abnormality. Soft tissue prominence about the lateral malleolus. IMPRESSION: No acute fracture or malalignment. Soft tissue prominence about the lateral malleolus. Electronically Signed   By: Ruthann Cancer M.D.   On: 02/25/2021 12:04    ____________________________________________   PROCEDURES  Procedure(s) performed (including Critical Care):  Procedures   ____________________________________________   INITIAL IMPRESSION / ASSESSMENT AND PLAN / ED COURSE  As part of my medical decision making, I reviewed the following data within the [place]         Patient presents with left ankle and foot pain secondary to fall last night.  Discussed no acute findings on x-ray with patient.  Patient complaint physical exam consistent with ankle sprain.  Patient given discharge care instruction.  Patient placed in ankle splint and advised take medication as directed.  Follow-up with PCP.      ____________________________________________   FINAL CLINICAL IMPRESSION(S) / ED DIAGNOSES  Final diagnoses:  Moderate right  ankle sprain, initial encounter     ED Discharge Orders          Ordered    Ankle splint        02/25/21 1236    ibuprofen (ADVIL) 800 MG tablet  Every 8 hours PRN        02/25/21 1238    oxyCODONE-acetaminophen (PERCOCET) 7.5-325 MG tablet  Every 6 hours PRN        02/25/21 1238             Note:  This document was prepared using Dragon voice recognition software and may include unintentional dictation errors.    Sable Feil, PA-C 02/25/21  [place] Evan K, MD 02/26/21 [phone]

## 2021-02-25 NOTE — Discharge Instructions (Addendum)
No acute findings on x-ray of the right ankle.  Read and follow discharge care instruction.  Take medication as directed

## 2021-02-25 NOTE — ED Triage Notes (Signed)
Pt comes with c/o right foot pain after fall last night. Pt denies any LOC or hitting head. Pt states pain to walk.

## 2021-07-26 ENCOUNTER — Emergency Department: Payer: Medicaid Other

## 2021-07-26 ENCOUNTER — Emergency Department
Admission: EM | Admit: 2021-07-26 | Discharge: 2021-07-26 | Disposition: A | Discharge status: Home / Self Care | Payer: Medicaid Other | Attending: Emergency Medicine | Admitting: Emergency Medicine

## 2021-07-26 ENCOUNTER — Other Ambulatory Visit: Payer: Self-pay

## 2021-07-26 ENCOUNTER — Encounter: Payer: Self-pay | Admitting: Emergency Medicine

## 2021-07-26 DIAGNOSIS — R0981 Nasal congestion: Secondary | ICD-10-CM | POA: Diagnosis not present

## 2021-07-26 DIAGNOSIS — K2901 Acute gastritis with bleeding: Secondary | ICD-10-CM | POA: Diagnosis not present

## 2021-07-26 DIAGNOSIS — E119 Type 2 diabetes mellitus without complications: Secondary | ICD-10-CM | POA: Diagnosis not present

## 2021-07-26 DIAGNOSIS — I10 Essential (primary) hypertension: Secondary | ICD-10-CM | POA: Insufficient documentation

## 2021-07-26 DIAGNOSIS — R1032 Left lower quadrant pain: Secondary | ICD-10-CM | POA: Diagnosis present

## 2021-07-26 DIAGNOSIS — J45909 Unspecified asthma, uncomplicated: Secondary | ICD-10-CM | POA: Insufficient documentation

## 2021-07-26 LAB — URINALYSIS, ROUTINE W REFLEX MICROSCOPIC
Bacteria, UA: NONE SEEN
Bilirubin Urine: NEGATIVE
Glucose, UA: >=500 mg/dL — AB
Hgb urine dipstick: NEGATIVE
Ketones, ur: NEGATIVE mg/dL
Leukocytes,Ua: NEGATIVE
Nitrite: NEGATIVE
Protein, ur: NEGATIVE mg/dL
Specific Gravity, Urine: 1.033 — ABNORMAL HIGH (ref 1.005–1.030)
pH: 6 (ref 5.0–8.0)

## 2021-07-26 LAB — COMPREHENSIVE METABOLIC PANEL
ALT: 28 U/L (ref 0–44)
AST: 23 U/L (ref 15–41)
Albumin: 4 g/dL (ref 3.5–5.0)
Alkaline Phosphatase: 47 U/L (ref 38–126)
Anion gap: 7 (ref 5–15)
BUN: 12 mg/dL (ref 6–20)
CO2: 21 mmol/L — ABNORMAL LOW (ref 22–32)
Calcium: 9.2 mg/dL (ref 8.9–10.3)
Chloride: 109 mmol/L (ref 98–111)
Creatinine, Ser: 0.76 mg/dL (ref 0.44–1.00)
GFR, Estimated: >60 mL/min (ref >60)
Glucose, Bld: 131 mg/dL — ABNORMAL HIGH (ref 70–99)
Potassium: 4.3 mmol/L (ref 3.5–5.1)
Sodium: 137 mmol/L (ref 135–145)
Total Bilirubin: 0.5 mg/dL (ref 0.3–1.2)
Total Protein: 7.2 g/dL (ref 6.5–8.1)

## 2021-07-26 LAB — CBC
HCT: 39.5 % (ref 36.0–46.0)
Hemoglobin: 13.5 g/dL (ref 12.0–15.0)
MCH: 29.6 pg (ref 26.0–34.0)
MCHC: 34.2 g/dL (ref 30.0–36.0)
MCV: 86.6 fL (ref 80.0–100.0)
Platelets: 395 10*3/uL (ref 150–400)
RBC: 4.56 MIL/uL (ref 3.87–5.11)
RDW: 13 % (ref 11.5–15.5)
WBC: 10.4 10*3/uL (ref 4.0–10.5)
nRBC: 0 % (ref 0.0–0.2)

## 2021-07-26 LAB — LIPASE, BLOOD: Lipase: 43 U/L (ref 11–51)

## 2021-07-26 MED ORDER — OMEPRAZOLE MAGNESIUM 20 MG PO TBEC: 20.0000 mg | DELAYED_RELEASE_TABLET | Freq: Every day | ORAL | 1 refills | Status: AC

## 2021-07-26 MED ORDER — OXYCODONE-ACETAMINOPHEN 5-325 MG PO TABS
1.0000 | ORAL_TABLET | Freq: Once | ORAL | Status: AC
  Administered 2021-07-26: 1 via ORAL
  Filled 2021-07-26: qty 1

## 2021-07-26 MED ORDER — MORPHINE SULFATE (PF) 4 MG/ML IV SOLN
4.0000 mg | Freq: Once | INTRAVENOUS | Status: AC
  Administered 2021-07-26: 4 mg via INTRAVENOUS
  Filled 2021-07-26: qty 1

## 2021-07-26 MED ORDER — ONDANSETRON 4 MG PO TBDP: 4.0000 mg | ORAL_TABLET | Freq: Four times a day (QID) | ORAL | 0 refills | Status: AC | PRN

## 2021-07-26 MED ORDER — OXYCODONE-ACETAMINOPHEN 5-325 MG PO TABS: 1.0000 | ORAL_TABLET | Freq: Four times a day (QID) | ORAL | 0 refills | Status: AC | PRN

## 2021-07-26 MED ORDER — SODIUM CHLORIDE 0.9 % IV BOLUS
1000.0000 mL | Freq: Once | INTRAVENOUS | Status: AC
  Administered 2021-07-26: 1000 mL via INTRAVENOUS

## 2021-07-26 MED ORDER — ONDANSETRON HCL 4 MG/2ML IJ SOLN
4.0000 mg | INTRAMUSCULAR | Status: AC
  Administered 2021-07-26: 4 mg via INTRAVENOUS
  Filled 2021-07-26: qty 2

## 2021-07-26 MED ORDER — IOHEXOL 300 MG/ML  SOLN
100.0000 mL | Freq: Once | INTRAMUSCULAR | Status: AC | PRN
  Administered 2021-07-26: 100 mL via INTRAVENOUS

## 2021-07-26 NOTE — Discharge Instructions (Addendum)
You were seen in the emergency room for abdominal pain. It is important that you follow up closely with your primary care doctor in the next couple of days.  If you're unable to see her primary care doctor you may return to the emergency room or go to the [place] walk-in clinic in 1 or 2 days for reexam.  Please return to the emergency room right away if you are to develop a fever, severe nausea, your pain becomes severe or worsens, you are unable to keep food down, begin vomiting any dark or bloody fluid, you develop any dark or bloody stools, feel dehydrated, or other new concerns or symptoms arise.  No driving this evening or while taking oxycodone.

## 2021-07-26 NOTE — ED Notes (Signed)
No changes, pt to CT via stretcher, NAD, calm, IVF bolus infusing.

## 2021-07-26 NOTE — ED Provider Notes (Signed)
[facility] Provider Note    Event Date/Time   First MD Initiated Contact with Patient 07/26/21 1650     (approximate)   History   Abdominal Pain   HPI  Virginia Peterson is a 46 y.o. female past medical history of diabetes hypertension hyperlipidemia  Since this morning patient woke up with moderate discomfort in her left upper quadrant with vomiting.  She reports she vomited a few times prior to coming to ER and once this morning she noticed small flecks of blood in it.  She denies large volumes of bleeding.  No black or bloody stool.  She also reports she had a slight nasal congestion.  No fevers or chills.  No pain or discomfort with urination  She has been on a strict weight loss regimen and is lost about 100 pounds over the course of year  She used to take ibuprofen but does not take any ibuprofen aspirin or NSAIDs at this time.  Denies history of peptic ulcers.   Past Medical History:  Diagnosis Date   Allergy    Anxiety    Asthma    WELL CONTROLLED   Depressed    Diabetes mellitus without complication ([place])    Dysthymia 2016   GERD (gastroesophageal reflux disease)    Hyperlipidemia    Hypertension           Physical Exam   Triage Vital Signs: ED Triage Vitals  Enc Vitals Group     BP 07/26/21 1509 127/78     Pulse Rate 07/26/21 1509 85     Resp 07/26/21 1509 16     Temp 07/26/21 1509 98.1 F (36.7 C)     Temp Source 07/26/21 1509 Oral     SpO2 07/26/21 1509 98 %     Weight 07/26/21 1453 229 lb 15 oz (104.3 kg)     Height 07/26/21 1453 5\' 4"  (1.626 m)     Head Circumference --      Peak Flow --      Pain Score 07/26/21 1453 10     Pain Loc --      Pain Edu? --      Excl. in [place]? --     Most recent vital signs: Vitals:   07/26/21 1750 07/26/21 1830  BP:  115/60  Pulse: 74 74  Resp:  20  Temp:  98.2 F (36.8 C)  SpO2: 97% 99%     General: Awake, no distress.  Well oriented very pleasant in no acute distress.   Conversant.  Does not appear to be in any significant or overt extremis or pain at this time CV:  Good peripheral perfusion.  Normal heart tones Resp:  Normal effort.  Clear lung sounds Abd:  No distention.  Reports mild tenderness primarily in the left mid the left upper quadrant.  No rebound or guarding negative [place].  No pain or discomfort to rebound, no evidence of acute abdomen or acute peritonitis to noted but does report focal discomfort primarily along the left Other:  Warm well-perfused extremities.  Well oriented   ED Results / Procedures / Treatments   Labs (all labs ordered are listed, but only abnormal results are displayed) Labs Reviewed  COMPREHENSIVE METABOLIC PANEL - Abnormal; Notable for the following components:      Result Value   CO2 21 (*)    Glucose, Bld 131 (*)    All other components within normal limits  URINALYSIS, ROUTINE W REFLEX MICROSCOPIC - Abnormal;  Notable for the following components:   Color, Urine YELLOW (*)    APPearance CLEAR (*)    Specific Gravity, Urine 1.033 (*)    Glucose, UA >=500 (*)    All other components within normal limits  LIPASE, BLOOD  CBC     EKG     RADIOLOGY  Personally reviewed the radiology interpretation,   CT abdomen pelvis reviewed, negative for acute finding.  Fatty liver to noted, also a stable left adrenal mass.  Reportedly likely represent no adenoma  CT ABDOMEN PELVIS W CONTRAST  Result Date: 07/26/2021 CLINICAL DATA:  Left lower quadrant abdominal pain and vomiting. EXAM: CT ABDOMEN AND PELVIS WITH CONTRAST TECHNIQUE: Multidetector CT imaging of the abdomen and pelvis was performed using the standard protocol following bolus administration of intravenous contrast. RADIATION DOSE REDUCTION: This exam was performed according to the departmental dose-optimization program which includes automated exposure control, adjustment of the mA and/or kV according to patient size and/or use of iterative reconstruction  technique. CONTRAST:  129mL OMNIPAQUE IOHEXOL 300 MG/ML  SOLN COMPARISON:  February 22, 2017 FINDINGS: Lower chest: No acute abnormality. Hepatobiliary: There is mild diffuse fatty infiltration of the liver parenchyma. No focal liver abnormality is seen. Status post cholecystectomy. No biliary dilatation. Pancreas: Unremarkable. No pancreatic ductal dilatation or surrounding inflammatory changes. Spleen: Normal in size without focal abnormality. Adrenals/Urinary Tract: A stable 1.4 cm diameter low-attenuation left adrenal mass is seen (axial CT image 31, CT series 2). Kidneys are normal, without renal calculi, focal lesion, or hydronephrosis. Bladder is unremarkable. Stomach/Bowel: Stomach is within normal limits. The appendix is not clearly identified. No evidence of bowel wall thickening, distention, or inflammatory changes. Vascular/Lymphatic: No significant vascular findings are present. No enlarged abdominal or pelvic lymph nodes. Reproductive: Status post hysterectomy. No adnexal masses. Other: No abdominal wall hernia or abnormality. No abdominopelvic ascites. Musculoskeletal: No acute or significant osseous findings. IMPRESSION: 1. Mild diffuse fatty infiltration of the liver. 2. Stable low-attenuation left adrenal mass which likely represents an adrenal adenoma. Electronically Signed   By: Virgina Norfolk M.D.   On: 07/26/2021 19:10        PROCEDURES:  Critical Care performed: No  Procedures   MEDICATIONS ORDERED IN ED: Medications  oxyCODONE-acetaminophen (PERCOCET/ROXICET) 5-325 MG per tablet 1 tablet (has no administration in time range)  morphine (PF) 4 MG/ML injection 4 mg (4 mg Intravenous Given 07/26/21 1832)  ondansetron (ZOFRAN) injection 4 mg (4 mg Intravenous Given 07/26/21 1832)  sodium chloride 0.9 % bolus 1,000 mL (1,000 mLs Intravenous New Bag/Given 07/26/21 1832)  iohexol (OMNIPAQUE) 300 MG/ML solution 100 mL (100 mLs Intravenous Contrast Given 07/26/21 1836)      IMPRESSION / MDM / ASSESSMENT AND PLAN / ED COURSE  I reviewed the triage vital signs and the nursing notes.                              Differential diagnosis includes but is not limited to, abdominal perforation, aortic dissection, cholecystitis, appendicitis, diverticulitis, colitis, esophagitis/gastritis, kidney stone, pyelonephritis, urinary tract infection, aortic aneurysm. All are considered in decision and treatment plan. Based upon the patient's presentation and risk factors, we will obtain CT imaging to evaluate given the location and left-sided abdominal pain which rule out etiologies of diverticulitis etc.  Previous hysterectomy.  On the differential would be peptic ulcer disease, viral etiologies, gastroenteritis gastritis etc.  Finding very unlikely this would represent an acute infarction or vascular emergency.  No associated chest pain or pulmonary symptoms.  She is alert well oriented no distress with reassuring clinical exam at this time.  Does not take any anticoagulants or medications that I think would obviously provoke any sort of ulcer  Reviewed the patient's labs reassuring normal CBC normal urinalysis.  Reviewed the patient's and personally viewed the patient's CT imaging which was negative for acute gross pathology  ----------------------------------------- 8:08 PM on 07/26/2021 ----------------------------------------- Patient resting comfortably at this time pain improved.  She is alert well oriented.  Discussed her findings with her and recommended follow-up with her primary doctor Dr. Doy Hutching as well as with gastroenterology.  She does not currently use any NSAIDs or aspirin.  Will use short course of oxycodone for pain relief as needed, and also reinitiate omeprazole in the event she may have gastritis secondary to he will go back to her or ulcer formation.  My overall impression is likely viral or other acute self-limited gastritis at this point, and I do not see  indication for antibiotic at or admission at this time  Discussed with the patient careful return precautions she is agreeable  I will prescribe the patient a narcotic pain medicine due to their condition which I anticipate will cause at least moderate pain short term. I discussed with the patient safe use of narcotic pain medicines, and that they are not to drive, work in dangerous areas, or ever take more than prescribed (no more than 1 pill every 6 hours). We discussed that this is the type of medication that can be  overdosed on and the risks of this type of medicine. Patient is very agreeable to only use as prescribed and to never use more than prescribed.  Return precautions and treatment recommendations and follow-up discussed with the patient who is agreeable with the plan.  Patient's mother will be driving her home      FINAL CLINICAL IMPRESSION(S) / ED DIAGNOSES   Final diagnoses:  Acute gastritis with hemorrhage, unspecified gastritis type     Rx / DC Orders   ED Discharge Orders          Ordered    omeprazole (PRILOSEC OTC) 20 MG tablet  Daily        07/26/21 2005    oxyCODONE-acetaminophen (PERCOCET/ROXICET) 5-325 MG tablet  Every 6 hours PRN        07/26/21 2005    ondansetron (ZOFRAN-ODT) 4 MG disintegrating tablet  Every 6 hours PRN        07/26/21 2005             Note:  This document was prepared using Dragon voice recognition software and may include unintentional dictation errors.   Delman Kitten, MD 07/26/21 2009

## 2021-07-26 NOTE — ED Notes (Signed)
Pt alert, NAD, calm, interactive, resps e/u, speaking in clear complete sentences. Family x2 at [facility].

## 2021-07-26 NOTE — ED Triage Notes (Signed)
C/O vomiting blood this morning and LLQ abdominal pain and diarrhea since last night.  AOOx3.  Skin warm and dry. NAD

## 2022-04-05 ENCOUNTER — Other Ambulatory Visit: Payer: Self-pay | Admitting: Internal Medicine

## 2022-04-05 DIAGNOSIS — Z1231 Encounter for screening mammogram for malignant neoplasm of breast: Secondary | ICD-10-CM

## 2022-06-26 ENCOUNTER — Ambulatory Visit: Payer: Medicaid Other | Admitting: *Deleted

## 2022-07-16 ENCOUNTER — Ambulatory Visit: Payer: Medicaid Other | Admitting: *Deleted

## 2022-07-27 ENCOUNTER — Ambulatory Visit: Payer: Medicaid Other | Admitting: *Deleted

## 2022-08-10 ENCOUNTER — Ambulatory Visit: Payer: Medicaid Other | Admitting: *Deleted

## 2022-08-22 ENCOUNTER — Encounter: Payer: Medicaid Other | Attending: Internal Medicine | Admitting: Skilled Nursing Facility1

## 2022-08-22 ENCOUNTER — Encounter: Payer: Self-pay | Admitting: Skilled Nursing Facility1

## 2022-08-22 VITALS — Ht 64.0 in | Wt 211.8 lb

## 2022-08-22 DIAGNOSIS — Z6836 Body mass index (BMI) 36.0-36.9, adult: Secondary | ICD-10-CM | POA: Diagnosis not present

## 2022-08-22 DIAGNOSIS — E119 Type 2 diabetes mellitus without complications: Secondary | ICD-10-CM | POA: Diagnosis not present

## 2022-08-22 DIAGNOSIS — Z7985 Long-term (current) use of injectable non-insulin antidiabetic drugs: Secondary | ICD-10-CM | POA: Insufficient documentation

## 2022-08-22 DIAGNOSIS — Z7984 Long term (current) use of oral hypoglycemic drugs: Secondary | ICD-10-CM | POA: Insufficient documentation

## 2022-08-22 DIAGNOSIS — I10 Essential (primary) hypertension: Secondary | ICD-10-CM | POA: Insufficient documentation

## 2022-08-22 DIAGNOSIS — Z713 Dietary counseling and surveillance: Secondary | ICD-10-CM | POA: Insufficient documentation

## 2022-08-22 DIAGNOSIS — E669 Obesity, unspecified: Secondary | ICD-10-CM | POA: Insufficient documentation

## 2022-08-22 NOTE — Progress Notes (Signed)
Pt state she has a wedding coming up and wants to focus more intently on her weight loss. Pt states she has been working on weight loss stating she was 320 pounds.  Pt states she is uncomfortable with going to bed at 96 worried it would drop int he night. Pt states she got a reading of about 44 shortly after having her tooth pulled stating sh ewas not eating much.  Pt states she avoids dark sodas.  Pt states she feels depressed with loses in her family.  Pt states her and er husband and her uncle and her mom love together. The pt is in charge of the cooking.   A1C 6.4  Other Dx: Allergies Anxiety Asthma Depression GERD Hyperlipidemia HTN  DM meds:  Ozempic Farxiga metformin  Pt states she checks her blood sugars 4-5 times a day: fasting 95-113, in the day 90-100, 154 before dinner.   Goals: Go for a walk 3 days a week or if not a nice day walk in the mall Eat the skin on the potato Create balanced meals Have at least 1 full cup non starchy vegetables with each meal Start eating within 1-1.5 hours of waking, every 3-5 hours, stopping 3 hours before bed  Diabetes Self-Management Education  Visit Type: First/Initial   [id number]  Virginia Peterson, identified by name and date of birth, is a 47 y.o. female with a diagnosis of Diabetes: Type 2.   ASSESSMENT  Height 5\' 4"  (1.626 m), weight 211 lb 12.8 oz (96.1 kg), last menstrual period 05/28/2017. Body mass index is 36.36 kg/m.   Diabetes Self-Management Education - 08/22/22 1539       Visit Information   Visit Type First/Initial      Initial Visit   Diabetes Type Type 2    Are you currently following a meal plan? No    Are you taking your medications as prescribed? Yes      Health Coping   How would you rate your overall health? Good      Psychosocial Assessment   Patient Belief/Attitude about Diabetes Motivated to manage diabetes    What is the hardest part about your diabetes right now, causing you the most  concern, or is the most worrisome to you about your diabetes?   Making healty food and beverage choices    Self-management support Family    Patient Concerns Nutrition/Meal planning    Special Needs None    Preferred Learning Style Visual    Learning Readiness Change in progress    How often do you need to have someone help you when you read instructions, pamphlets, or other written materials from your doctor or pharmacy? 1 - Never      Pre-Education Assessment   Patient understands the diabetes disease and treatment process. Needs Instruction    Patient understands incorporating nutritional management into lifestyle. Needs Instruction    Patient undertands incorporating physical activity into lifestyle. Needs Instruction    Patient understands using medications safely. Needs Instruction    Patient understands monitoring blood glucose, interpreting and using results Needs Instruction    Patient understands prevention, detection, and treatment of acute complications. Needs Instruction    Patient understands prevention, detection, and treatment of chronic complications. Needs Instruction    Patient understands how to develop strategies to address psychosocial issues. Needs Instruction    Patient understands how to develop strategies to promote health/change behavior. Needs Instruction      Complications   Last HgB A1C per  patient/outside source 6.4 %    How often do you check your blood sugar? > 4 times/day    Fasting Blood glucose range (mg/dL) 70-129    Postprandial Blood glucose range (mg/dL) 130-179;70-129    Number of hypoglycemic episodes per month 1    Can you tell when your blood sugar is low? No    What do you do if your blood sugar is low? eats something    Number of hyperglycemic episodes ( >200mg /dL): Never    Can you tell when your blood sugar is high? No    Have you had a dilated eye exam in the past 12 months? Yes    Have you had a dental exam in the past 12 months? Yes     Are you checking your feet? No      Dietary Intake   Breakfast 10: eggs + low sodium bacon or corn beef or sausage or spam or cereal    Lunch skipped    Dinner 5:30 ziti and meatsauce with bread or corn beef and cabbage or gizzard soup + tortalini    Snack (evening) 9am: cookies + milk    Beverage(s) milk, water, [place] green tea, sprite      Activity / Exercise   Activity / Exercise Type ADL's    How many days per week do you exercise? 0    How many minutes per day do you exercise? 0    Total minutes per week of exercise 0      Patient Education   Previous Diabetes Education Yes (please comment)    Disease Pathophysiology Definition of diabetes, type 1 and 2, and the diagnosis of diabetes    Healthy Eating Meal options for control of blood glucose level and chronic complications.;Information on hints to eating out and maintain blood glucose control.;Carbohydrate counting;Role of diet in the treatment of diabetes and the relationship between the three main macronutrients and blood glucose level;Food label reading, portion sizes and measuring food.;Plate Method    Being Active Role of exercise on diabetes management, blood pressure control and cardiac health.    Medications Reviewed patients medication for diabetes, action, purpose, timing of dose and side effects.    Monitoring Yearly dilated eye exam;Daily foot exams;Interpreting lab values - A1C, lipid, urine microalbumina.;Taught/evaluated SMBG meter.    Acute complications Taught prevention, symptoms, and  treatment of hypoglycemia - the 15 rule.;Discussed and identified patients' prevention, symptoms, and treatment of hyperglycemia.    Chronic complications Dental care;Retinopathy and reason for yearly dilated eye exams;Nephropathy, what it is, prevention of, the use of ACE, ARB's and early detection of through urine microalbumia.    Diabetes Stress and Support Role of stress on diabetes;Worked with patient to identify barriers to care  and solutions;Identified and addressed patients feelings and concerns about diabetes      Individualized Goals (developed by patient)   Nutrition Follow meal plan discussed;General guidelines for healthy choices and portions discussed    Physical Activity Exercise 5-7 days per week;45 minutes per day;60 minutes per day    Medications take my medication as prescribed    Problem Solving Eating Pattern      Post-Education Assessment   Patient understands the diabetes disease and treatment process. Comprehends key points    Patient understands incorporating nutritional management into lifestyle. Comprehends key points    Patient undertands incorporating physical activity into lifestyle. Comprehends key points    Patient understands using medications safely. Comphrehends key points    Patient understands  monitoring blood glucose, interpreting and using results Comprehends key points    Patient understands prevention, detection, and treatment of acute complications. Comprehends key points    Patient understands prevention, detection, and treatment of chronic complications. Comprehends key points    Patient understands how to develop strategies to address psychosocial issues. Comprehends key points    Patient understands how to develop strategies to promote health/change behavior. Comprehends key points      Outcomes   Expected Outcomes Demonstrated interest in learning. Expect positive outcomes    Future DMSE 4-6 wks    Program Status Completed             Individualized Plan for Diabetes Self-Management Training:   Learning Objective:  Patient will have a greater understanding of diabetes self-management. Patient education plan is to attend individual and/or group sessions per assessed needs and concerns.   Plan:   There are no Patient Instructions on file for this visit.  Expected Outcomes:  Demonstrated interest in learning. Expect positive outcomes  Education material provided:  ADA - How to Thrive: A Guide for Your Journey with Diabetes, Meal plan card, My Plate, and Snack sheet  If problems or questions, patient to contact team via:  Phone and Email  Future DSME appointment: 4-6 wks

## 2022-09-05 ENCOUNTER — Ambulatory Visit
Admission: RE | Admit: 2022-09-05 | Discharge: 2022-09-05 | Disposition: A | Discharge status: Home / Self Care | Payer: Medicaid Other | Source: Ambulatory Visit | Attending: Internal Medicine | Admitting: Internal Medicine

## 2022-09-05 DIAGNOSIS — Z1231 Encounter for screening mammogram for malignant neoplasm of breast: Secondary | ICD-10-CM | POA: Diagnosis present

## 2022-09-10 ENCOUNTER — Other Ambulatory Visit: Payer: Self-pay | Admitting: Internal Medicine

## 2022-09-10 DIAGNOSIS — N6489 Other specified disorders of breast: Secondary | ICD-10-CM

## 2022-09-10 DIAGNOSIS — R928 Other abnormal and inconclusive findings on diagnostic imaging of breast: Secondary | ICD-10-CM

## 2022-09-13 ENCOUNTER — Ambulatory Visit
Admission: RE | Admit: 2022-09-13 | Discharge: 2022-09-13 | Disposition: A | Discharge status: Home / Self Care | Payer: Medicaid Other | Source: Ambulatory Visit | Attending: Internal Medicine | Admitting: Internal Medicine

## 2022-09-13 DIAGNOSIS — N6489 Other specified disorders of breast: Secondary | ICD-10-CM

## 2022-09-13 DIAGNOSIS — R928 Other abnormal and inconclusive findings on diagnostic imaging of breast: Secondary | ICD-10-CM

## 2023-06-27 ENCOUNTER — Ambulatory Visit: Payer: MEDICAID | Admitting: Dermatology

## 2023-07-03 ENCOUNTER — Encounter
Admission: RE | Disposition: A | Discharge status: Home / Self Care | Payer: Self-pay | Source: Home / Self Care | Attending: Internal Medicine

## 2023-07-03 ENCOUNTER — Ambulatory Visit: Payer: MEDICAID | Admitting: Anesthesiology

## 2023-07-03 ENCOUNTER — Encounter: Payer: Self-pay | Admitting: Internal Medicine

## 2023-07-03 ENCOUNTER — Ambulatory Visit
Admission: RE | Admit: 2023-07-03 | Discharge: 2023-07-03 | Disposition: A | Discharge status: Home / Self Care | Payer: MEDICAID | Attending: Internal Medicine | Admitting: Internal Medicine

## 2023-07-03 DIAGNOSIS — K529 Noninfective gastroenteritis and colitis, unspecified: Secondary | ICD-10-CM | POA: Diagnosis not present

## 2023-07-03 DIAGNOSIS — Z7985 Long-term (current) use of injectable non-insulin antidiabetic drugs: Secondary | ICD-10-CM | POA: Diagnosis not present

## 2023-07-03 DIAGNOSIS — J45909 Unspecified asthma, uncomplicated: Secondary | ICD-10-CM | POA: Insufficient documentation

## 2023-07-03 DIAGNOSIS — K640 First degree hemorrhoids: Secondary | ICD-10-CM | POA: Insufficient documentation

## 2023-07-03 DIAGNOSIS — Z87891 Personal history of nicotine dependence: Secondary | ICD-10-CM | POA: Diagnosis not present

## 2023-07-03 DIAGNOSIS — Z7951 Long term (current) use of inhaled steroids: Secondary | ICD-10-CM | POA: Insufficient documentation

## 2023-07-03 DIAGNOSIS — Z1211 Encounter for screening for malignant neoplasm of colon: Secondary | ICD-10-CM | POA: Diagnosis present

## 2023-07-03 DIAGNOSIS — K2970 Gastritis, unspecified, without bleeding: Secondary | ICD-10-CM | POA: Insufficient documentation

## 2023-07-03 DIAGNOSIS — E119 Type 2 diabetes mellitus without complications: Secondary | ICD-10-CM | POA: Diagnosis not present

## 2023-07-03 DIAGNOSIS — Z79899 Other long term (current) drug therapy: Secondary | ICD-10-CM | POA: Insufficient documentation

## 2023-07-03 DIAGNOSIS — F419 Anxiety disorder, unspecified: Secondary | ICD-10-CM | POA: Diagnosis not present

## 2023-07-03 DIAGNOSIS — Z7984 Long term (current) use of oral hypoglycemic drugs: Secondary | ICD-10-CM | POA: Diagnosis not present

## 2023-07-03 DIAGNOSIS — K2100 Gastro-esophageal reflux disease with esophagitis, without bleeding: Secondary | ICD-10-CM | POA: Diagnosis not present

## 2023-07-03 DIAGNOSIS — E785 Hyperlipidemia, unspecified: Secondary | ICD-10-CM | POA: Insufficient documentation

## 2023-07-03 DIAGNOSIS — I10 Essential (primary) hypertension: Secondary | ICD-10-CM | POA: Diagnosis not present

## 2023-07-03 DIAGNOSIS — R1032 Left lower quadrant pain: Secondary | ICD-10-CM | POA: Insufficient documentation

## 2023-07-03 DIAGNOSIS — Z6841 Body Mass Index (BMI) 40.0 and over, adult: Secondary | ICD-10-CM | POA: Diagnosis not present

## 2023-07-03 DIAGNOSIS — F32A Depression, unspecified: Secondary | ICD-10-CM | POA: Diagnosis not present

## 2023-07-03 HISTORY — PX: COLONOSCOPY WITH PROPOFOL: SHX5780

## 2023-07-03 HISTORY — PX: BIOPSY: SHX5522

## 2023-07-03 HISTORY — PX: ESOPHAGOGASTRODUODENOSCOPY (EGD) WITH PROPOFOL: SHX5813

## 2023-07-03 LAB — GLUCOSE, CAPILLARY: Glucose-Capillary: 110 mg/dL — ABNORMAL HIGH (ref 70–99)

## 2023-07-03 SURGERY — COLONOSCOPY WITH PROPOFOL
Anesthesia: General

## 2023-07-03 MED ORDER — GLYCOPYRROLATE 0.2 MG/ML IJ SOLN
INTRAMUSCULAR | Status: DC | PRN
  Administered 2023-07-03: .2 mg via INTRAVENOUS

## 2023-07-03 MED ORDER — DEXMEDETOMIDINE HCL IN NACL 80 MCG/20ML IV SOLN
INTRAVENOUS | Status: DC | PRN
  Administered 2023-07-03: 20 ug via INTRAVENOUS

## 2023-07-03 MED ORDER — PROPOFOL 500 MG/50ML IV EMUL
INTRAVENOUS | Status: DC | PRN
  Administered 2023-07-03: 100 ug/kg/min via INTRAVENOUS

## 2023-07-03 MED ORDER — SODIUM CHLORIDE 0.9 % IV SOLN: INTRAVENOUS | Status: DC

## 2023-07-03 MED ORDER — LIDOCAINE HCL (CARDIAC) PF 100 MG/5ML IV SOSY
PREFILLED_SYRINGE | INTRAVENOUS | Status: DC | PRN
  Administered 2023-07-03: 100 mg via INTRAVENOUS

## 2023-07-03 MED ORDER — PROPOFOL 10 MG/ML IV BOLUS
INTRAVENOUS | Status: DC | PRN
  Administered 2023-07-03: 50 mg via INTRAVENOUS

## 2023-07-03 NOTE — H&P (Signed)
Outpatient short stay form Pre-procedure 07/03/2023 10:15 AM Virginia Peterson K. Norma Fredrickson, M.D.  Primary Physician: Aram Beecham, M.D.  Reason for visit: Colon cancer screening  History of present illness:  Virginia Peterson presents for f/u GERD, colon cancer screening. Virginia Peterson reports minimal GERD symptoms when avoiding spicy foods and seasonings as well as soda pop. She stopped her omeprazole and takes TUMS a couple of times a month when she gets heartburn. Denies dysphagia, nausea, vomiting. Denies change in bowel habits, rectal bleeding. She reports 3 weeks of LLQ pain intermittently , non-radiating, severity currently of 1 out of 10, "though it can get up to a 7". Has hx of partial hysterectomy. Has retained ovaries. She has upcoming appt with OB/GYN for evaluation. She is unaware of her father's GI history and admits her mother has resisted colonoscopy and other screening measures. She understands she can undergo cologuard testing as an alternative, but wishes to have a colonoscopy.     Current Facility-Administered Medications:    0.9 %  sodium chloride infusion, , Intravenous, Continuous, [place], Boykin Nearing, MD, Last Rate: 20 mL/hr at 07/03/23 1004, New Bag at 07/03/23 1004  Medications Prior to Admission  Medication Sig Dispense Refill Last Dose/Taking   ALPRAZolam (XANAX) 1 MG tablet Take by mouth as needed for anxiety.    07/03/2023 at  7:00 AM   lisinopril (PRINIVIL,ZESTRIL) 10 MG tablet Take 1 tablet (10 mg total) by mouth daily. (Patient taking differently: Take 10 mg by mouth 2 (two) times daily.) 30 tablet 0 07/03/2023 at  7:00 AM   Semaglutide (OZEMPIC, 0.25 OR 0.5 MG/DOSE, [place]) Inject 0.5 mg into the skin once a week.   06/21/2023   acetaminophen (TYLENOL) 500 MG tablet Take 500 mg by mouth as needed for headache.      albuterol (ACCUNEB) 1.25 MG/3ML nebulizer solution Inhale 1 ampule into the lungs every 6 (six) hours as needed for wheezing or shortness of breath.       albuterol (PROVENTIL  HFA;VENTOLIN HFA) 108 (90 BASE) MCG/ACT inhaler Inhale 2 puffs into the lungs every 6 (six) hours as needed for wheezing or shortness of breath. 1 Inhaler 2    amphetamine-dextroamphetamine (ADDERALL) 10 MG tablet Take 10 mg by mouth 2 (two) times daily with a meal.      atorvastatin (LIPITOR) 20 MG tablet Take 20 mg by mouth at bedtime.       azelastine (ASTELIN) 0.1 % nasal spray Place 1 spray into both nostrils 2 (two) times daily. Use in each nostril as directed      blood glucose meter kit and supplies KIT Dispense based on patient and insurance preference. Use once daily. (FOR ICD-9 250.00, 250.01). 1 each 0    buPROPion (WELLBUTRIN XL) 150 MG 24 hr tablet Take 150 mg by mouth daily.      fluticasone (FLONASE) 50 MCG/ACT nasal spray SHAKE LQ AND U 2 SPRAYS IEN QD PRF RHINITIS OR ALLERGIES  3    Fluticasone-Salmeterol (ADVAIR) 250-50 MCG/DOSE AEPB Inhale into the lungs.      glipiZIDE (GLUCOTROL) 10 MG tablet Take 1 tablet (10 mg total) by mouth 2 (two) times daily. (Patient taking differently: Take 5 mg by mouth every morning. ) 60 tablet 0    ibuprofen (ADVIL) 800 MG tablet Take 1 tablet (800 mg total) by mouth every 8 (eight) hours as needed for moderate pain. 15 tablet 0    loperamide (IMODIUM A-D) 2 MG tablet Take 1 tablet (2 mg total) by mouth 4 (four)  times daily as needed for diarrhea or loose stools. 12 tablet 0    omeprazole (PRILOSEC OTC) 20 MG tablet Take 1 tablet (20 mg total) by mouth daily. 28 tablet 1    omeprazole (PRILOSEC) 20 MG capsule Take by mouth 2 (two) times daily before a meal.       ondansetron (ZOFRAN-ODT) 4 MG disintegrating tablet Take 1 tablet (4 mg total) by mouth every 6 (six) hours as needed for nausea or vomiting. 20 tablet 0    oxyCODONE-acetaminophen (PERCOCET/ROXICET) 5-325 MG tablet Take 1 tablet by mouth every 6 (six) hours as needed for severe pain. 16 tablet 0    SYNJARDY XR 12.10-998 MG TB24 Take 2 tablets by mouth every morning.   4    tiZANidine  (ZANAFLEX) 2 MG tablet Take 2 mg by mouth 3 (three) times daily. prn        Allergies  Allergen Reactions   Tramadol Itching   Tegaderm Ag Mesh [Silver] Rash     Past Medical History:  Diagnosis Date   Allergy    Anxiety    Asthma    WELL CONTROLLED   Depressed    Diabetes mellitus without complication (HCC)    Dysthymia 2016   GERD (gastroesophageal reflux disease)    Hyperlipidemia    Hypertension     Review of systems:  Otherwise negative.    Physical Exam  Gen: Alert, oriented. Appears stated age.  HEENT: [place]/AT. PERRLA. Lungs: CTA, no wheezes. CV: RR nl S1, S2. Abd: soft, benign, no masses. BS+ Ext: No edema. Pulses 2+    Planned procedures: Proceed with colonoscopy. The patient understands the nature of the planned procedure, indications, risks, alternatives and potential complications including but not limited to bleeding, infection, perforation, damage to internal organs and possible oversedation/side effects from anesthesia. The patient agrees and gives consent to proceed.  Please refer to procedure notes for findings, recommendations and patient disposition/instructions.     Damier Disano K. Norma Fredrickson, M.D. Gastroenterology 07/03/2023  10:15 AM

## 2023-07-03 NOTE — Interval H&P Note (Signed)
History and Physical Interval Note:  07/03/2023 10:48 AM  Virginia Peterson  has presented today for surgery, with the diagnosis of Z12.11 (ICD-10-CM) - Colon cancer screening K21.9 (ICD-10-CM) - Gastroesophageal reflux disease without esophagitis.  The various methods of treatment have been discussed with the patient and family. After consideration of risks, benefits and other options for treatment, the patient has consented to  Procedure(s): COLONOSCOPY WITH PROPOFOL (N/A) ESOPHAGOGASTRODUODENOSCOPY (EGD) WITH PROPOFOL (N/A) as a surgical intervention.  The patient's history has been reviewed, patient examined, no change in status, stable for surgery.  I have reviewed the patient's chart and labs.  Questions were answered to the patient's satisfaction.     [place], Anselmo

## 2023-07-03 NOTE — Interval H&P Note (Signed)
History and Physical Interval Note:  07/03/2023 10:16 AM  Virginia Peterson  has presented today for surgery, with the diagnosis of Z12.11 (ICD-10-CM) - Colon cancer screening K21.9 (ICD-10-CM) - Gastroesophageal reflux disease without esophagitis.  The various methods of treatment have been discussed with the patient and family. After consideration of risks, benefits and other options for treatment, the patient has consented to  Procedure(s): COLONOSCOPY WITH PROPOFOL (N/A) ESOPHAGOGASTRODUODENOSCOPY (EGD) WITH PROPOFOL (N/A) as a surgical intervention.  The patient's history has been reviewed, patient examined, no change in status, stable for surgery.  I have reviewed the patient's chart and labs.  Questions were answered to the patient's satisfaction.     [place], [place]

## 2023-07-03 NOTE — H&P (Signed)
Outpatient short stay form Pre-procedure 07/03/2023 10:47 AM Virginia Peterson, M.D.  Primary Physician: Aram Beecham, M.D.  Reason for visit:  LLQ pain, GERD  History of present illness:   Virginia Peterson presents for f/u GERD, colon cancer screening. Virginia Peterson reports minimal GERD symptoms when avoiding spicy foods and seasonings as well as soda pop. She stopped her omeprazole and takes TUMS a couple of times a month when she gets heartburn. Denies dysphagia, nausea, vomiting. Denies change in bowel habits, rectal bleeding. She reports 3 weeks of LLQ pain intermittently , non-radiating, severity currently of 1 out of 10, "though it can get up to a 7". Has hx of partial hysterectomy. Has retained ovaries. She has upcoming appt with OB/GYN for evaluation. She is unaware of her father's GI history and admits her mother has resisted colonoscopy and other screening measures. She understands she can undergo cologuard testing as an alternative, but wishes to have a colonoscopy.       Current Facility-Administered Medications:    0.9 %  sodium chloride infusion, , Intravenous, Continuous, [place], Boykin Nearing, MD, Last Rate: 20 mL/hr at 07/03/23 1004, New Bag at 07/03/23 1004  Medications Prior to Admission  Medication Sig Dispense Refill Last Dose/Taking   ALPRAZolam (XANAX) 1 MG tablet Take by mouth as needed for anxiety.    07/03/2023 at  7:00 AM   lisinopril (PRINIVIL,ZESTRIL) 10 MG tablet Take 1 tablet (10 mg total) by mouth daily. (Patient taking differently: Take 10 mg by mouth 2 (two) times daily.) 30 tablet 0 07/03/2023 at  7:00 AM   Semaglutide (OZEMPIC, 0.25 OR 0.5 MG/DOSE, [place]) Inject 0.5 mg into the skin once a week.   06/21/2023   acetaminophen (TYLENOL) 500 MG tablet Take 500 mg by mouth as needed for headache.      albuterol (ACCUNEB) 1.25 MG/3ML nebulizer solution Inhale 1 ampule into the lungs every 6 (six) hours as needed for wheezing or shortness of breath.       albuterol (PROVENTIL  HFA;VENTOLIN HFA) 108 (90 BASE) MCG/ACT inhaler Inhale 2 puffs into the lungs every 6 (six) hours as needed for wheezing or shortness of breath. 1 Inhaler 2    amphetamine-dextroamphetamine (ADDERALL) 10 MG tablet Take 10 mg by mouth 2 (two) times daily with a meal.      atorvastatin (LIPITOR) 20 MG tablet Take 20 mg by mouth at bedtime.       azelastine (ASTELIN) 0.1 % nasal spray Place 1 spray into both nostrils 2 (two) times daily. Use in each nostril as directed      blood glucose meter kit and supplies KIT Dispense based on patient and insurance preference. Use once daily. (FOR ICD-9 250.00, 250.01). 1 each 0    buPROPion (WELLBUTRIN XL) 150 MG 24 hr tablet Take 150 mg by mouth daily.      fluticasone (FLONASE) 50 MCG/ACT nasal spray SHAKE LQ AND U 2 SPRAYS IEN QD PRF RHINITIS OR ALLERGIES  3    Fluticasone-Salmeterol (ADVAIR) 250-50 MCG/DOSE AEPB Inhale into the lungs.      glipiZIDE (GLUCOTROL) 10 MG tablet Take 1 tablet (10 mg total) by mouth 2 (two) times daily. (Patient taking differently: Take 5 mg by mouth every morning. ) 60 tablet 0    ibuprofen (ADVIL) 800 MG tablet Take 1 tablet (800 mg total) by mouth every 8 (eight) hours as needed for moderate pain. 15 tablet 0    loperamide (IMODIUM A-D) 2 MG tablet Take 1 tablet (2 mg total)  by mouth 4 (four) times daily as needed for diarrhea or loose stools. 12 tablet 0    omeprazole (PRILOSEC OTC) 20 MG tablet Take 1 tablet (20 mg total) by mouth daily. 28 tablet 1    omeprazole (PRILOSEC) 20 MG capsule Take by mouth 2 (two) times daily before a meal.       ondansetron (ZOFRAN-ODT) 4 MG disintegrating tablet Take 1 tablet (4 mg total) by mouth every 6 (six) hours as needed for nausea or vomiting. 20 tablet 0    oxyCODONE-acetaminophen (PERCOCET/ROXICET) 5-325 MG tablet Take 1 tablet by mouth every 6 (six) hours as needed for severe pain. 16 tablet 0    SYNJARDY XR 12.10-998 MG TB24 Take 2 tablets by mouth every morning.   4    tiZANidine  (ZANAFLEX) 2 MG tablet Take 2 mg by mouth 3 (three) times daily. prn        Allergies  Allergen Reactions   Tramadol Itching   Tegaderm Ag Mesh [Silver] Rash     Past Medical History:  Diagnosis Date   Allergy    Anxiety    Asthma    WELL CONTROLLED   Depressed    Diabetes mellitus without complication (HCC)    Dysthymia 2016   GERD (gastroesophageal reflux disease)    Hyperlipidemia    Hypertension     Review of systems:  Otherwise negative.    Physical Exam  Gen: Alert, oriented. Appears stated age.  HEENT: [place]/AT. PERRLA. Lungs: CTA, no wheezes. CV: RR nl S1, S2. Abd: soft, benign, no masses. BS+ Ext: No edema. Pulses 2+    Planned procedures: Proceed with EGD. The patient understands the nature of the planned procedure, indications, risks, alternatives and potential complications including but not limited to bleeding, infection, perforation, damage to internal organs and possible oversedation/side effects from anesthesia. The patient agrees and gives consent to proceed.  Please refer to procedure notes for findings, recommendations and patient disposition/instructions.     Nioka Thorington K. Norma Peterson, M.D. Gastroenterology 07/03/2023  10:47 AM

## 2023-07-03 NOTE — Op Note (Signed)
[facility] Gastroenterology Patient Name: Virginia Peterson Procedure Date: 07/03/2023 10:42 AM MRN: [id number] Account #: [id number] Date of Birth: [birth year] Admit Type: Outpatient Age: 48 Room: [facility] ENDO ROOM 2 Gender: Female Note Status: Finalized Instrument Name: Upper Endoscope [id number] Procedure:             Upper GI endoscopy Indications:           Abdominal pain in the left lower quadrant,                         Gastro-esophageal reflux disease Providers:             Boykin Nearing. Norma Fredrickson MD, MD Referring MD:          Duane Lope. Judithann Sheen, MD (Referring MD) Medicines:             Propofol per Anesthesia Complications:         No immediate complications. Estimated blood loss:                         Minimal. Procedure:             Pre-Anesthesia Assessment:                        - The risks and benefits of the procedure and the                         sedation options and risks were discussed with the                         patient. All questions were answered and informed                         consent was obtained.                        - Patient identification and proposed procedure were                         verified prior to the procedure by the nurse. The                         procedure was verified in the procedure room.                        - ASA Grade Assessment: III - A patient with severe                         systemic disease.                        - After reviewing the risks and benefits, the patient                         was deemed in satisfactory condition to undergo the                         procedure.                        After obtaining  informed consent, the endoscope was                         passed under direct vision. Throughout the procedure,                         the patient's blood pressure, pulse, and oxygen                         saturations were monitored continuously. The Endoscope                         was  introduced through the mouth, and advanced to the                         third part of duodenum. The upper GI endoscopy was                         accomplished without difficulty. The patient tolerated                         the procedure well. Findings:      The esophagus was normal.      Patchy mild inflammation characterized by congestion (edema) and       erythema was found in the gastric antrum. Biopsies were taken with a       cold forceps for Helicobacter pylori testing.      No other significant abnormalities were identified in a careful       examination of the stomach.      The cardia and gastric fundus were normal on retroflexion.      The examined duodenum was normal. Impression:            - Normal esophagus.                        - Gastritis. Biopsied.                        - Normal examined duodenum. Recommendation:        - Await pathology results.                        - Proceed with colonoscopy Procedure Code(s):     --- Professional ---                        [phone], Esophagogastroduodenoscopy, flexible,                         transoral; with biopsy, single or multiple Diagnosis Code(s):     --- Professional ---                        K21.9, Gastro-esophageal reflux disease without                         esophagitis                        R10.32, Left lower quadrant pain  K29.70, Gastritis, unspecified, without bleeding CPT copyright 2022 American Medical Association. All rights reserved. The codes documented in this report are preliminary and upon coder review may  be revised to meet current compliance requirements. Stanton Kidney MD, MD 07/03/2023 10:56:12 AM This report has been signed electronically. Number of Addenda: 0 Note Initiated On: 07/03/2023 10:42 AM Estimated Blood Loss:  Estimated blood loss was minimal.      [facility]

## 2023-07-03 NOTE — Anesthesia Preprocedure Evaluation (Signed)
Anesthesia Evaluation  Patient identified by MRN, date of birth, ID band Patient awake    Reviewed: Allergy & Precautions, NPO status , Patient's Chart, lab work & pertinent test results  History of Anesthesia Complications Negative for: history of anesthetic complications  Airway Mallampati: III  TM Distance: >3 FB Neck ROM: full    Dental no notable dental hx.    Pulmonary asthma , former smoker   Pulmonary exam normal        Cardiovascular hypertension, On Medications negative cardio ROS Normal cardiovascular exam     Neuro/Psych  PSYCHIATRIC DISORDERS Anxiety Depression    negative neurological ROS     GI/Hepatic Neg liver ROS,GERD  Medicated,,  Endo/Other  diabetes, Type 2  Class 3 obesity  Renal/GU negative Renal ROS  negative genitourinary   Musculoskeletal   Abdominal   Peds  Hematology negative hematology ROS (+)   Anesthesia Other Findings Past Medical History: No date: Allergy No date: Anxiety No date: Asthma     Comment:  WELL CONTROLLED No date: Depressed No date: Diabetes mellitus without complication (HCC) 2016: Dysthymia No date: GERD (gastroesophageal reflux disease) No date: Hyperlipidemia No date: Hypertension  Past Surgical History: No date: ABDOMINAL HYSTERECTOMY     Comment:  partial (fallopian adn uterus removed) 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 No date: CESAREAN SECTION 07/04/2017: CYSTOSCOPY; N/A     Comment:  Procedure: CYSTOSCOPY;  Surgeon: Nadara Mustard, MD;                Location: [facility];  Service: Gynecology;  Laterality:               N/A; 10/12/2016: ESOPHAGOGASTRODUODENOSCOPY (EGD) WITH PROPOFOL; N/A     Comment:  Procedure: ESOPHAGOGASTRODUODENOSCOPY (EGD) WITH               PROPOFOL;  Surgeon: Wyline Mood, MD;  Location: [facility]               ENDOSCOPY;  Service: Endoscopy;  Laterality: N/A; 07/04/2017: LAPAROSCOPIC HYSTERECTOMY; Bilateral     Comment:  Procedure:  HYSTERECTOMY TOTAL LAPAROSCOPIC BILATERAL               SALPINGECTOMY;  Surgeon: Nadara Mustard, MD;  Location:              [facility];  Service: Gynecology;  Laterality: Bilateral;  BMI    Body Mass Index: 42.70 kg/m      Reproductive/Obstetrics negative OB ROS                             Anesthesia Physical Anesthesia Plan  ASA: 3  Anesthesia Plan: General   Post-op Pain Management: Minimal or no pain anticipated   Induction: Intravenous  PONV Risk Score and Plan: 2 and Propofol infusion and TIVA  Airway Management Planned: Natural Airway and Nasal Cannula  Additional Equipment:   Intra-op Plan:   Post-operative Plan:   Informed Consent: I have reviewed the patients History and Physical, chart, labs and discussed the procedure including the risks, benefits and alternatives for the proposed anesthesia with the patient or authorized representative who has indicated his/her understanding and acceptance.     Dental Advisory Given  Plan Discussed with: Anesthesiologist, CRNA and Surgeon  Anesthesia Plan Comments: (Patient consented for risks of anesthesia including but not limited to:  - adverse reactions to medications - risk of airway placement if required - damage to eyes, teeth, lips or other oral  mucosa - nerve damage due to positioning  - sore throat or hoarseness - Damage to heart, brain, nerves, lungs, other parts of body or loss of life  Patient voiced understanding and assent.)        Anesthesia Quick Evaluation

## 2023-07-03 NOTE — Transfer of Care (Signed)
Immediate Anesthesia Transfer of Care Note  Patient: Virginia Peterson  Procedure(s) Performed: COLONOSCOPY WITH PROPOFOL ESOPHAGOGASTRODUODENOSCOPY (EGD) WITH PROPOFOL BIOPSY  Patient Location: PACU  Anesthesia Type:General  Level of Consciousness: sedated  Airway & Oxygen Therapy: Patient Spontanous Breathing and Patient connected to nasal cannula oxygen  Post-op Assessment: Report given to RN and Post -op Vital signs reviewed and stable  Post vital signs: Reviewed and stable  Last Vitals:  Vitals Value Taken Time  BP 94/55 07/03/23 1113  Temp 36.1 C 07/03/23 1110  Pulse 77 07/03/23 1113  Resp 18 07/03/23 1110  SpO2 97 % 07/03/23 1113  Vitals shown include unfiled device data.  Last Pain:  Vitals:   07/03/23 1110  TempSrc: Temporal  PainSc: Asleep         Complications: No notable events documented.

## 2023-07-03 NOTE — Op Note (Signed)
[facility] Patient Name: Virginia Peterson Procedure Date: 07/03/2023 10:41 AM MRN: [id number] Account #: [id number] Date of Birth: [birth year] Admit Type: Outpatient Age: 48 Room: [facility] ENDO ROOM 2 Gender: Female Note Status: Finalized Instrument Name: Prentice Docker [id number] Procedure:             Colonoscopy Indications:           Screening for colorectal malignant neoplasm Providers:             Royce Macadamia K. Norma Fredrickson MD, MD Referring MD:          Duane Lope. Judithann Sheen, MD (Referring MD) Medicines:             Propofol per Anesthesia Complications:         No immediate complications. Estimated blood loss:                         Minimal. Procedure:             Pre-Anesthesia Assessment:                        - The risks and benefits of the procedure and the                         sedation options and risks were discussed with the                         patient. All questions were answered and informed                         consent was obtained.                        - Patient identification and proposed procedure were                         verified prior to the procedure by the nurse. The                         procedure was verified in the procedure room.                        - ASA Grade Assessment: III - A patient with severe                         systemic disease.                        - After reviewing the risks and benefits, the patient                         was deemed in satisfactory condition to undergo the                         procedure.                        After obtaining informed consent, the colonoscope was                         passed under direct  vision. Throughout the procedure,                         the patient's blood pressure, pulse, and oxygen                         saturations were monitored continuously. The                         Colonoscope was introduced through the anus and                         advanced to  the the cecum, identified by appendiceal                         orifice and ileocecal valve. The colonoscopy was                         performed without difficulty. The patient tolerated                         the procedure well. The quality of the bowel                         preparation was good. The ileocecal valve, appendiceal                         orifice, and rectum were photographed. Findings:      The perianal and digital rectal examinations were normal. Pertinent       negatives include normal sphincter tone and no palpable rectal lesions.      Non-bleeding internal hemorrhoids were found during retroflexion. The       hemorrhoids were Grade I (internal hemorrhoids that do not prolapse).      A localized area of mildly erythematous mucosa was found in the sigmoid       colon. Biopsies were taken with a cold forceps for histology. Estimated       blood loss was minimal.      The exam was otherwise without abnormality. Impression:            - Non-bleeding internal hemorrhoids.                        - Erythematous mucosa in the sigmoid colon. Biopsied.                        - The examination was otherwise normal. Recommendation:        - Patient has a contact number available for                         emergencies. The signs and symptoms of potential                         delayed complications were discussed with the patient.                         Return to normal activities tomorrow. Written  discharge instructions were provided to the patient.                        - Resume previous diet.                        - Continue present medications.                        - Await pathology results.                        - Repeat colonoscopy in 10 years for screening                         purposes.                        - Return to GI office in 6 months.                        - The findings and recommendations were discussed with                          the patient. Procedure Code(s):     --- Professional ---                        [phone], Colonoscopy, flexible; with biopsy, single or                         multiple Diagnosis Code(s):     --- Professional ---                        K64.0, First degree hemorrhoids                        K63.89, Other specified diseases of intestine                        Z12.11, Encounter for screening for malignant neoplasm                         of colon CPT copyright 2022 [employer]. All rights reserved. The codes documented in this report are preliminary and upon coder review may  be revised to meet current compliance requirements. Stanton Kidney MD, MD 07/03/2023 11:09:07 AM This report has been signed electronically. Number of Addenda: 0 Note Initiated On: 07/03/2023 10:41 AM Scope Withdrawal Time: 0 hours 6 minutes 14 seconds  Total Procedure Duration: 0 hours 8 minutes 13 seconds  Estimated Blood Loss:  Estimated blood loss was minimal.      [facility]

## 2023-07-04 LAB — SURGICAL PATHOLOGY

## 2023-07-04 NOTE — Anesthesia Postprocedure Evaluation (Signed)
Anesthesia Post Note  Patient: Virginia Peterson  Procedure(s) Performed: COLONOSCOPY WITH PROPOFOL ESOPHAGOGASTRODUODENOSCOPY (EGD) WITH PROPOFOL BIOPSY  Patient location during evaluation: Endoscopy Anesthesia Type: General Level of consciousness: awake and alert Pain management: pain level controlled Vital Signs Assessment: post-procedure vital signs reviewed and stable Respiratory status: spontaneous breathing, nonlabored ventilation, respiratory function stable and patient connected to nasal cannula oxygen Cardiovascular status: blood pressure returned to baseline and stable Postop Assessment: no apparent nausea or vomiting Anesthetic complications: no   No notable events documented.   Last Vitals:  Vitals:   07/03/23 1120 07/03/23 1130  BP:  99/73  Pulse:    Resp: 18 18  Temp:    SpO2:      Last Pain:  Vitals:   07/04/23 0735  TempSrc:   PainSc: 0-No pain                 Louie Boston

## 2023-08-09 ENCOUNTER — Other Ambulatory Visit: Payer: Self-pay | Admitting: Internal Medicine

## 2023-08-09 DIAGNOSIS — Z1231 Encounter for screening mammogram for malignant neoplasm of breast: Secondary | ICD-10-CM

## 2023-10-03 ENCOUNTER — Ambulatory Visit
Admission: RE | Admit: 2023-10-03 | Discharge: 2023-10-03 | Disposition: A | Discharge status: Home / Self Care | Payer: MEDICAID | Source: Ambulatory Visit | Attending: Internal Medicine | Admitting: Internal Medicine

## 2023-10-03 DIAGNOSIS — Z1231 Encounter for screening mammogram for malignant neoplasm of breast: Secondary | ICD-10-CM | POA: Diagnosis present

## 2023-10-08 ENCOUNTER — Other Ambulatory Visit: Payer: Self-pay | Admitting: Internal Medicine

## 2023-10-08 DIAGNOSIS — R928 Other abnormal and inconclusive findings on diagnostic imaging of breast: Secondary | ICD-10-CM

## 2023-10-09 ENCOUNTER — Ambulatory Visit
Admission: RE | Admit: 2023-10-09 | Discharge: 2023-10-09 | Disposition: A | Discharge status: Home / Self Care | Payer: MEDICAID | Source: Ambulatory Visit | Attending: Internal Medicine | Admitting: Internal Medicine

## 2023-10-09 DIAGNOSIS — R928 Other abnormal and inconclusive findings on diagnostic imaging of breast: Secondary | ICD-10-CM | POA: Diagnosis present

## 2024-06-27 ENCOUNTER — Other Ambulatory Visit: Payer: Self-pay

## 2024-06-27 ENCOUNTER — Emergency Department: Payer: MEDICAID

## 2024-06-27 ENCOUNTER — Emergency Department
Admission: EM | Admit: 2024-06-27 | Discharge: 2024-06-27 | Disposition: A | Discharge status: Home / Self Care | Payer: MEDICAID | Attending: Emergency Medicine | Admitting: Emergency Medicine

## 2024-06-27 DIAGNOSIS — I10 Essential (primary) hypertension: Secondary | ICD-10-CM | POA: Diagnosis not present

## 2024-06-27 DIAGNOSIS — R059 Cough, unspecified: Secondary | ICD-10-CM | POA: Diagnosis present

## 2024-06-27 DIAGNOSIS — J45909 Unspecified asthma, uncomplicated: Secondary | ICD-10-CM | POA: Insufficient documentation

## 2024-06-27 DIAGNOSIS — E119 Type 2 diabetes mellitus without complications: Secondary | ICD-10-CM | POA: Insufficient documentation

## 2024-06-27 DIAGNOSIS — Z8616 Personal history of COVID-19: Secondary | ICD-10-CM | POA: Diagnosis not present

## 2024-06-27 DIAGNOSIS — J069 Acute upper respiratory infection, unspecified: Secondary | ICD-10-CM | POA: Diagnosis not present

## 2024-06-27 LAB — RESP PANEL BY RT-PCR (RSV, FLU A&B, COVID)  RVPGX2
Influenza A by PCR: NEGATIVE
Influenza B by PCR: NEGATIVE
Resp Syncytial Virus by PCR: NEGATIVE
SARS Coronavirus 2 by RT PCR: NEGATIVE

## 2024-06-27 LAB — GROUP A STREP BY PCR: Group A Strep by PCR: NOT DETECTED

## 2024-06-27 MED ORDER — ACETAMINOPHEN 325 MG PO TABS
650.0000 mg | ORAL_TABLET | Freq: Once | ORAL | Status: AC
  Administered 2024-06-27: 650 mg via ORAL
  Filled 2024-06-27: qty 2

## 2024-06-27 NOTE — ED Triage Notes (Addendum)
 Pt arrived via POV with c/o flu like symptoms that started 2 days ago. Was seen and had a resp panel done yesterday and everything was negative but was given a zpak and took the first dose last night and did not take this mornings dose. Pt reports not being able to sleep, coughing like crazy and feels like crap. Pt was using ChatGPT to figure out what they should try next and came to ER for help. Pt is A&Ox4 and ambulatory during triage.

## 2024-06-27 NOTE — Discharge Instructions (Signed)
 Your swabs and chest x-ray are normal.  You likely have a viral etiology of your symptoms today.  Please follow-up with your outpatient provider.  Please return for any new, worsening, or change in symptoms or other concerns.  It was a pleasure caring for you today.

## 2024-06-27 NOTE — ED Provider Notes (Signed)
 "  [facility] Provider Note    Event Date/Time   First MD Initiated Contact with Patient 06/27/24 1040     (approximate)   History   flu like symptoms   HPI  Terrell Shimko is a 49 y.o. female with a past medical history of morbid obesity, asthma, type 2 diabetes, hyperlipidemia, hypertension, chronic pain syndrome, depression, anxiety who presents today for evaluation of cough.  Patient reports that her child was diagnosed with the flu 2 days ago, and patient's mother is also sick with similar symptoms.  Patient reports that she saw her PCP yesterday and had a negative flu swab, though was given a Z-Pak for her cough.  She does not feel that this is helping, and she reports that she was coughing all night and also had nasal congestion which kept her from sleeping.  She denies chest pain.  She does not feel particularly short of breath unless she is coughing.  She also reports that she has a sore throat.  No fevers or chills.  Patient Active Problem List   Diagnosis Date Noted   COVID-19 08/01/2019   Diarrhea 08/01/2019   Pelvic pain in female 06/28/2017   Morbid obesity with BMI of 40.0-44.9, adult (HCC) 10/03/2016   Asthma without status asthmaticus 07/20/2016   Diabetes mellitus type 2, uncomplicated (HCC) 07/20/2016   Disc disease, degenerative, lumbar or lumbosacral 07/20/2016   Generalized anxiety disorder 07/20/2016   Hyperlipidemia, unspecified 07/20/2016   Hypertension 07/20/2016   Pain syndrome, chronic 07/20/2016   Depression 10/06/2015   Adjustment disorder with mixed anxiety and depressed mood 10/06/2015          Physical Exam   Triage Vital Signs: ED Triage Vitals  Encounter Vitals Group     BP 06/27/24 0938 (!) 142/85     Girls Systolic BP Percentile --      Girls Diastolic BP Percentile --      Boys Systolic BP Percentile --      Boys Diastolic BP Percentile --      Pulse Rate 06/27/24 0938 100     Resp 06/27/24 0938 17     Temp  06/27/24 0938 98.8 F (37.1 C)     Temp Source 06/27/24 0938 Oral     SpO2 06/27/24 0938 98 %     Weight 06/27/24 0940 210 lb (95.3 kg)     Height 06/27/24 0940 5' 4 (1.626 m)     Head Circumference --      Peak Flow --      Pain Score 06/27/24 0938 9     Pain Loc --      Pain Education --      Exclude from Growth Chart --     Most recent vital signs: Vitals:   06/27/24 0938  BP: (!) 142/85  Pulse: 100  Resp: 17  Temp: 98.8 F (37.1 C)  SpO2: 98%    Physical Exam Vitals and nursing note reviewed.  Constitutional:      General: Awake and alert. No acute distress.    Appearance: Normal appearance.  HENT:     Head: Normocephalic and atraumatic.     Mouth: Mucous membranes are moist. Uvula midline.  No tonsillar exudate.  No soft palate fluctuance.  No trismus.  No voice change.  No sublingual swelling.  No tender cervical lymphadenopathy.  No nuchal rigidity Eyes:     General: PERRL. Normal EOMs        Right eye: No discharge.  Left eye: No discharge.     Conjunctiva/sclera: Conjunctivae normal.  Cardiovascular:     Rate and Rhythm: Normal rate and regular rhythm.     Pulses: Normal pulses.  Pulmonary:     Effort: Pulmonary effort is normal. No respiratory distress.  Able to speak easily in complete sentences    Breath sounds: Normal breath sounds.  No wheezes or rhonchi Abdominal:     Abdomen is soft. There is no abdominal tenderness. No rebound or guarding. No distention. Musculoskeletal:        General: No swelling. Normal range of motion.     Cervical back: Normal range of motion and neck supple.  Skin:    General: Skin is warm and dry.     Capillary Refill: Capillary refill takes less than 2 seconds.     Findings: No rash.  Neurological:     Mental Status: The patient is awake and alert.      ED Results / Procedures / Treatments   Labs (all labs ordered are listed, but only abnormal results are displayed) Labs Reviewed  RESP PANEL BY RT-PCR  (RSV, FLU A&B, COVID)  [id number]  GROUP A STREP BY PCR     EKG     RADIOLOGY I independently reviewed and interpreted imaging and agree with radiologists findings.     PROCEDURES:  Critical Care performed:   Procedures   MEDICATIONS ORDERED IN ED: Medications  acetaminophen  (TYLENOL ) tablet 650 mg (650 mg Oral Given 06/27/24 1119)     IMPRESSION / MDM / ASSESSMENT AND PLAN / ED COURSE  I reviewed the triage vital signs and the nursing notes.   Differential diagnosis includes, but is not limited to, influenza, COVID-19, strep pharyngitis, bronchitis, pneumonia, asthma exacerbation.  I reviewed the patient's chart.  Patient saw her primary care provider on 06/12/2024.  Patient presents to the emergency department awake and alert, hemodynamically stable and afebrile.  She is able to speak easily in complete sentences.  Her lungs are clear to auscultation bilaterally.  Chest x-ray obtained given her cough and was negative for cardiopulmonary abnormality.  COVID/flu swab also obtained as well as strep swab given her sore throat.  Chest x-ray and swabs were reassuring.  Symptoms are most consistent with other viral etiology.  We discussed symptomatic management and return precautions.  Patient understands and agrees with plan.  She was discharged in stable condition.  Patient's presentation is most consistent with acute complicated illness / injury requiring diagnostic workup.    FINAL CLINICAL IMPRESSION(S) / ED DIAGNOSES   Final diagnoses:  Viral URI with cough     Rx / DC Orders   ED Discharge Orders     None        Note:  This document was prepared using Dragon voice recognition software and may include unintentional dictation errors.   Lylliana Kitamura E, PA-C 06/27/24 1323    Dorothyann Drivers, MD 06/27/24 1354  "

## 2024-06-30 ENCOUNTER — Emergency Department
Admission: EM | Admit: 2024-06-30 | Discharge: 2024-06-30 | Discharge status: Against Medical Advice | Payer: MEDICAID | Attending: Emergency Medicine | Admitting: Emergency Medicine

## 2024-06-30 ENCOUNTER — Other Ambulatory Visit: Payer: Self-pay

## 2024-06-30 DIAGNOSIS — Z5321 Procedure and treatment not carried out due to patient leaving prior to being seen by health care provider: Secondary | ICD-10-CM | POA: Insufficient documentation

## 2024-06-30 DIAGNOSIS — R251 Tremor, unspecified: Secondary | ICD-10-CM | POA: Insufficient documentation

## 2024-06-30 NOTE — ED Triage Notes (Signed)
 Pt to ED via EMS from home, pt was prescribed doxycycline and prednisone  today, pt reports her hands began shaking after taking 1 prednisone . Pt reports she has taken prednisone  in the past without any symptoms. Pt is not shaking at this time.
